# Patient Record
Sex: Female | Born: 1945 | Race: White | Hispanic: No | Smoking: Never smoker
Health system: Southern US, Community
[De-identification: ages and names within clinical notes are randomized; demographics above are authoritative.]

## PROBLEM LIST (undated history)

## (undated) DIAGNOSIS — I4891 Unspecified atrial fibrillation: Secondary | ICD-10-CM

## (undated) DIAGNOSIS — Z853 Personal history of malignant neoplasm of breast: Secondary | ICD-10-CM

## (undated) DIAGNOSIS — C801 Malignant (primary) neoplasm, unspecified: Secondary | ICD-10-CM

## (undated) DIAGNOSIS — R002 Palpitations: Secondary | ICD-10-CM

## (undated) DIAGNOSIS — I499 Cardiac arrhythmia, unspecified: Secondary | ICD-10-CM

## (undated) DIAGNOSIS — M1990 Unspecified osteoarthritis, unspecified site: Secondary | ICD-10-CM

## (undated) DIAGNOSIS — F419 Anxiety disorder, unspecified: Secondary | ICD-10-CM

## (undated) DIAGNOSIS — E785 Hyperlipidemia, unspecified: Secondary | ICD-10-CM

## (undated) DIAGNOSIS — E669 Obesity, unspecified: Secondary | ICD-10-CM

## (undated) DIAGNOSIS — K5790 Diverticulosis of intestine, part unspecified, without perforation or abscess without bleeding: Secondary | ICD-10-CM

## (undated) DIAGNOSIS — Z9889 Other specified postprocedural states: Secondary | ICD-10-CM

## (undated) DIAGNOSIS — R112 Nausea with vomiting, unspecified: Secondary | ICD-10-CM

## (undated) DIAGNOSIS — T8859XA Other complications of anesthesia, initial encounter: Secondary | ICD-10-CM

## (undated) DIAGNOSIS — K635 Polyp of colon: Secondary | ICD-10-CM

## (undated) HISTORY — DX: Polyp of colon: K63.5

## (undated) HISTORY — PX: TOTAL ABDOMINAL HYSTERECTOMY W/ BILATERAL SALPINGOOPHORECTOMY: SHX83

## (undated) HISTORY — DX: Personal history of malignant neoplasm of breast: Z85.3

## (undated) HISTORY — PX: OTHER SURGICAL HISTORY: SHX169

## (undated) HISTORY — DX: Unspecified osteoarthritis, unspecified site: M19.90

## (undated) HISTORY — PX: BREAST BIOPSY: SHX20

## (undated) HISTORY — DX: Malignant (primary) neoplasm, unspecified: C80.1

## (undated) HISTORY — PX: CT RADIATION THERAPY GUIDE: HXRAD513

## (undated) HISTORY — DX: Diverticulosis of intestine, part unspecified, without perforation or abscess without bleeding: K57.90

## (undated) HISTORY — DX: Anxiety disorder, unspecified: F41.9

## (undated) HISTORY — DX: Hyperlipidemia, unspecified: E78.5

## (undated) HISTORY — PX: CHOLECYSTECTOMY: SHX55

## (undated) HISTORY — DX: Obesity, unspecified: E66.9

## (undated) HISTORY — PX: ABDOMINAL HYSTERECTOMY: SHX81

## (undated) HISTORY — DX: Palpitations: R00.2

## (undated) HISTORY — PX: KNEE ARTHROSCOPY: SUR90

---

## 1999-08-20 ENCOUNTER — Other Ambulatory Visit: Admission: RE | Admit: 1999-08-20 | Discharge: 1999-08-20 | Payer: Self-pay | Admitting: Obstetrics and Gynecology

## 2000-04-29 ENCOUNTER — Encounter: Payer: Self-pay | Admitting: Internal Medicine

## 2000-10-27 ENCOUNTER — Other Ambulatory Visit: Admission: RE | Admit: 2000-10-27 | Discharge: 2000-10-27 | Payer: Self-pay | Admitting: Obstetrics and Gynecology

## 2002-01-25 ENCOUNTER — Other Ambulatory Visit: Admission: RE | Admit: 2002-01-25 | Discharge: 2002-01-25 | Payer: Self-pay | Admitting: Obstetrics and Gynecology

## 2002-08-04 ENCOUNTER — Ambulatory Visit (HOSPITAL_COMMUNITY): Admission: RE | Admit: 2002-08-04 | Discharge: 2002-08-04 | Payer: Self-pay | Admitting: Obstetrics and Gynecology

## 2002-08-04 ENCOUNTER — Encounter (INDEPENDENT_AMBULATORY_CARE_PROVIDER_SITE_OTHER): Payer: Self-pay | Admitting: *Deleted

## 2003-04-24 ENCOUNTER — Ambulatory Visit (HOSPITAL_BASED_OUTPATIENT_CLINIC_OR_DEPARTMENT_OTHER): Admission: RE | Admit: 2003-04-24 | Discharge: 2003-04-24 | Payer: Self-pay | Admitting: Orthopedic Surgery

## 2004-04-10 ENCOUNTER — Other Ambulatory Visit: Admission: RE | Admit: 2004-04-10 | Discharge: 2004-04-10 | Payer: Self-pay | Admitting: Obstetrics and Gynecology

## 2005-05-13 ENCOUNTER — Other Ambulatory Visit: Admission: RE | Admit: 2005-05-13 | Discharge: 2005-05-13 | Payer: Self-pay | Admitting: Obstetrics and Gynecology

## 2006-11-23 HISTORY — PX: OOPHORECTOMY: SHX86

## 2007-06-17 ENCOUNTER — Encounter: Admission: RE | Admit: 2007-06-17 | Discharge: 2007-06-17 | Payer: Self-pay | Admitting: General Surgery

## 2007-06-17 ENCOUNTER — Ambulatory Visit (HOSPITAL_COMMUNITY): Admission: RE | Admit: 2007-06-17 | Discharge: 2007-06-17 | Payer: Self-pay | Admitting: General Surgery

## 2007-06-23 ENCOUNTER — Encounter (INDEPENDENT_AMBULATORY_CARE_PROVIDER_SITE_OTHER): Payer: Self-pay | Admitting: Diagnostic Radiology

## 2007-06-23 ENCOUNTER — Encounter: Payer: Self-pay | Admitting: Internal Medicine

## 2007-06-23 ENCOUNTER — Encounter: Admission: RE | Admit: 2007-06-23 | Discharge: 2007-06-23 | Payer: Self-pay | Admitting: Obstetrics and Gynecology

## 2007-07-26 ENCOUNTER — Encounter: Admission: RE | Admit: 2007-07-26 | Discharge: 2007-07-26 | Payer: Self-pay | Admitting: General Surgery

## 2007-07-26 ENCOUNTER — Encounter (INDEPENDENT_AMBULATORY_CARE_PROVIDER_SITE_OTHER): Payer: Self-pay | Admitting: General Surgery

## 2007-07-26 ENCOUNTER — Ambulatory Visit (HOSPITAL_COMMUNITY): Admission: RE | Admit: 2007-07-26 | Discharge: 2007-07-27 | Payer: Self-pay | Admitting: General Surgery

## 2007-07-26 ENCOUNTER — Encounter: Payer: Self-pay | Admitting: Internal Medicine

## 2007-07-26 HISTORY — PX: BREAST LUMPECTOMY: SHX2

## 2007-08-01 ENCOUNTER — Encounter (INDEPENDENT_AMBULATORY_CARE_PROVIDER_SITE_OTHER): Payer: Self-pay | Admitting: General Surgery

## 2007-08-02 ENCOUNTER — Ambulatory Visit: Payer: Self-pay | Admitting: Oncology

## 2007-08-17 LAB — COMPREHENSIVE METABOLIC PANEL
AST: 20 U/L (ref 0–37)
Albumin: 4.4 g/dL (ref 3.5–5.2)
BUN: 16 mg/dL (ref 6–23)
Calcium: 9.7 mg/dL (ref 8.4–10.5)
Chloride: 104 mEq/L (ref 96–112)
Creatinine, Ser: 0.75 mg/dL (ref 0.40–1.20)
Glucose, Bld: 83 mg/dL (ref 70–99)
Potassium: 4.2 mEq/L (ref 3.5–5.3)

## 2007-08-17 LAB — CBC WITH DIFFERENTIAL/PLATELET
Basophils Absolute: 0.1 10*3/uL (ref 0.0–0.1)
EOS%: 7 % (ref 0.0–7.0)
Eosinophils Absolute: 0.4 10*3/uL (ref 0.0–0.5)
HCT: 40 % (ref 34.8–46.6)
HGB: 14.2 g/dL (ref 11.6–15.9)
MCH: 31.8 pg (ref 26.0–34.0)
MCV: 89.4 fL (ref 81.0–101.0)
MONO%: 6.4 % (ref 0.0–13.0)
NEUT#: 2.1 10*3/uL (ref 1.5–6.5)
NEUT%: 42.7 % (ref 39.6–76.8)
RDW: 13.6 % (ref 11.3–14.5)
lymph#: 2.1 10*3/uL (ref 0.9–3.3)

## 2007-08-17 LAB — CANCER ANTIGEN 27.29: CA 27.29: 19 U/mL (ref 0–39)

## 2007-08-23 ENCOUNTER — Ambulatory Visit: Admission: RE | Admit: 2007-08-23 | Discharge: 2007-11-01 | Payer: Self-pay | Admitting: Radiation Oncology

## 2007-12-01 ENCOUNTER — Ambulatory Visit: Payer: Self-pay | Admitting: Oncology

## 2007-12-05 LAB — CBC WITH DIFFERENTIAL/PLATELET
Basophils Absolute: 0 10*3/uL (ref 0.0–0.1)
EOS%: 3.6 % (ref 0.0–7.0)
Eosinophils Absolute: 0.1 10*3/uL (ref 0.0–0.5)
HGB: 14.5 g/dL (ref 11.6–15.9)
MONO#: 0.3 10*3/uL (ref 0.1–0.9)
NEUT#: 1.9 10*3/uL (ref 1.5–6.5)
RDW: 13.1 % (ref 11.3–14.5)
WBC: 3.4 10*3/uL — ABNORMAL LOW (ref 3.9–10.0)
lymph#: 1.1 10*3/uL (ref 0.9–3.3)

## 2007-12-05 LAB — COMPREHENSIVE METABOLIC PANEL
AST: 21 U/L (ref 0–37)
Albumin: 4.3 g/dL (ref 3.5–5.2)
BUN: 13 mg/dL (ref 6–23)
Calcium: 9.6 mg/dL (ref 8.4–10.5)
Chloride: 105 mEq/L (ref 96–112)
Potassium: 4.1 mEq/L (ref 3.5–5.3)

## 2007-12-11 LAB — VITAMIN D PNL(25-HYDRXY+1,25-DIHY)-BLD: Vit D, 25-Hydroxy: 20 ng/mL — ABNORMAL LOW (ref 30–89)

## 2008-01-30 ENCOUNTER — Encounter: Payer: Self-pay | Admitting: Internal Medicine

## 2008-01-31 ENCOUNTER — Ambulatory Visit: Payer: Self-pay | Admitting: Oncology

## 2008-02-02 LAB — CBC WITH DIFFERENTIAL/PLATELET
BASO%: 0.5 % (ref 0.0–2.0)
EOS%: 3.1 % (ref 0.0–7.0)
Eosinophils Absolute: 0.2 10*3/uL (ref 0.0–0.5)
HGB: 14.5 g/dL (ref 11.6–15.9)
LYMPH%: 27.8 % (ref 14.0–48.0)
MCV: 88.5 fL (ref 81.0–101.0)
NEUT#: 3 10*3/uL (ref 1.5–6.5)
NEUT%: 61.4 % (ref 39.6–76.8)
RDW: 13.5 % (ref 11.3–14.5)

## 2008-02-02 LAB — COMPREHENSIVE METABOLIC PANEL
ALT: 26 U/L (ref 0–35)
AST: 24 U/L (ref 0–37)
CO2: 29 mEq/L (ref 19–32)
Creatinine, Ser: 0.73 mg/dL (ref 0.40–1.20)
Sodium: 140 mEq/L (ref 135–145)
Total Bilirubin: 0.7 mg/dL (ref 0.3–1.2)
Total Protein: 7.1 g/dL (ref 6.0–8.3)

## 2008-02-03 LAB — VITAMIN D 25 HYDROXY (VIT D DEFICIENCY, FRACTURES): Vit D, 25-Hydroxy: 21 ng/mL — ABNORMAL LOW (ref 30–89)

## 2008-05-01 ENCOUNTER — Ambulatory Visit: Payer: Self-pay | Admitting: Oncology

## 2008-05-07 ENCOUNTER — Encounter: Admission: RE | Admit: 2008-05-07 | Discharge: 2008-05-07 | Payer: Self-pay | Admitting: General Surgery

## 2008-06-15 ENCOUNTER — Ambulatory Visit: Payer: Self-pay | Admitting: Oncology

## 2008-06-20 LAB — CBC WITH DIFFERENTIAL/PLATELET
Basophils Absolute: 0 10*3/uL (ref 0.0–0.1)
Eosinophils Absolute: 0 10*3/uL (ref 0.0–0.5)
HGB: 14.4 g/dL (ref 11.6–15.9)
LYMPH%: 28.2 % (ref 14.0–48.0)
MCV: 91.3 fL (ref 81.0–101.0)
MONO%: 6.7 % (ref 0.0–13.0)
NEUT#: 2.8 10*3/uL (ref 1.5–6.5)
Platelets: 238 10*3/uL (ref 145–400)
RBC: 4.6 10*6/uL (ref 3.70–5.32)

## 2008-06-20 LAB — LIPID PANEL
Cholesterol: 274 mg/dL — ABNORMAL HIGH (ref 0–200)
VLDL: 61 mg/dL — ABNORMAL HIGH (ref 0–40)

## 2008-06-21 LAB — COMPREHENSIVE METABOLIC PANEL
Alkaline Phosphatase: 50 U/L (ref 39–117)
BUN: 16 mg/dL (ref 6–23)
Creatinine, Ser: 0.79 mg/dL (ref 0.40–1.20)
Glucose, Bld: 90 mg/dL (ref 70–99)
Total Bilirubin: 0.4 mg/dL (ref 0.3–1.2)

## 2008-06-21 LAB — VITAMIN D 25 HYDROXY (VIT D DEFICIENCY, FRACTURES): Vit D, 25-Hydroxy: 23 ng/mL — ABNORMAL LOW (ref 30–89)

## 2008-06-21 LAB — CANCER ANTIGEN 27.29: CA 27.29: 21 U/mL (ref 0–39)

## 2008-12-18 ENCOUNTER — Ambulatory Visit: Payer: Self-pay | Admitting: Oncology

## 2008-12-20 LAB — CBC WITH DIFFERENTIAL/PLATELET
Basophils Absolute: 0 10*3/uL (ref 0.0–0.1)
Eosinophils Absolute: 0.1 10*3/uL (ref 0.0–0.5)
HCT: 41.6 % (ref 34.8–46.6)
LYMPH%: 32.4 % (ref 14.0–48.0)
MCV: 90.1 fL (ref 81.0–101.0)
MONO%: 7.8 % (ref 0.0–13.0)
NEUT#: 2.3 10*3/uL (ref 1.5–6.5)
NEUT%: 56.7 % (ref 39.6–76.8)
Platelets: 219 10*3/uL (ref 145–400)
RBC: 4.62 10*6/uL (ref 3.70–5.32)

## 2008-12-21 LAB — COMPREHENSIVE METABOLIC PANEL
ALT: 21 U/L (ref 0–35)
AST: 21 U/L (ref 0–37)
Calcium: 9.2 mg/dL (ref 8.4–10.5)
Chloride: 103 mEq/L (ref 96–112)
Creatinine, Ser: 0.81 mg/dL (ref 0.40–1.20)
Sodium: 139 mEq/L (ref 135–145)
Total Protein: 7 g/dL (ref 6.0–8.3)

## 2009-05-08 ENCOUNTER — Encounter: Admission: RE | Admit: 2009-05-08 | Discharge: 2009-05-08 | Payer: Self-pay | Admitting: General Surgery

## 2009-11-23 HISTORY — PX: BREAST BIOPSY: SHX20

## 2010-01-17 ENCOUNTER — Ambulatory Visit: Payer: Self-pay | Admitting: Oncology

## 2010-01-21 ENCOUNTER — Encounter: Payer: Self-pay | Admitting: Internal Medicine

## 2010-01-21 LAB — CBC WITH DIFFERENTIAL/PLATELET
Basophils Absolute: 0 10*3/uL (ref 0.0–0.1)
Eosinophils Absolute: 0.1 10*3/uL (ref 0.0–0.5)
HGB: 14.5 g/dL (ref 11.6–15.9)
MCHC: 34 g/dL (ref 31.5–36.0)
MONO#: 0.5 10*3/uL (ref 0.1–0.9)
MONO%: 7 % (ref 0.0–14.0)
RDW: 14.4 % (ref 11.2–14.5)
WBC: 6.7 10*3/uL (ref 3.9–10.3)
lymph#: 1.9 10*3/uL (ref 0.9–3.3)

## 2010-01-21 LAB — COMPREHENSIVE METABOLIC PANEL
Alkaline Phosphatase: 71 U/L (ref 39–117)
BUN: 17 mg/dL (ref 6–23)
Glucose, Bld: 86 mg/dL (ref 70–99)
Total Bilirubin: 0.4 mg/dL (ref 0.3–1.2)

## 2010-01-24 ENCOUNTER — Encounter: Payer: Self-pay | Admitting: Internal Medicine

## 2010-01-25 ENCOUNTER — Encounter: Payer: Self-pay | Admitting: Internal Medicine

## 2010-01-27 ENCOUNTER — Encounter: Payer: Self-pay | Admitting: Internal Medicine

## 2010-03-27 DIAGNOSIS — R002 Palpitations: Secondary | ICD-10-CM | POA: Insufficient documentation

## 2010-03-28 ENCOUNTER — Ambulatory Visit: Payer: Self-pay | Admitting: Internal Medicine

## 2010-03-28 DIAGNOSIS — E785 Hyperlipidemia, unspecified: Secondary | ICD-10-CM | POA: Insufficient documentation

## 2010-04-01 ENCOUNTER — Encounter: Payer: Self-pay | Admitting: Internal Medicine

## 2010-04-09 ENCOUNTER — Telehealth: Payer: Self-pay | Admitting: Internal Medicine

## 2010-04-14 ENCOUNTER — Encounter: Payer: Self-pay | Admitting: Internal Medicine

## 2010-04-16 ENCOUNTER — Encounter: Payer: Self-pay | Admitting: Internal Medicine

## 2010-04-16 ENCOUNTER — Ambulatory Visit: Payer: Self-pay

## 2010-05-12 ENCOUNTER — Encounter: Admission: RE | Admit: 2010-05-12 | Discharge: 2010-05-12 | Payer: Self-pay | Admitting: General Surgery

## 2010-05-16 ENCOUNTER — Encounter: Admission: RE | Admit: 2010-05-16 | Discharge: 2010-05-16 | Payer: Self-pay | Admitting: General Surgery

## 2010-05-29 ENCOUNTER — Encounter: Admission: RE | Admit: 2010-05-29 | Discharge: 2010-05-29 | Payer: Self-pay | Admitting: General Surgery

## 2010-05-30 ENCOUNTER — Telehealth: Payer: Self-pay | Admitting: Internal Medicine

## 2010-06-20 ENCOUNTER — Encounter (INDEPENDENT_AMBULATORY_CARE_PROVIDER_SITE_OTHER): Payer: Self-pay | Admitting: *Deleted

## 2010-08-19 ENCOUNTER — Encounter: Payer: Self-pay | Admitting: Internal Medicine

## 2010-09-18 ENCOUNTER — Ambulatory Visit: Payer: Self-pay

## 2010-09-18 ENCOUNTER — Ambulatory Visit: Payer: Self-pay | Admitting: Internal Medicine

## 2010-11-23 DIAGNOSIS — C801 Malignant (primary) neoplasm, unspecified: Secondary | ICD-10-CM

## 2010-11-23 HISTORY — PX: OTHER SURGICAL HISTORY: SHX169

## 2010-11-23 HISTORY — DX: Malignant (primary) neoplasm, unspecified: C80.1

## 2010-11-28 ENCOUNTER — Encounter: Payer: Self-pay | Admitting: Internal Medicine

## 2010-11-28 ENCOUNTER — Ambulatory Visit
Admission: RE | Admit: 2010-11-28 | Discharge: 2010-11-28 | Payer: Self-pay | Source: Home / Self Care | Attending: Internal Medicine | Admitting: Internal Medicine

## 2010-12-01 ENCOUNTER — Ambulatory Visit: Admission: RE | Admit: 2010-12-01 | Discharge: 2010-12-01 | Payer: Self-pay | Source: Home / Self Care

## 2010-12-01 ENCOUNTER — Ambulatory Visit (HOSPITAL_COMMUNITY)
Admission: RE | Admit: 2010-12-01 | Discharge: 2010-12-01 | Payer: Self-pay | Source: Home / Self Care | Attending: Internal Medicine | Admitting: Internal Medicine

## 2010-12-01 ENCOUNTER — Encounter: Payer: Self-pay | Admitting: Internal Medicine

## 2010-12-01 ENCOUNTER — Encounter (INDEPENDENT_AMBULATORY_CARE_PROVIDER_SITE_OTHER): Payer: Self-pay | Admitting: *Deleted

## 2010-12-14 ENCOUNTER — Encounter: Payer: Self-pay | Admitting: General Surgery

## 2010-12-21 LAB — CONVERTED CEMR LAB: T3, Total: 74.6 ng/dL — ABNORMAL LOW (ref 80.0–204.0)

## 2010-12-23 NOTE — Letter (Signed)
Summary: [facility]  [facility]   Imported By: Roderic Ovens 04/29/2010 12:44:19  _____________________________________________________________________  External Attachment:    Type:   Image     Comment:   External Document

## 2010-12-23 NOTE — Assessment & Plan Note (Signed)
Summary: np6/ pt has medicare, bcbs / dx: palps couple of months/gd   Referring Provider:  Dr. Darnelle Catalan Primary Provider:  Dr. 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 Referred by Dr. Darnelle Catalan for further evaluation of palpitations  Denies any h/o known heart disease. Never had or stress test or cath.   Two months ago before going to see Dr. Darnelle Catalan for f/u was having palpitations. Was nervous about seeing him. Had very brief palpitations. Worse with stress. Gets sweaty with them and can feel it in her throat. Takes pulse and HR typically in 70s. Never goes fast or gets irregular. nNo prolonged episodses. no syncope or presyncope.  No episodes for for 2 weeks now.   Previously was drinking lots of caffeine. Cut back about 2 weeks ago on Dr. Darrall Dears advice. Now drinks half and half. Palpitations much better.  Husband says she doesn't snore. No ETOH.  Walks with husband 30 minutes a day with husband without problem. Occasionally strength training at [employer].   Recent lipids TC 271 TG 141 LDL 160 HDL 82. Unable to tolerate any cholesterol meds.  Had echo in 2001 (10 years ago)  EF 65-70% mild TR, trivial MR  Current Medications (verified): 1)  Meclizine Hcl 25 Mg Chew (Meclizine Hcl) .... As Needed 2)  Calcium 600 Mg Tabs (Calcium) .... Two Times A Day 3)  Vitamin D 1000 Unit Tabs (Cholecalciferol) .... 2 Tabs Two Times A Day 4)  Ester-C 1000-50 Mg Tabs (Bioflavonoid Products) .... 1/2 Tab Two Times A Day  Allergies (verified): 1)  ! Tetracycline 2)  ! Neosporin  Past History:  Past Medical History: 1. Palpatations 2. Hx of breast cancer s/p lumpectomy and HRT 3. Hyperlipidemia, intolerant of all cholesterol meds 4. Obesity 5. Anxiery  Past Surgical History: L breast lumpectomy L knee score Cholecystectomy TAH and BSO  Family History: Reviewed history and no changes  required. Mother died 27 cancer Father died 37 cancer No family h/o premature CAD  Social History: Reviewed history and no changes required. Retired Engineer, civil (consulting). No tobacco ETOH. Married 3 kids  Review of Systems       As per HPI and past medical history; otherwise all systems negative.   Vital Signs:  Patient profile:   65 year old female Height:      63 inches Weight:      199 pounds BMI:     35.38 Pulse rate:   80 / minute BP sitting:   118 / 78  (right arm)  Vitals Entered By: Laurance Flatten CMA (Mar 28, 2010 3:41 PM)  Physical Exam  General:  Gen: well appearing. no resp difficulty HEENT: normal Neck: supple. no JVD. Carotids 2+ bilat; no bruits. No lymphadenopathy or thryomegaly appreciated. Cor: PMI nondisplaced. Regular rate & rhythm. No rubs, murmur. +s4 Lungs: clear Abdomen: soft, nontender, nondistended.  Extremities: no cyanosis, clubbing, rash, edema Neuro: alert & orientedx3, cranial nerves grossly intact. moves all 4 extremities w/o difficulty. affect pleasant    Impression & Recommendations:  Problem # 1:  PALPITATIONS (ICD-785.1) Likely PACs and PVCs. Much improved with decreasing caffeine. Reassured her that these were likley benign. If recure we will place a 2 week event monitor to further evaluate. Check TSH. Needs routine stress test for screening purposes.   Problem # 2:  HYPERLIPIDEMIA-MIXED (ICD-272.4) LDL elevated. Given intolerance to previous meds will try red yeast Mihalik 1 tablet  once daily. Can titrate to 1 two times a day.   Other Orders: T-TSH [phone]) T-T4, Free [phone]) T- * Misc. Laboratory test [phone]) Treadmill (Treadmill)  Patient Instructions: 1)  Labs today 2)  Your physician has requested that you have an exercise tolerance test.  For further information please visit [email or web address]  Please also follow instruction sheet, as given. 3)  Call if you want to get Event Monitor 4)  Start Red Yeast Cardiff 1 tab daily, can  increase to 1 tab two times a day as needed  Appended Document: np6/ pt has medicare, bcbs / dx: palps couple of months/gd NSR 80. normal axis and intervals. No ST-T wave abnormalities.

## 2010-12-23 NOTE — Progress Notes (Signed)
Summary: pt has palpitations and questions  Phone Note Call from Patient Call back at Home Phone [phone]   Caller: Patient Reason for Call: Talk to Nurse, Talk to Doctor Summary of Call: pt would like to find out about labwork,moniter and she continues to have heart palpitations. When pt woke up she was hurting in he shoulder blade and across her back she is going to see her pcp at 2pm today Initial call taken by: Omer Jack,  Apr 09, 2010 12:33 PM  Follow-up for Phone Call        spoke w/pt she saw Rhodia Albright today ? touch of pleurasy, she was started on antibiotic, continues to have palps will check on getting pt monitor asap Meredith Staggers, RN  Apr 09, 2010 5:52 PM

## 2010-12-23 NOTE — Letter (Signed)
Summary: [facility] Progress [facility]  [facility]   Imported By: Roderic Ovens 04/29/2010 12:43:06  _____________________________________________________________________  External Attachment:    Type:   Image     Comment:   External Document

## 2010-12-23 NOTE — Letter (Signed)
Summary: Appointment - Missed  [facility], Main Office  [address]   [place], [place] [address]   Phone: [phone]  Fax: [phone]     June 20, 2010 MRN: [id number]   [facility] [address] [place], [place]  [address]   Dear Ms. HENION,  Our records indicate you missed your appointment on 06/19/2010  with  Dr. Gala Romney   It is very important that we reach you to reschedule this appointment. We look forward to participating in your health care needs. Please contact us at the number listed above at your earliest convenience to reschedule this appointment.     Sincerely,   Lorne Skeens  [facility] Scheduling Team

## 2010-12-23 NOTE — Letter (Signed)
Summary: [facility]   [facility] Cardiology & Internal Medicine   Imported By: Roderic Ovens 04/23/2010 11:26:45  _____________________________________________________________________  External Attachment:    Type:   Image     Comment:   External Document

## 2010-12-23 NOTE — Progress Notes (Signed)
Summary: monitor results  Phone Note Outgoing Call   Call placed by: Meredith Staggers, RN,  May 30, 2010 3:09 PM Call placed to: Patient Summary of Call: called pt w/monitor results, SR w/occ PACs, pt aware

## 2010-12-23 NOTE — Procedures (Signed)
Summary: Summary Report  Summary Report   Imported By: Erle Crocker 06/05/2010 11:01:52  _____________________________________________________________________  External Attachment:    Type:   Image     Comment:   External Document

## 2010-12-25 NOTE — Assessment & Plan Note (Signed)
Summary: per check ou/sf   Visit Type:  Follow-up Referring Provider:  Dr. Darnelle Catalan Primary Provider:  Dr. Collier Flowers  CC:  no complaints.  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 Referred by Dr. Darnelle Catalan last year for further evaluation of palpitations and CP.   Holter in May 2011. Sinus with PACs. Thyroid panel normal.   Had ETT in 10/11. Walked 6:30 on Bruce protocol. Normal ECG. Palpitations improved with decreasing caffeine but still gets at times - much worse with stress. Palpitations resolved when husband out of town for 2 weeks.  No prolonged palpitations.  No syncope.  Not walking as much recently due to throwing out her back.  Had several episdoes of GRED-like symptoms last month and changed way she eats and now better.      Current Medications (verified): 1)  Meclizine Hcl 25 Mg Chew (Meclizine Hcl) .... As Needed 2)  Cholesterol Reduction Complex .Marland Kitchen.. 2 Once Daily  (Shaklee Brand)  Allergies (verified): 1)  ! Tetracycline 2)  ! Neosporin  Past History:  Past Medical History: 1. Palpatations 2. Hx of breast cancer s/p lumpectomy and HRT 3. Hyperlipidemia, intolerant of all cholesterol meds 4. Obesity 5. Anxiery 6. CP    --ETT 10/11: walked 630 on Bruce protocl. normal ECG  Review of Systems       As per HPI and past medical history; otherwise all systems negative.   Vital Signs:  Patient profile:   65 year old female Height:      63 inches Weight:      198.75 pounds BMI:     35.33 Pulse rate:   58 / minute BP sitting:   124 / 86  (left arm) Cuff size:   regular  Vitals Entered By: Hardin Negus, RMA (November 28, 2010 11:03 AM)  Physical Exam  General:  Well appearing. no resp difficulty HEENT: normal Neck: supple. no JVD. Carotids 2+ bilat; no bruits. No lymphadenopathy or thryomegaly appreciated. Cor: PMI nondisplaced. Regular rate & rhythm. No rubs, murmur.  +s4 Lungs: clear Abdomen: soft, nontender, nondistended.  Extremities: no cyanosis, clubbing, rash, edema Neuro: alert & orientedx3, cranial nerves grossly intact. moves all 4 extremities w/o difficulty. affect pleasant    Impression & Recommendations:  Problem # 1:  PALPITATIONS (ICD-785.1) Monitor shows PACs. Likely caffeine and stress related. Continue with behavioral modification and stress reduction. Will check echo given h/o XRT. ETT reassuring but did encourage to continue walking program to improve her exercise capacity.   Other Orders: EKG w/ Interpretation (93000) Echocardiogram (Echo) Holter (Holter)  Patient Instructions: 1)  Your physician has requested that you have an echocardiogram.  Echocardiography is a painless test that uses sound waves to create images of your heart. It provides your doctor with information about the size and shape of your heart and how well your heart's chambers and valves are working.  This procedure takes approximately one hour. There are no restrictions for this procedure. 2)  Your physician has recommended that you wear a holter monitor.  Holter monitors are medical devices that record the heart's electrical activity. Doctors most often use these monitors to diagnose arrhythmias. Arrhythmias are problems with the speed or rhythm of the heartbeat. The monitor is a small, portable device. You can wear one while you do your normal daily activities. This is usually used to diagnose what is causing palpitations/syncope (passing out).

## 2010-12-25 NOTE — Letter (Signed)
Summary: Outpatient Coinsurance Notice  Outpatient Coinsurance Notice   Imported By: Marylou Mccoy 12/04/2010 08:25:10  _____________________________________________________________________  External Attachment:    Type:   Image     Comment:   External Document

## 2011-01-23 ENCOUNTER — Encounter (HOSPITAL_BASED_OUTPATIENT_CLINIC_OR_DEPARTMENT_OTHER): Payer: Medicare Other | Admitting: Oncology

## 2011-01-23 ENCOUNTER — Other Ambulatory Visit: Payer: Self-pay | Admitting: Oncology

## 2011-01-23 DIAGNOSIS — Z170 Estrogen receptor positive status [ER+]: Secondary | ICD-10-CM

## 2011-01-23 DIAGNOSIS — C50419 Malignant neoplasm of upper-outer quadrant of unspecified female breast: Secondary | ICD-10-CM

## 2011-01-23 LAB — CBC WITH DIFFERENTIAL/PLATELET
Eosinophils Absolute: 0.1 10*3/uL (ref 0.0–0.5)
HCT: 41.9 % (ref 34.8–46.6)
HGB: 14.1 g/dL (ref 11.6–15.9)
LYMPH%: 34.9 % (ref 14.0–49.7)
MONO#: 0.3 10*3/uL (ref 0.1–0.9)
NEUT#: 2.7 10*3/uL (ref 1.5–6.5)
NEUT%: 56.6 % (ref 38.4–76.8)
Platelets: 234 10*3/uL (ref 145–400)
RBC: 4.65 10*6/uL (ref 3.70–5.45)
WBC: 4.7 10*3/uL (ref 3.9–10.3)

## 2011-01-24 LAB — COMPREHENSIVE METABOLIC PANEL
CO2: 28 mEq/L (ref 19–32)
Glucose, Bld: 76 mg/dL (ref 70–99)
Sodium: 143 mEq/L (ref 135–145)
Total Bilirubin: 0.3 mg/dL (ref 0.3–1.2)
Total Protein: 7.3 g/dL (ref 6.0–8.3)

## 2011-01-24 LAB — VITAMIN D 25 HYDROXY (VIT D DEFICIENCY, FRACTURES): Vit D, 25-Hydroxy: 24 ng/mL — ABNORMAL LOW (ref 30–89)

## 2011-01-29 ENCOUNTER — Encounter (HOSPITAL_BASED_OUTPATIENT_CLINIC_OR_DEPARTMENT_OTHER): Payer: Medicare Other | Admitting: Oncology

## 2011-01-29 DIAGNOSIS — C50419 Malignant neoplasm of upper-outer quadrant of unspecified female breast: Secondary | ICD-10-CM

## 2011-01-29 DIAGNOSIS — Z170 Estrogen receptor positive status [ER+]: Secondary | ICD-10-CM

## 2011-04-07 NOTE — Op Note (Signed)
NAME:  [name], [name]                  ACCOUNT NO.:  [id number]   MEDICAL RECORD NO.:  [id number]          PATIENT TYPE:  AMB   LOCATION:  SDS                          FACILITY:  [facility]   PHYSICIAN:  Ollen Gross. Vernell Morgans, M.D. 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   PROCEDURES PERFORMED:  Left breast needle-localized lumpectomy and  sentinel node biopsy with injection of blue dye.   SURGEON:  Dr. Chevis Pretty III.   ANESTHESIA:  General endotracheal.   PROCEDURE:  After informed consent was obtained, the patient was brought  to the operating room, placed in supine position on the operating room  table.  After adequate induction of general anesthesia, the patient's  left breast and axilla were prepped with Betadine and draped in the  usual sterile manner.  2 mL of methylene blue and 3 mL of injectable  saline were then injected in the subareolar position.  Earlier in the  day the patient had undergone subareolar injection of 1 mCi of  technetium sulfur colloid.  The breast was massaged for several minutes.  Neoprobe unit was then used to identify a hot spot of radioactivity in  the left axilla.  A small transverse incision was made overlying this  hot spot.  Dissection was carried down through this incision through the  skin and subcutaneous tissue sharply with electrocautery until the  axilla was entered using the Neoprobe to direct the dissection.  Blunt  hemostat dissection was carried out in the axilla until two hot blue  lymph nodes were identified.  The lymphatics of each of these lymph  nodes were clamped with hemostats, divided and ligated with 3-0 Vicryl  ties.  The ex vivo counts on sentinel node #1 were approximately 1000  and on  sentinel node #2 were approximately 150.  Both of them were hot  and blue.  No other hot spots or palpable adenopathy was identified.  The deep layer of the wound was then closed with interrupted 3-0 Vicryl  stitches and the skin was closed a running for Monocryl subcuticular  stitch.  Attention was then turned to the left breast.  A curvilinear  incision was made on the upper portion of the left breast around the  path of the wires.  This incision was carried down through the skin and  subcutaneous tissue sharply with electrocautery until the breast tissue  was entered.  A circular portion of breast tissue was then taken out  around the path of the two wires.  Once this was accomplished, the  specimen was then oriented with a short stitch being superior and long  stitch being lateral and sent for radiology and for pathology.  The  specimen radiograph showed both areas to be in the specimen.  Touch  preps on the margins were negative.  Touch preps on the sentinel nodes  were negative.  Hemostasis was achieved using Bovie electrocautery.  The  wound was irrigated with copious amounts of saline and then infiltrated  with 0.25% Marcaine.  The deep layer of the wound was closed with  interrupted 3-0 Vicryl stitches in the skin was closed with a running 4-  0 Monocryl subcuticular stitch.  A Dermabond dressing was then applied.  The patient tolerated the procedure well.  At the end of the case all  needle, sponge counts correct.  The patient was then awakened, taken  recovery in stable condition.      Ollen Gross. Vernell Morgans, M.D.  Electronically Signed     PST/MEDQ  D:  07/26/2007  T:  07/26/2007  Job:  [id number]

## 2011-04-07 NOTE — Op Note (Signed)
NAME:  [name] [name], [name] [name]                  ACCOUNT NO.:  [id number]   MEDICAL RECORD NO.:  [id number]          PATIENT TYPE:  OIB   LOCATION:  2550                         FACILITY:  [facility]   PHYSICIAN:  Miguel Aschoff, M.D.       DATE OF BIRTH:  [birth year]   DATE OF PROCEDURE:  07/26/2007  DATE OF DISCHARGE:                               OPERATIVE REPORT   PREOPERATIVE DIAGNOSES:  Pelvic mass, carcinoma in situ of breast.   POSTOPERATIVE DIAGNOSIS:  Large uterine pedunculated myoma.   PROCEDURE:  Laparoscopy with laparoscopic myomectomy, bilateral salpingo-  oophorectomy.   SURGEON:  Miguel Aschoff, M.D.   ASSISTANT:  Ollen Gross. Vernell Morgans, M.D.   ANESTHESIA:  General.   COMPLICATIONS:  None.   JUSTIFICATION:  The patient is a 65 year old white female recently  diagnosed as having carcinoma in situ of the breast, who is to undergo  lumpectomy and sentinel node biopsy by Dr. Carolynne Edouard.  The patient had a  prior cholecystectomy, and at the time of this surgery, the surgeon  noted that there was a mass in the pelvis, the exact nature of which was  not determined.  Because of this mass, she presents now to undergo  laparoscopy at the time of her lumpectomy in a effort to identify the  etiology of this mass, and, in addition, to proceed with bilateral  salpingo-oophorectomy.  The risks and benefits of these procedures were  discussed with the patient.   DESCRIPTION OF PROCEDURE:  The patient was taken to the operating room,  placed in the supine position and general anesthesia was administered  without difficulty.  She was then placed in dorsal lithotomy position  and prepped and draped in the usual sterile fashion.  A Foley catheter  was inserted.  At this point, a Hulka tenaculum was placed through the  cervix and held.  Attention was then directed to the umbilicus, where a  small infraumbilical incision was made.  A Veress needle was inserted  and then the abdomen was insufflated with 3 L of CO2.   Following the  insufflation, the trocar for the laparoscope was placed, followed by the  laparoscope itself.  Immediately on placing the laparoscope, it was  obvious that there was this large mass arising from what appeared to be  the right round ligament, and it appeared to be consistent with a large  myoma.  The uterus was inspected and was noted to have several smaller  myomas, and then, to allow better visualization, a 5-mm port was  established in the right lower quadrant under direct visualization and  an 11-mm port was established under direct visualization in the left  lower quadrant.  At this point, the mass was elevated and the base of  this pedunculated fibroid was then treated with the Gyrus unit by  cauterizing and cutting the pedicle and thus freeing this mass.  It was  approximately 8 cm in size.  Once the mass was freed, inspection was  made for hemostasis and hemostasis appeared to be excellent at the site  where this mass was  excised.  Attention was then directed to the  ovaries.  The right infundibulopelvic ligament was identified.  The  ureter was identified and the infundibulopelvic ligament was grasped  with the Gyrus unit, cauterized and then cut, and then dissection  continued along the mesovarian ligament and the mesentery of the  fallopian tube, with dissection carried immediately until the cornual  portion of the uterus was reached.  At this point, the Gyrus unit was  placed across the residual portion of the fallopian tube and  uteroovarian ligament.  This freed the right tube and ovary.  This  specimen was then placed in the cul-de-sac.  The ovary did appear to be  grossly normal and senescent, consistent with the patient's history of  menopause.  Attention was then directed to the left ovary.  Again, the  infundibulopelvic ligament was identified, grasped, cauterized, and  then, again, the dissection was carried medially until it was possible  to again excise  in toto the left tube and ovary.  The site of the  incision was then inspected.  Hemostasis appeared to be excellent.  At  this point, an EndoCatch unit was placed into the abdomen and specimens  of the fibroid, right tube and ovary and left tube and ovary were placed  in the EndoCatch bag, and this was brought out through the left lower  quadrant incision.  Because of the large size of the myoma, again,  approximately 8 cm in size, it was necessary to extend the left lower  quadrant incision and to identify the fascia and open it for  approximately 4 cm.  Once this was done, the EndoCatch bag was brought  up through this incision, opened, and then the large myoma was  morcellated and removed without difficulty.  In addition, the specimens  of the ovaries were removed without difficulty.  At this point, the  fascial incision was then closed using running, interlocking 0 Vicryl  suture.  After closing the fascia, the subcutaneous tissue was closed  using interrupted 0 Vicryl suture, and then the skin incision was closed  using subcuticular 3-0 Vicryl suture.  At this point, the abdomen was  reinflated to inspect for hemostasis.  Hemostasis appeared to be  excellent, and at this point, the laparoscopic instruments were removed  and the small incisions were closed using subcuticular 3-0 Vicryl.  It  should be noted that prior to beginning the dissection, peritoneal  washings were taken for histologic study.  The specimens consisting of  the myoma, the left tube and ovary and right tube and ovary and  peritoneal washings were sent to Pathology.  At this point, the  procedure was turned over to Dr. Carolynne Edouard, who proceeded to perform the  lumpectomy and sentinel node biopsy.  This is dictated in a separate  note.  The patient is to spend 23 hours for observation and will be  reassessed on 07/27/2007.      Miguel Aschoff, M.D.  Electronically Signed     AR/MEDQ  D:  07/26/2007  T:  07/26/2007   Job:  [id number]   cc:   Ollen Gross. Vernell Morgans, M.D.

## 2011-04-10 NOTE — Op Note (Signed)
NAME:  [name] [name], BURNSIDE                            ACCOUNT NO.:  [id number]   MEDICAL RECORD NO.:  [id number]                   PATIENT TYPE:  AMB   LOCATION:  SDC                                  FACILITY:  [facility]   PHYSICIAN:  Miguel Aschoff, M.D.                    DATE OF BIRTH:  [birth year]   DATE OF PROCEDURE:  08/04/2002  DATE OF DISCHARGE:                                 OPERATIVE REPORT   PREOPERATIVE DIAGNOSES:  Postmenopausal bleeding.   POSTOPERATIVE DIAGNOSES:  Postmenopausal bleeding.   PROCEDURE:  Cervical dilatation, hysteroscopy, uterine curettage.   SURGEON:  Miguel Aschoff, M.D.   ANESTHESIA:  IV sedation with paracervical block.   COMPLICATIONS:  None.   JUSTIFICATION:  The patient is a 65 year old white female several years into  menopause who developed the onset of vaginal bleeding.  She presents now to  undergo hysteroscopy and D&C to see if an etiology of the bleeding could be  established and to rule out any neoplastic process.  The risks and benefits  of the procedure have been discussed with the patient and informed consent  has been obtained.   PROCEDURE:  The patient was taken to the operating room, placed in a supine  position, and  IV sedation was administered without difficulty.  She was  then placed in the dorsal lithotomy position, prepped and draped in the  usual sterile fashion.  Examination was carried out which revealed normal  external female genitalia, normal Bartholin's and Skene's glands, normal  urethra.  Vaginal vault was without gross lesions.  Cervix was without gross  lesion.  Uterus was noted to be anteflexed with a right anterior fibroid  present approximately 4-5 cm in size.  The adnexa revealed no masses.  At  this point speculum was placed in the vaginal vault.  Anterior cervical lip  was grasped with a tenaculum and a paracervical block was placed using 20 cc  of 1% Xylocaine.  The endocervical canal was then dilated using serial Pratt  dilators until a number 23 Pratt dilator could be passed.  At this point the  diagnostic hysteroscope was advanced through the cervix.  The endocervical  canal was unremarkable.  Exploration of the uterine cavity revealed what  appeared to be a polyp present arising in the lower fundus from the right  posterior fundal wall.  The remainder of the uterine cavity was  unremarkable.  No other polyps were noted as the endometrium appeared to be  unremarkable.  At this point the hysteroscope was removed and vigorous  curettage was carried out using a medium sized serrated curette.  The tissue  removed was collected and sent for histologic study.  The hysteroscope was  then replaced into the uterus.  The polypoid lesion was found to have been  removed and no other abnormalities noted.  At this point the procedure was  completed.  All instruments were removed.  There was good hemostasis.  The  patient was reversed from the anesthetic and taken to the recovery room in  satisfactory condition.   Plan is for the patient to be discharged home.   DISCHARGE MEDICATIONS:  1. Cipro 250 mg b.i.d. x2 days.  2. Darvocet-N 100 one q.4-6h. as needed for pain.   DISCHARGE INSTRUCTIONS:  She is instructed to place nothing in the vagina,  to call if there are any problems such as fever, pain, or heavy bleeding,  and to call for pathology report on August 09, 2002.  She was sent home  in satisfactory condition.                                               Miguel Aschoff, M.D.    AR/MEDQ  D:  08/04/2002  T:  08/04/2002  Job:  [id number]

## 2011-04-10 NOTE — Op Note (Signed)
NAME:  [name] [name], [name] [name]                            ACCOUNT NO.:  [id number]   MEDICAL RECORD NO.:  [id number]                   PATIENT TYPE:  AMB   LOCATION:  DSC                                  FACILITY:  [facility]   PHYSICIAN:  Robert A. Thurston Hole, M.D.              DATE OF BIRTH:  1946/04/22   DATE OF PROCEDURE:  04/24/2003  DATE OF DISCHARGE:                                 OPERATIVE REPORT   PREOPERATIVE DIAGNOSES:  1. 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  2. Left knee patella lateral tracking.   POSTOPERATIVE DIAGNOSES:  1. 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  2. Left knee patella lateral tracking.   PROCEDURE:  1. Left knee examination under anesthesia followed by arthroscopic partial     medial meniscectomy.  2. Left knee tricompartmental chondroplasty.  3. Left knee lateral retinacular release.   SURGEON:  Elana Alm. Thurston Hole, M.D.   ASSISTANT:  Julien Girt, P.A.   ANESTHESIA:  Local with MAC.   OPERATIVE TIME:  30 minutes.   COMPLICATIONS:  None.   INDICATIONS FOR PROCEDURE:  [name] [name] is a 65 year old woman who has had  significant left knee pain for the past six months increasing in nature with  signs and symptoms consistent with a medial meniscal tear and  chondromalacia.  She has failed conservative care and is now to undergo  arthroscopy.   DESCRIPTION OF PROCEDURE:  [name] [name] is brought to the operating room on Apr 24, 2003 after a knee block had been placed in the holding area.  She was  placed on the operating table in supine position.  Her left knee was  examination under anesthesia.  Range of motion was 0 to 125 degrees.  1 to  2+ crepitation, knee stable to ligamentous exam with mild lateral patellar  tracking noted.  After this was done, the knee was prepped using sterile  DuraPrep and draped using sterile technique.  Originally through an  inferolateral portal, the arthroscope with a pump attached was placed and  through an inferomedial portal, an arthroscopic probe was placed.  On  initial inspection of the medial compartment, she had 30 to 40% grade 3  chondromalacia which was debrided.  Medial meniscal tear of the posterior  medial horn of 25% was resected back to a stable rim.  The intercondylar  notch was inspected.  The anterior and posterior cruciate ligaments were  normal.  The lateral compartment was inspected.  Articular cartilage was  intact.  The lateral meniscus was intact. The patellofemoral joint showed  50% grade 3 chondromalacia on the patella and femoral groove and this was  debrided.  Moderate lateral patellar tracking was noted which was amenable  to lateral retinacular release and then ArthroCare wand was used to perform  this lateral retinacular release.  No excessive bleeding was encountered.  This release was done which significantly improved patellar  tracking to  normal and decompressed the patellofemoral joint.  Medial and lateral  gutters were inspected.  They were found to have significant synovitis which  was debrided.  Otherwise they were free of pathology.  After this was done,  it was felt that all pathology had been satisfactorily addressed.  The  instruments were removed.  The portals were closed with 3-0 nylon suture and  injected with 0.25% Marcaine with epinephrine and 4 mg of morphine.  Sterile  dressings applied and the patient awakened and taken to recovery room in  stable condition.   FOLLOW UP:  [name] [name] will be followed as an outpatient on Vicodin and  Naprosyn.  See her back in the office in a week for suture removal and  follow-up.                                               Robert A. Thurston Hole, M.D.    RAW/MEDQ  D:  04/24/2003  T:  04/24/2003  Job:  [id number]

## 2011-04-21 ENCOUNTER — Telehealth: Payer: Self-pay | Admitting: Internal Medicine

## 2011-04-21 NOTE — Telephone Encounter (Signed)
Suspect these are PACs from stress. Can you pull her Holter from earlier this year for me to review? Recommend watching caffeine intake, reducing stress as possible, and getting enough rest. If palpitations continue to e severe can consider longer monitor but I dont think this will add much at this time.

## 2011-04-21 NOTE — Telephone Encounter (Signed)
Left message to call back on home #, [phone] cell # Spoke w/pt she states Sun AM she got up to go to the bathroom and had and episode of rapid and irreg heart beat it lasted for about an hour and then resolved on its own, since then she has felt ok but has noticed that her palps have returned she has not had any extra caffeine but has recently had surgery and has been under a lot of stress, explained this could be a trigger for her PAC's she would me to run this by Dr Gala Romney to make sure she doesn't need an appt or any test, will discuss w/him

## 2011-04-21 NOTE — Telephone Encounter (Signed)
Pt is having some SOB and sweats and wanted to talk about this/lg

## 2011-04-21 NOTE — Telephone Encounter (Signed)
Monitor from earlier only showed PACs, pt is aware of Dr Gala Romney recommendations she will let me know if not getting better

## 2011-05-04 ENCOUNTER — Other Ambulatory Visit (INDEPENDENT_AMBULATORY_CARE_PROVIDER_SITE_OTHER): Payer: Self-pay | Admitting: General Surgery

## 2011-05-04 DIAGNOSIS — Z853 Personal history of malignant neoplasm of breast: Secondary | ICD-10-CM

## 2011-05-04 DIAGNOSIS — Z9889 Other specified postprocedural states: Secondary | ICD-10-CM

## 2011-06-12 ENCOUNTER — Ambulatory Visit
Admission: RE | Admit: 2011-06-12 | Discharge: 2011-06-12 | Disposition: A | Discharge status: Home / Self Care | Payer: BLUE CROSS/BLUE SHIELD | Source: Ambulatory Visit | Attending: General Surgery | Admitting: General Surgery

## 2011-06-12 DIAGNOSIS — Z853 Personal history of malignant neoplasm of breast: Secondary | ICD-10-CM

## 2011-06-18 ENCOUNTER — Encounter: Payer: Self-pay | Admitting: Internal Medicine

## 2011-06-22 ENCOUNTER — Ambulatory Visit (INDEPENDENT_AMBULATORY_CARE_PROVIDER_SITE_OTHER): Payer: Medicare Other | Admitting: Internal Medicine

## 2011-06-22 ENCOUNTER — Encounter: Payer: Self-pay | Admitting: Internal Medicine

## 2011-06-22 VITALS — BP 138/88 | HR 84 | Resp 16 | Ht 63.0 in | Wt 189.0 lb

## 2011-06-22 DIAGNOSIS — R002 Palpitations: Secondary | ICD-10-CM

## 2011-06-22 DIAGNOSIS — R0602 Shortness of breath: Secondary | ICD-10-CM

## 2011-06-22 NOTE — Progress Notes (Signed)
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 Referred by Dr. Darnelle Catalan last year for further evaluation of palpitations and CP.   Holter in May 2011. Sinus with PACs. Thyroid panel normal  Had ETT in 10/11. Walked 6:30 on Bruce protocol. Normal ECG. Palpitations improved with decreasing caffeine but still gets at times - much worse with stress. Echo 55-60% with mild LVH. Moderately LAE. (no comment on diastolic function). Just got back from [facility]: [name] [name] (8,000 feet) seeing her two grandsons. No CP. Occasional dyspnea. Checks BP occasionally and systolics 120-130.  ROS: All systems negative except as listed in HPI, PMH and Problem List.  Past Medical History  Diagnosis Date  . Palpitations   . History of breast cancer     s/p lumpectomy and HRT  . Hyperlipidemia     intolerant of all cholesterol meds  . Obesity   . Anxiety     No current outpatient prescriptions on file.     PHYSICAL EXAM: Filed Vitals:   06/22/11 1628  BP: 138/88  Pulse: 84  Resp: 16   General:  Well appearing. No resp difficulty HEENT: normal Neck: supple. JVP flat. Carotids 2+ bilaterally; no bruits. No lymphadenopathy or thryomegaly appreciated. Cor: PMI normal. Regular rate & rhythm. No rubs, gallops or murmurs. Lungs: clear Abdomen: soft, nontender, nondistended. No hepatosplenomegaly. No bruits or masses. Good bowel sounds. Extremities: no cyanosis, clubbing, rash, edema Neuro: alert & orientedx3, cranial nerves grossly intact. Moves all 4 extremities [name]/o difficulty. Affect pleasant.    ECG: SR 84 with PACs   ASSESSMENT & PLAN:

## 2011-06-22 NOTE — Patient Instructions (Signed)
Your physician has requested that you regularly monitor and record your blood pressure readings at home. Please use the same machine at the same time of day to check your readings and record them, fax [place] the log in about 3 weeks to [phone].  Your physician recommends that you schedule a follow-up appointment in: 4 months.

## 2011-06-25 DIAGNOSIS — R0602 Shortness of breath: Secondary | ICD-10-CM | POA: Insufficient documentation

## 2011-06-25 NOTE — Assessment & Plan Note (Signed)
She has evidence of diastolic dysfunction on her echo with LVH and a moderately enlarged LA. We reviewed the pathogenesis of diastolic heart failure and the need for her to have tight BP control and exercise regularly. She will keep a BP log for several weeks and fax to [place]. I would have very low threshold to start anti-HTN therapy.

## 2011-06-25 NOTE — Assessment & Plan Note (Signed)
Continues with PACs. As above we discussed diastolic dysfunction and with increasing LAE she is at risk for progression to atrial fib. Will follow.

## 2011-07-28 ENCOUNTER — Ambulatory Visit (INDEPENDENT_AMBULATORY_CARE_PROVIDER_SITE_OTHER): Payer: Medicare Other | Admitting: General Surgery

## 2011-07-28 ENCOUNTER — Encounter (INDEPENDENT_AMBULATORY_CARE_PROVIDER_SITE_OTHER): Payer: Self-pay | Admitting: General Surgery

## 2011-07-28 VITALS — BP 122/72 | HR 64

## 2011-07-28 DIAGNOSIS — C50919 Malignant neoplasm of unspecified site of unspecified female breast: Secondary | ICD-10-CM

## 2011-07-28 DIAGNOSIS — D0511 Intraductal carcinoma in situ of right breast: Secondary | ICD-10-CM | POA: Insufficient documentation

## 2011-07-28 DIAGNOSIS — Z853 Personal history of malignant neoplasm of breast: Secondary | ICD-10-CM | POA: Insufficient documentation

## 2011-07-28 NOTE — Progress Notes (Signed)
Subjective:     Patient ID: [name] [name], female   DOB: 08-31-1946, 65 y.o.   MRN: [id number]  HPI The patient is a 65 year old white female who is now 4 years out from a left breast lumpectomy and negative sentinel node biopsy for a T1 B. N0 left breast cancer. She was ER positive PR negative and HER-2 negative. she has been off tamoxifen for over a year now. Since her last visit she had pretty extensive head and neck surgery by Dr. Dawna Part at [facility] to remove a lot of cylindromas. Shortly after her surgery she did notice an enlarged lymph node in her right neck. It is not tender. It actually seems to be getting smaller. She did have a CT scan of her neck that showed the lymph node but it was thought to not be pathologically enlarged.  Review of Systems     Objective:   Physical Exam On exam Lungs: Clear bilaterally with no use of accessory respiratory muscles Heart: Regular rate and rhythm with an impulse in the left chest Abdomen: Soft and nontender with no palpable mass or hepatosplenomegaly Breasts: No palpable mass in either breast. Her left breast incision has healed nicely. No axillary or supraclavicular lymphadenopathy. She has  one palpable mobile lymph node in the right neck that feels about a centimeter in size.    Assessment:     4 years out from a left breast lumpectomy and negative sentinel node biopsy    Plan:     I suspect the enlarged lymph node in the right neck may be a reactive lymph node secondary to her extensive head and neck surgery. We will plan to see her back in about 3 months to reexamine her and she will continue to keep a close eye on this lymph node. If it enlarges then it probably ought to be removed given her history of breast cancer.

## 2011-07-28 NOTE — Patient Instructions (Signed)
Continue regular self exams  

## 2011-08-19 ENCOUNTER — Telehealth (HOSPITAL_COMMUNITY): Payer: Self-pay | Admitting: *Deleted

## 2011-08-19 ENCOUNTER — Encounter: Payer: Self-pay | Admitting: Internal Medicine

## 2011-08-19 NOTE — Telephone Encounter (Signed)
Pt had sent in BP readings for Dr Gala Romney to review, running 103-132/68-82 per Dr Gala Romney looks great continue current regimen, pt is aware and agreeable

## 2011-08-27 ENCOUNTER — Other Ambulatory Visit: Payer: Self-pay | Admitting: Obstetrics and Gynecology

## 2011-09-04 LAB — DIFFERENTIAL
Basophils Absolute: 0
Basophils Relative: 1
Eosinophils Absolute: 0.1
Monocytes Absolute: 0.5
Monocytes Relative: 8
Neutro Abs: 4
Neutrophils Relative %: 59

## 2011-09-04 LAB — COMPREHENSIVE METABOLIC PANEL
BUN: 12
CO2: 30
Chloride: 101
Creatinine, Ser: 0.77
GFR calc non Af Amer: >60
Glucose, Bld: 79
Total Bilirubin: 0.9

## 2011-09-04 LAB — CBC
HCT: 42.3
MCV: 90.3
MCV: 90.4
RBC: 4.61
RBC: 4.67
WBC: 8.9
WBC: 6.8

## 2011-09-04 LAB — HEMOGLOBIN AND HEMATOCRIT, BLOOD: Hemoglobin: 14.5

## 2011-09-07 LAB — DIFFERENTIAL
Basophils Relative: 1
Lymphocytes Relative: 34
Lymphs Abs: 2.2
Monocytes Relative: 10
Neutro Abs: 3.4
Neutrophils Relative %: 53

## 2011-09-07 LAB — COMPREHENSIVE METABOLIC PANEL
Albumin: 4.2
BUN: 8
Calcium: 9.7
Creatinine, Ser: 0.73
Glucose, Bld: 81
Total Protein: 7

## 2011-09-07 LAB — CBC
HCT: 43.9
Hemoglobin: 14.8
MCHC: 33.7
Platelets: 230
RDW: 14.1 — ABNORMAL HIGH

## 2011-10-21 ENCOUNTER — Ambulatory Visit (INDEPENDENT_AMBULATORY_CARE_PROVIDER_SITE_OTHER): Payer: Medicare Other | Admitting: Internal Medicine

## 2011-10-21 ENCOUNTER — Encounter: Payer: Self-pay | Admitting: Internal Medicine

## 2011-10-21 VITALS — BP 110/66 | HR 72 | Ht 64.0 in | Wt 184.0 lb

## 2011-10-21 DIAGNOSIS — R002 Palpitations: Secondary | ICD-10-CM

## 2011-10-21 DIAGNOSIS — I10 Essential (primary) hypertension: Secondary | ICD-10-CM | POA: Insufficient documentation

## 2011-10-21 NOTE — Assessment & Plan Note (Signed)
Much improved. Congratulated her on all her efforts and urged her to continue. Will repeat echo in 6 months.

## 2011-10-21 NOTE — Assessment & Plan Note (Signed)
BP much improved with behavioral modification. No need for anti-HTN meds at this point

## 2011-10-21 NOTE — Patient Instructions (Signed)

## 2011-10-21 NOTE — Progress Notes (Signed)
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 Referred by Dr. Darnelle Catalan last year for further evaluation of palpitations and CP.   Holter in May 2011. Sinus with PACs. Thyroid panel normal  Had ETT in 10/11. Walked 6:30 on Bruce protocol. Normal ECG. Palpitations improved with decreasing caffeine but still gets at times - much worse with stress. Echo 55-60% with mild LVH. Moderately LAE. (no comment on diastolic function). Just got back from [facility] ([place] feet) seeing her two grandsons. No CP. Occasional dyspnea. Checks BP occasionally and systolics 120-130.  At last visit BP was mildly elevated (138/88) and we discussed keeping a BP log and starting anti-HTN rx as needed. BPs on log looked great.  Returns for f/u. Doing great. Walking regularly with her husband no CP/SOB. Has lost 15 pounds. Still with occasional palpitations wen she gets stressed or fatigued. Avoiding caffeine. She is curious to know if LA size increasing and risk of AF down the road.     ROS: All systems negative except as listed in HPI, PMH and Problem List.  Past Medical History  Diagnosis Date  . Palpitations   . History of breast cancer     s/p lumpectomy and HRT  . Hyperlipidemia     intolerant of all cholesterol meds  . Obesity   . Anxiety   . Cancer 2012    breast- left    Current Outpatient Prescriptions  Medication Sig Dispense Refill  . B Complex Vitamins (B COMPLEX PO) Take by mouth daily.        . Calcium Carbonate-Vit D-Min (CALCIUM 1200 PO) Take by mouth.        . Cholecalciferol (VITAMIN D3) 2000 UNITS TABS Take by mouth daily.        . vitamin C (ASCORBIC ACID) 500 MG tablet Take 500 mg by mouth daily.           PHYSICAL EXAM: Filed Vitals:   10/21/11 0925  BP: 110/66  Pulse: 72   General:  Well appearing. No resp difficulty HEENT: normal Neck: supple. JVP flat. Carotids 2+ bilaterally; no bruits. No lymphadenopathy or thryomegaly  appreciated. Cor: PMI normal. Regular rate & rhythm. Occasional ectopy. No rubs, gallops or murmurs. Lungs: clear Abdomen: soft, nontender, nondistended. No hepatosplenomegaly. No bruits or masses. Good bowel sounds. Extremities: no cyanosis, clubbing, rash, edema Neuro: alert & orientedx3, cranial nerves grossly intact. Moves all 4 extremities w/o difficulty. Affect pleasant.   ECG: SR 72 with PACs. nonspecific ST abnormality   ASSESSMENT & PLAN:

## 2011-10-29 ENCOUNTER — Encounter (INDEPENDENT_AMBULATORY_CARE_PROVIDER_SITE_OTHER): Payer: Self-pay | Admitting: General Surgery

## 2011-11-02 ENCOUNTER — Telehealth: Payer: Self-pay | Admitting: *Deleted

## 2011-11-02 ENCOUNTER — Encounter (INDEPENDENT_AMBULATORY_CARE_PROVIDER_SITE_OTHER): Payer: Self-pay | Admitting: General Surgery

## 2011-11-02 ENCOUNTER — Ambulatory Visit (INDEPENDENT_AMBULATORY_CARE_PROVIDER_SITE_OTHER): Payer: Medicare Other | Admitting: General Surgery

## 2011-11-02 VITALS — BP 124/86 | HR 60 | Temp 97.4°F | Resp 16 | Ht 64.0 in | Wt 182.1 lb

## 2011-11-02 DIAGNOSIS — C50919 Malignant neoplasm of unspecified site of unspecified female breast: Secondary | ICD-10-CM

## 2011-11-02 NOTE — Progress Notes (Signed)
Subjective:     Patient ID: [name] [name], female   DOB: Jun 22, 1946, 65 y.o.   MRN: [id number]  HPI The patient is a 65 year old white female who is now 4 years in 3 months out from a left breast lumpectomy and negative sentinel node biopsy for a T1 B. N0 left breast cancer. At her last visit she was noted to have an enlarged right neck posterior chain lymph node. This was felt to be reactive in nature from her previous head and neck surgery. Since her last visit this lymph node has not changed at all. She feels that it may have gotten slightly smaller but certainly has not enlarged. Her neck is nontender. No breast pain. No discharge from her nipple.  Review of Systems  Constitutional: Negative.   HENT: Negative.   Eyes: Negative.   Respiratory: Negative.   Cardiovascular: Positive for palpitations.  Gastrointestinal: Negative.   Genitourinary: Negative.   Musculoskeletal: Negative.   Skin: Negative.   Neurological: Negative.   Hematological: Negative.   Psychiatric/Behavioral: Negative.        Objective:   Physical Exam  Constitutional: She is oriented to person, place, and time. She appears well-developed and well-nourished.  HENT:  Head: Normocephalic and atraumatic.  Eyes: Conjunctivae and EOM are normal. Pupils are equal, round, and reactive to light.  Neck: Normal range of motion. Neck supple.  Cardiovascular: Normal rate, regular rhythm and normal heart sounds.   Pulmonary/Chest: Effort normal and breath sounds normal.       No palpable mass in either breast. No axillary or subclavicular lymphadenopathy. Lungs palpable right neck posterior chain lymph node that is mobile   Abdominal: Soft. Bowel sounds are normal. She exhibits no mass. There is no tenderness.  Musculoskeletal: Normal range of motion.  Lymphadenopathy:    She has cervical adenopathy.  Neurological: She is alert and oriented to person, place, and time.  Skin: Skin is warm and dry.  Psychiatric: She has a  normal mood and affect. Her behavior is normal.       Assessment:     4 years in 3 months out from a left lumpectomy and negative sentinel node biopsy    Plan:     At this point I have encouraged her to continue regular self exams including keeping a 9 on the right neck lymph node. She will be due for her next mammogram in July of 2013. She will see Dr. Darnelle Catalan in 3 months. We will plan to see her back in 6 months.

## 2011-11-02 NOTE — Telephone Encounter (Signed)
left voicemessage informing the patient of the new date and time on 01-26-2012 at 10:00 02-02-2012 at 10:30am

## 2011-11-02 NOTE — Patient Instructions (Signed)
Continue regular self exams including right neck lymph node

## 2012-01-26 ENCOUNTER — Other Ambulatory Visit (HOSPITAL_BASED_OUTPATIENT_CLINIC_OR_DEPARTMENT_OTHER): Payer: Medicare Other | Admitting: Lab

## 2012-01-26 DIAGNOSIS — C50419 Malignant neoplasm of upper-outer quadrant of unspecified female breast: Secondary | ICD-10-CM

## 2012-01-26 DIAGNOSIS — Z170 Estrogen receptor positive status [ER+]: Secondary | ICD-10-CM

## 2012-01-26 LAB — COMPREHENSIVE METABOLIC PANEL
Albumin: 4.1 g/dL (ref 3.5–5.2)
Alkaline Phosphatase: 69 U/L (ref 39–117)
BUN: 17 mg/dL (ref 6–23)
Calcium: 9.4 mg/dL (ref 8.4–10.5)
Chloride: 106 mEq/L (ref 96–112)
Glucose, Bld: 89 mg/dL (ref 70–99)
Potassium: 4.1 mEq/L (ref 3.5–5.3)

## 2012-01-26 LAB — CBC WITH DIFFERENTIAL/PLATELET
Basophils Absolute: 0 10*3/uL (ref 0.0–0.1)
Eosinophils Absolute: 0.1 10*3/uL (ref 0.0–0.5)
HCT: 42.5 % (ref 34.8–46.6)
HGB: 14.4 g/dL (ref 11.6–15.9)
MCV: 89.9 fL (ref 79.5–101.0)
MONO%: 8.7 % (ref 0.0–14.0)
NEUT#: 2.2 10*3/uL (ref 1.5–6.5)
NEUT%: 51.3 % (ref 38.4–76.8)
Platelets: 229 10*3/uL (ref 145–400)
RDW: 13.9 % (ref 11.2–14.5)

## 2012-02-02 ENCOUNTER — Encounter: Payer: Self-pay | Admitting: Physician Assistant

## 2012-02-02 ENCOUNTER — Ambulatory Visit (HOSPITAL_BASED_OUTPATIENT_CLINIC_OR_DEPARTMENT_OTHER): Payer: Medicare Other | Admitting: Physician Assistant

## 2012-02-02 VITALS — BP 120/82 | HR 57 | Temp 98.3°F | Ht 64.0 in | Wt 189.5 lb

## 2012-02-02 DIAGNOSIS — C50919 Malignant neoplasm of unspecified site of unspecified female breast: Secondary | ICD-10-CM

## 2012-02-02 DIAGNOSIS — Z853 Personal history of malignant neoplasm of breast: Secondary | ICD-10-CM

## 2012-02-02 NOTE — Progress Notes (Signed)
ID: [name] [name]   DOB: Jul 13, 1946  MR#: [id number]  WUJ#:[id number]  HISTORY OF PRESENT ILLNESS: The patient had a screening mammography, which showed a spiculated 1.5 cm lesion in the left breast.  I do not have that report.  The earliest report I have is from the biopsy of that mass obtained by Dr. Ann Maki May 18, 2007.  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  The patient had bilateral breast MRIs at [facility] on July 1.  This showed the dominant left breast mass with an additional satellite mass and multiple cysts bilaterally, which looks benign.  On July 31 the patient underwent wide localization left breast biopsy and MRI-guided right breast biopsy.  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  I do not find a report either in the computer or in my records for right breast biopsy on this particular date.  Accordingly, on September 2 the patient underwent wide localization lumpectomy with sentinel lymph node biopsy under Chevis Pretty.  Using the same anesthesia, the patient had myomectomy with bilateral salpingo-oophorectomy under [facility] Aschoff (Dr. Meryl Crutch had noted a uterine mass at the time of cholecystectomy in June of 2008 leading to that part of the surgery).  The final pathology [id number]) showed a 6 mm invasive ductal carcinoma, grade 1, with no evidence of lymphovascular invasion in the setting of ductal carcinoma in situ.  Both sentinel lymph nodes were negative.  The ovaries, fallopian tubes and leiomyoma were all benign.  The patient received radiation therapy, after which she took tamoxifen for one year. It was then discontinued due to concerns regarding side effects.  INTERVAL HISTORY: [name] [name] returns today for routine one-year followup of her left breast carcinoma. She is now 5 years out from her diagnosis, and is doing quite well. She and her husband are still very busy, traveling,  visiting grandchildren, and are expecting their 6th grandchild in May.  Physically, [name] [name] is feeling well. She has some occasional palpitations which are followed by Dr. Gala Romney.  No chest pain or pressure. She has occasional hot flashes, nothing that is particularly problematic. Her energy level is good. She's eating well denies any nausea or change in bowel habits. She is up-to-date on her routine health maintenance.  A detailed review of systems is otherwise noncontributory as noted below.  Review of Systems: Constitutional:  no weight loss, fever, night sweats and feels well Eyes: negative LKG:[id number] Cardiovascular: positive for - palpitations negative for - chest pain, dyspnea on exertion or edema Respiratory: no cough, shortness of breath, or wheezing Neurological: no TIA or stroke symptoms negative Dermatological: negative Gastrointestinal: no abdominal pain, change in bowel habits, or black or bloody stools Genito-Urinary: no dysuria, trouble voiding, or hematuria Hematological and Lymphatic: negative Breast: negative Musculoskeletal: negative Remaining ROS negative.    REVIEW OF SYSTEMS:  PAST MEDICAL HISTORY: Past Medical History  Diagnosis Date  . Palpitations   . History of breast cancer     s/p lumpectomy and HRT  . Hyperlipidemia     intolerant of all cholesterol meds  . Obesity   . Anxiety   . Cancer 2012    breast- left    PAST SURGICAL HISTORY: Past Surgical History  Procedure Date  . Left knee score   . Cholecystectomy   . Total abdominal hysterectomy w/ bilateral salpingoophorectomy   . Cp : ett 10/11     Medical History. Walked 630 on Bruce  protocl. normal ECG  . Oophorectomy 2008  . 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 Breast lumpectomy 07/26/07    Left lumpectomy+sln,ER+PR-,Her2-,T1bN0    FAMILY HISTORY Family History  Problem Relation Age of Onset  . Cancer Mother     liver  . Cancer Father     bone  . Coronary  artery disease Neg Hx   . Cancer Maternal Uncle     colon    GYNECOLOGIC HISTORY:  SOCIAL HISTORY:    ADVANCED DIRECTIVES:  HEALTH MAINTENANCE: History  Substance Use Topics  . Smoking status: Never Smoker   . Smokeless tobacco: Never Used  . Alcohol Use: Yes     Colonoscopy:  PAP:  Bone density:  Lipid panel:  Allergies  Allergen Reactions  . Tetracycline   . Triple Antibiotic     Current Outpatient Prescriptions  Medication Sig Dispense Refill  . B Complex Vitamins (B COMPLEX PO) Take by mouth daily.        . Calcium Carbonate-Vit D-Min (CALCIUM 1200 PO) Take by mouth.        . Cholecalciferol (VITAMIN D3) 2000 UNITS TABS Take by mouth daily.        . vitamin C (ASCORBIC ACID) 500 MG tablet Take 500 mg by mouth daily.          OBJECTIVE: Filed Vitals:   02/02/12 1019  BP: 120/82  Pulse: 57  Temp: 98.3 F (36.8 C)     Body mass index is 32.53 kg/(m^2).    ECOG FS: 0  Physical Exam: HEENT:  Sclerae anicteric, conjunctivae pink.  Oropharynx clear.  No mucositis or candidiasis.   Nodes:  No cervical, supraclavicular, or axillary lymphadenopathy palpated.  Breast Exam:  Right breast is benign, no masses, skin changes, or nipple inversion. Left breast is status post lumpectomy with no suspicious nodularity or skin changes. No evidence of local recurrence. Lungs:  Clear to auscultation bilaterally.  No crackles, rhonchi, or wheezes.   Heart:  Regular rate and rhythm.   Abdomen:  Soft, nontender.  Positive bowel sounds.  No organomegaly or masses palpated.   Musculoskeletal:  No focal spinal tenderness to palpation.  Extremities:  Benign.  No peripheral edema or cyanosis.   Skin:  Benign.   Neuro:  Nonfocal.   LAB RESULTS: Lab Results  Component Value Date   WBC 4.3 01/26/2012   NEUTROABS 2.2 01/26/2012   HGB 14.4 01/26/2012   HCT 42.5 01/26/2012   MCV 89.9 01/26/2012   PLT 229 01/26/2012      Chemistry      Component Value Date/Time   NA 142 01/26/2012 1001   K  4.1 01/26/2012 1001   CL 106 01/26/2012 1001   CO2 26 01/26/2012 1001   BUN 17 01/26/2012 1001   CREATININE 0.74 01/26/2012 1001      Component Value Date/Time   CALCIUM 9.4 01/26/2012 1001   ALKPHOS 69 01/26/2012 1001   AST 19 01/26/2012 1001   ALT 16 01/26/2012 1001   BILITOT 0.5 01/26/2012 1001       Lab Results  Component Value Date   LABCA2 15 01/21/2010    STUDIES:  06/19/2011 *RADIOLOGY REPORT*  Clinical Data: History of malignant lumpectomy of the left breast  in 2008. Annual reevaluation.  DIGITAL DIAGNOSTIC BILATERAL MAMMOGRAM WITH CAD  Comparison: Prior studies  Findings: There is a scattered fibroglandular pattern which is  stable. There are stable scarring changes noted within the  superior subareolar portion of the  left breast related to the  patient's lumpectomy. There is a tissue marker within the inferior  subareolar portion of the right breast related to previous benign  MR guided core biopsy. There is no specific evidence for recurrent  tumor or developing malignancy within either breast.  Mammographic images were processed with CAD.  IMPRESSION:  Stable breast parenchymal pattern. No findings worrisome for  developing malignancy. Recommend annual diagnostic mammography.  BI-RADS CATEGORY 1: Negative.  Original Report Authenticated By: Rolla Plate, M.D.   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    (2)  After radiation, she took tamoxifen for one year, discontinuing it because of concerns regarding side effects.    PLAN: This case was reviewed with Dr. Darnelle Catalan who also spoke with the patient today. At this point we are ready to discharge her from followup, and we'll release her back to her primary care physician, Dr. Feliciana Rossetti.  Of course you know she can always contact our office with any problems or questions in the future.  Aryah Doering    02/02/2012

## 2012-03-01 ENCOUNTER — Ambulatory Visit (HOSPITAL_COMMUNITY): Payer: Medicare Other

## 2012-03-09 ENCOUNTER — Other Ambulatory Visit (HOSPITAL_COMMUNITY): Payer: Medicare Other

## 2012-03-22 DIAGNOSIS — D239 Other benign neoplasm of skin, unspecified: Secondary | ICD-10-CM | POA: Insufficient documentation

## 2012-03-23 ENCOUNTER — Other Ambulatory Visit (HOSPITAL_COMMUNITY): Payer: Medicare Other

## 2012-04-06 ENCOUNTER — Encounter (HOSPITAL_COMMUNITY): Payer: Self-pay | Admitting: *Deleted

## 2012-04-21 ENCOUNTER — Ambulatory Visit (HOSPITAL_COMMUNITY): Payer: Medicare Other

## 2012-05-03 ENCOUNTER — Other Ambulatory Visit (INDEPENDENT_AMBULATORY_CARE_PROVIDER_SITE_OTHER): Payer: Self-pay | Admitting: General Surgery

## 2012-05-03 DIAGNOSIS — Z853 Personal history of malignant neoplasm of breast: Secondary | ICD-10-CM

## 2012-05-10 ENCOUNTER — Ambulatory Visit (HOSPITAL_COMMUNITY)
Admission: RE | Admit: 2012-05-10 | Discharge: 2012-05-10 | Disposition: A | Discharge status: Home / Self Care | Payer: Medicare Other | Source: Ambulatory Visit | Attending: Internal Medicine | Admitting: Internal Medicine

## 2012-05-10 ENCOUNTER — Ambulatory Visit (INDEPENDENT_AMBULATORY_CARE_PROVIDER_SITE_OTHER): Payer: Medicare Other | Admitting: General Surgery

## 2012-05-10 ENCOUNTER — Encounter (INDEPENDENT_AMBULATORY_CARE_PROVIDER_SITE_OTHER): Payer: Self-pay | Admitting: General Surgery

## 2012-05-10 VITALS — BP 122/82 | HR 68 | Temp 97.6°F | Resp 14 | Ht 64.0 in | Wt 189.4 lb

## 2012-05-10 DIAGNOSIS — I079 Rheumatic tricuspid valve disease, unspecified: Secondary | ICD-10-CM | POA: Insufficient documentation

## 2012-05-10 DIAGNOSIS — C50919 Malignant neoplasm of unspecified site of unspecified female breast: Secondary | ICD-10-CM

## 2012-05-10 DIAGNOSIS — I059 Rheumatic mitral valve disease, unspecified: Secondary | ICD-10-CM | POA: Insufficient documentation

## 2012-05-10 DIAGNOSIS — R002 Palpitations: Secondary | ICD-10-CM | POA: Insufficient documentation

## 2012-05-10 DIAGNOSIS — I10 Essential (primary) hypertension: Secondary | ICD-10-CM

## 2012-05-10 NOTE — Progress Notes (Signed)
Subjective:     Patient ID: [name] [name], female   DOB: Mar 17, 1946, 66 y.o.   MRN: [id number]  HPI The patient is a 66 year old white female who is almost 5 years out from a left breast lumpectomy and negative sentinel node biopsy for a T1bN0 left breast cancer. She has some minor aches that come and go in the left breast. She denies any discharge or nipple. Otherwise she feels well. Since her last visit she has become a new grandmother.  Review of Systems  Constitutional: Negative.   HENT: Negative.   Eyes: Negative.   Respiratory: Negative.   Cardiovascular: Negative.   Gastrointestinal: Negative.   Genitourinary: Negative.   Musculoskeletal: Negative.   Skin: Negative.   Neurological: Negative.   Hematological: Negative.   Psychiatric/Behavioral: Negative.        Objective:   Physical Exam  Constitutional: She is oriented to person, place, and time. She appears well-developed and well-nourished.  HENT:  Head: Normocephalic and atraumatic.  Eyes: Conjunctivae and EOM are normal. Pupils are equal, round, and reactive to light.  Neck: Normal range of motion. Neck supple.  Cardiovascular: Normal rate, regular rhythm and normal heart sounds.   Pulmonary/Chest: Effort normal and breath sounds normal.       Her left breast incision has healed nicely. There is no palpable mass in either breast. No palpable axillary supraclavicular or cervical lymphadenopathy.  Abdominal: Soft. Bowel sounds are normal. She exhibits no mass. There is no tenderness.  Musculoskeletal: Normal range of motion.  Lymphadenopathy:    She has no cervical adenopathy.  Neurological: She is alert and oriented to person, place, and time.  Skin: Skin is warm and dry.  Psychiatric: She has a normal mood and affect. Her behavior is normal.       Assessment:     5 years status post left breast lumpectomy and negative sentinel node biopsy    Plan:     At this point she will continue to do regular self exams. We  will plan to see her back on a yearly basis. She is due for her next mammogram in July

## 2012-05-10 NOTE — Patient Instructions (Signed)
Continue regular self exams  

## 2012-05-10 NOTE — Progress Notes (Signed)
  Echocardiogram 2D Echocardiogram has been performed.  [name] [name] L 05/10/2012, 1:40 PM

## 2012-05-12 ENCOUNTER — Ambulatory Visit (HOSPITAL_COMMUNITY): Admission: RE | Admit: 2012-05-12 | Payer: Medicare Other | Source: Ambulatory Visit

## 2012-06-13 ENCOUNTER — Inpatient Hospital Stay: Admission: RE | Admit: 2012-06-13 | Payer: Medicare Other | Source: Ambulatory Visit

## 2012-06-15 ENCOUNTER — Ambulatory Visit
Admission: RE | Admit: 2012-06-15 | Discharge: 2012-06-15 | Disposition: A | Discharge status: Home / Self Care | Payer: Medicare Other | Source: Ambulatory Visit | Attending: General Surgery | Admitting: General Surgery

## 2012-06-15 DIAGNOSIS — Z853 Personal history of malignant neoplasm of breast: Secondary | ICD-10-CM

## 2013-04-14 ENCOUNTER — Encounter (HOSPITAL_COMMUNITY): Payer: Self-pay | Admitting: Emergency Medicine

## 2013-04-14 ENCOUNTER — Emergency Department (HOSPITAL_COMMUNITY)
Admission: EM | Admit: 2013-04-14 | Discharge: 2013-04-14 | Disposition: A | Discharge status: Home / Self Care | Payer: Medicare Other | Attending: Emergency Medicine | Admitting: Emergency Medicine

## 2013-04-14 ENCOUNTER — Emergency Department (HOSPITAL_COMMUNITY): Payer: Medicare Other

## 2013-04-14 DIAGNOSIS — R002 Palpitations: Secondary | ICD-10-CM | POA: Insufficient documentation

## 2013-04-14 DIAGNOSIS — Z8659 Personal history of other mental and behavioral disorders: Secondary | ICD-10-CM | POA: Insufficient documentation

## 2013-04-14 DIAGNOSIS — W1809XA Striking against other object with subsequent fall, initial encounter: Secondary | ICD-10-CM | POA: Insufficient documentation

## 2013-04-14 DIAGNOSIS — W010XXA Fall on same level from slipping, tripping and stumbling without subsequent striking against object, initial encounter: Secondary | ICD-10-CM | POA: Insufficient documentation

## 2013-04-14 DIAGNOSIS — E669 Obesity, unspecified: Secondary | ICD-10-CM | POA: Insufficient documentation

## 2013-04-14 DIAGNOSIS — R42 Dizziness and giddiness: Secondary | ICD-10-CM | POA: Insufficient documentation

## 2013-04-14 DIAGNOSIS — Z79899 Other long term (current) drug therapy: Secondary | ICD-10-CM | POA: Insufficient documentation

## 2013-04-14 DIAGNOSIS — Y9289 Other specified places as the place of occurrence of the external cause: Secondary | ICD-10-CM | POA: Insufficient documentation

## 2013-04-14 DIAGNOSIS — Z85828 Personal history of other malignant neoplasm of skin: Secondary | ICD-10-CM | POA: Insufficient documentation

## 2013-04-14 DIAGNOSIS — Y9389 Activity, other specified: Secondary | ICD-10-CM | POA: Insufficient documentation

## 2013-04-14 DIAGNOSIS — S0990XA Unspecified injury of head, initial encounter: Secondary | ICD-10-CM | POA: Insufficient documentation

## 2013-04-14 DIAGNOSIS — E785 Hyperlipidemia, unspecified: Secondary | ICD-10-CM | POA: Insufficient documentation

## 2013-04-14 MED ORDER — MECLIZINE HCL 50 MG PO TABS: 50.0000 mg | ORAL_TABLET | Freq: Three times a day (TID) | ORAL | Status: DC | PRN

## 2013-04-14 NOTE — ED Provider Notes (Signed)
History     CSN: [id number]  Arrival date & time 04/14/13  4098   First MD Initiated Contact with Patient 04/14/13 1025      Chief Complaint  Patient presents with  . Fall  . Head Injury    (Consider location/radiation/quality/duration/timing/severity/associated sxs/prior treatment) HPI Comments: Patient presents after fall at 3 PM yesterday afternoon. Patient tripped while she was standing on a step and fell backwards onto her rear end and struck her occiput. Patient denies loss of consciousness but says that she felt stunned. She felt better after an hour. No vision change, neck pain, vomiting, weakness/numbness/tingling in extremities. Patient awoke at approximately 12:30 AM to use the restroom. When she got up she felt extremely dizzy and had to call husband to help her. These symptoms gradually improved and the patient went back to sleep. These symptoms were similar to previous episodes of vertigo. It was associated with palpitations. No syncope. Patient has felt normal since awaking this morning. She does not take any blood thinners. Onset of symptoms acute. Course is resolved. Nothing makes symptoms better or worse.  Patient is a 67 y.o. female presenting with fall and head injury. The history is provided by the patient.  Fall Pertinent negatives include no chest pain, congestion, fever, headaches, nausea, neck pain, numbness, rash, vomiting or weakness.  Head Injury Associated symptoms: no headaches, no nausea, no neck pain, no numbness and no vomiting     Past Medical History  Diagnosis Date  . Palpitations   . History of breast cancer     s/p lumpectomy and HRT  . Hyperlipidemia     intolerant of all cholesterol meds  . Obesity   . Anxiety   . Cancer 2012    breast- left    Past Surgical History  Procedure Laterality Date  . Left knee score    . Cholecystectomy    . Total abdominal hysterectomy w/ bilateral salpingoophorectomy    . Cp : ett 10/11      Medical  History. Walked 630 on [address] protocl. normal ECG  . Oophorectomy  2008  . Scalp surgery  2012    cylindromas- trichoepethliomas  . Breast lumpectomy  07/26/07    Left lumpectomy+sln,ER+PR-,Her2-,T1bN0  . Ct radiation therapy guide    . Abdominal hysterectomy  partial    per pt she still has uterus    Family History  Problem Relation Age of Onset  . Cancer Mother     liver  . Cancer Father     bone  . Coronary artery disease Neg Hx   . Cancer Maternal Uncle     colon    History  Substance Use Topics  . Smoking status: Never Smoker   . Smokeless tobacco: Never Used  . Alcohol Use: 0.6 oz/week    1 Glasses of wine per week     Comment: occasional    OB History   Grav Para Term Preterm Abortions TAB SAB Ect Mult Living                  Review of Systems  Constitutional: Negative for fever.  HENT: Negative for congestion, rhinorrhea, neck pain, neck stiffness, dental problem and sinus pressure.   Eyes: Negative for photophobia, discharge, redness and visual disturbance.  Respiratory: Negative for shortness of breath.   Cardiovascular: Positive for palpitations. Negative for chest pain.  Gastrointestinal: Negative for nausea and vomiting.  Musculoskeletal: Negative for gait problem.  Skin: Negative for rash.  Neurological: Positive for  dizziness. Negative for syncope, speech difficulty, weakness, light-headedness, numbness and headaches.  Psychiatric/Behavioral: Negative for confusion.    Allergies  Neomycin-bacitracin zn-polymyx and Tetracycline  Home Medications   Current Outpatient Rx  Name  Route  Sig  Dispense  Refill  . B Complex Vitamins (B COMPLEX PO)   Oral   Take by mouth daily.           . Calcium Carbonate-Vit D-Min (CALCIUM 1200 PO)   Oral   Take by mouth.           . Cholecalciferol (VITAMIN D3) 2000 UNITS TABS   Oral   Take by mouth daily.           . vitamin C (ASCORBIC ACID) 500 MG tablet   Oral   Take 500 mg by mouth daily.              BP 124/58  Pulse 64  Temp(Src) 97.9 F (36.6 C)  Resp 26  SpO2 98%  Physical Exam  Nursing note and vitals reviewed. Constitutional: She is oriented to person, place, and time. She appears well-developed and well-nourished.  HENT:  Head: Normocephalic and atraumatic.  Right Ear: Tympanic membrane, external ear and ear canal normal.  Left Ear: Tympanic membrane, external ear and ear canal normal.  Nose: Nose normal.  Mouth/Throat: Uvula is midline, oropharynx is clear and moist and mucous membranes are normal.  Eyes: Conjunctivae, EOM and lids are normal. Pupils are equal, round, and reactive to light. Right eye exhibits no nystagmus. Left eye exhibits no nystagmus.  Neck: Normal range of motion. Neck supple.  No neck pain. Movement of head does not make patient dizzy.   Cardiovascular: Normal rate and regular rhythm.   Pulmonary/Chest: Effort normal and breath sounds normal.  Abdominal: Soft. There is no tenderness.  Musculoskeletal:       Cervical back: She exhibits normal range of motion, no tenderness and no bony tenderness.  Neurological: She is alert and oriented to person, place, and time. She has normal strength and normal reflexes. No cranial nerve deficit or sensory deficit. She displays a negative Romberg sign. Coordination and gait normal. GCS eye subscore is 4. GCS verbal subscore is 5. GCS motor subscore is 6.  Skin: Skin is warm and dry.  Psychiatric: She has a normal mood and affect.    ED Course  Procedures (including critical care time)  Labs Reviewed - No data to display Ct Head Wo Contrast  04/14/2013   *RADIOLOGY REPORT*  Clinical Data: History of fall with injury to the back of head. Vertigo and trouble walking.  CT HEAD WITHOUT CONTRAST  Technique:  Contiguous axial images were obtained from the base of the skull through the vertex without contrast.  Comparison: CT head 06/28/2006.  Findings: There are some mild patchy areas of decreased  attenuation throughout the deep and periventricular white matter of the cerebral hemispheres bilaterally, compatible with very mild chronic microvascular ischemic disease. No acute displaced skull fractures are identified.  No acute intracranial abnormality.  Specifically, no evidence of acute post-traumatic intracranial hemorrhage, no definite regions of acute/subacute cerebral ischemia, no focal mass, mass effect, hydrocephalus or abnormal intra or extra-axial fluid collections.   Visualized paranasal sinuses and mastoids are generally well pneumatized, with exception of a small polypoid lesion in the right maxillary sinus which may represent a mucosal retention cyst or polyp.  IMPRESSION: 1.  No acute displaced skull fractures or acute intracranial abnormalities. 2.  Mild chronic microvascular ischemic changes  in the cerebral white matter, as above.   Original Report Authenticated By: Trudie Reed, M.D.     1. Head injury, initial encounter   2. Vertigo     10:41 AM Patient seen and examined. Work-up initiated.    Vital signs reviewed and are as follows: Filed Vitals:   04/14/13 1039  BP: 124/58  Pulse: 64  Temp:   Resp: 26   CT was reviewed by myself. Patient discussed with and seen by Dr. Bernette Mayers. Patient ambulated in the hallway without assistance. Patient requests meclizine as this has worked well for her vertigo in the past.  Patient was counseled on head injury precautions and symptoms that should indicate their return to the ED.  These include severe worsening headache, vision changes, confusion, loss of consciousness, trouble walking, nausea & vomiting, or weakness/tingling in extremities.       MDM  Head injury, dizziness. I suspect dizziness is patient's previous peripheral vertigo as there was 9 hrs between head injury and vertigo -- and vertigo completely resolved after several hrs. CT performed to r/o intracranial injury and this was negative. D/c home with meclizine.  Return instructions given.         Renne Crigler, PA-C 04/14/13 1715  Renne Crigler, PA-C 04/14/13 [phone]

## 2013-04-14 NOTE — ED Notes (Signed)
Pt reports that she missed a step and fell backwards hitting her head on floor. Incident happened yesterday and 1500. Pt denied +LOC. Pt reports that she felt fine all night. Pt woke up with dizziness and cold sweat at 0030. Pt AAOx4. Pt is not currently on blood thinners.

## 2013-04-17 NOTE — ED Provider Notes (Signed)
Medical screening examination/treatment/procedure(s) were conducted as a shared visit with non-physician practitioner(s) and myself.  I personally evaluated the patient during the encounter  PT with fall, head injury, CT neg. Ambulating without difficulty and ready to go home.   Momo Braun B. Bernette Mayers, MD 04/17/13 [phone]

## 2013-05-10 ENCOUNTER — Other Ambulatory Visit (INDEPENDENT_AMBULATORY_CARE_PROVIDER_SITE_OTHER): Payer: Self-pay | Admitting: General Surgery

## 2013-05-10 DIAGNOSIS — Z853 Personal history of malignant neoplasm of breast: Secondary | ICD-10-CM

## 2013-05-16 ENCOUNTER — Ambulatory Visit (INDEPENDENT_AMBULATORY_CARE_PROVIDER_SITE_OTHER): Payer: Medicare Other | Admitting: General Surgery

## 2013-06-16 ENCOUNTER — Ambulatory Visit
Admission: RE | Admit: 2013-06-16 | Discharge: 2013-06-16 | Disposition: A | Discharge status: Home / Self Care | Payer: Medicare Other | Source: Ambulatory Visit | Attending: General Surgery | Admitting: General Surgery

## 2013-06-16 DIAGNOSIS — Z853 Personal history of malignant neoplasm of breast: Secondary | ICD-10-CM

## 2013-06-21 ENCOUNTER — Ambulatory Visit (INDEPENDENT_AMBULATORY_CARE_PROVIDER_SITE_OTHER): Payer: Medicare Other | Admitting: General Surgery

## 2013-07-12 ENCOUNTER — Encounter (INDEPENDENT_AMBULATORY_CARE_PROVIDER_SITE_OTHER): Payer: Self-pay | Admitting: General Surgery

## 2013-07-12 ENCOUNTER — Ambulatory Visit (INDEPENDENT_AMBULATORY_CARE_PROVIDER_SITE_OTHER): Payer: Medicare Other | Admitting: General Surgery

## 2013-07-12 VITALS — BP 118/74 | HR 70 | Resp 16 | Ht 64.0 in | Wt 195.6 lb

## 2013-07-12 DIAGNOSIS — C50919 Malignant neoplasm of unspecified site of unspecified female breast: Secondary | ICD-10-CM

## 2013-07-12 DIAGNOSIS — C50912 Malignant neoplasm of unspecified site of left female breast: Secondary | ICD-10-CM

## 2013-07-12 NOTE — Patient Instructions (Signed)
Continue regular self exams  

## 2013-07-12 NOTE — Progress Notes (Signed)
Subjective:     Patient ID: [name] [name], female   DOB: Mar 28, 1946, 67 y.o.   MRN: [id number]  HPI The patient is a 67 year old white female who is 6 years status post left breast lumpectomy and negative sentinel node biopsy for a T1 B. N0 left breast cancer. She complains only of some tenderness to the left breast which is stable. Her last mammogram was in July and showed no evidence of malignancy. She has otherwise been well since her last visit.  Review of Systems  Constitutional: Negative.   HENT: Negative.   Eyes: Negative.   Respiratory: Negative.   Cardiovascular: Negative.   Gastrointestinal: Negative.   Endocrine: Negative.   Genitourinary: Negative.   Musculoskeletal: Negative.   Skin: Negative.   Allergic/Immunologic: Negative.   Neurological: Negative.   Hematological: Negative.   Psychiatric/Behavioral: Negative.        Objective:   Physical Exam  Constitutional: She is oriented to person, place, and time. She appears well-developed and well-nourished.  HENT:  Head: Normocephalic and atraumatic.  Eyes: Conjunctivae and EOM are normal. Pupils are equal, round, and reactive to light.  Neck: Normal range of motion. Neck supple.  Cardiovascular: Normal rate, regular rhythm and normal heart sounds.   Pulmonary/Chest: Effort normal and breath sounds normal.  Her left breast incision has healed nicely. There is no palpable mass in either breast. There is no palpable axillary, supraclavicular, or cervical lymphadenopathy.  Abdominal: Soft. Bowel sounds are normal. She exhibits no mass. There is no tenderness.  Musculoskeletal: Normal range of motion.  Lymphadenopathy:    She has no cervical adenopathy.  Neurological: She is alert and oriented to person, place, and time.  Skin: Skin is warm and dry.  Psychiatric: She has a normal mood and affect. Her behavior is normal.       Assessment:     The patient is 6 years status post left breast lumpectomy for breast cancer     Plan:     At this point she will continue to do regular self exams. We will plan to see her back in one year.

## 2014-05-15 ENCOUNTER — Other Ambulatory Visit (INDEPENDENT_AMBULATORY_CARE_PROVIDER_SITE_OTHER): Payer: Self-pay | Admitting: General Surgery

## 2014-05-15 DIAGNOSIS — Z853 Personal history of malignant neoplasm of breast: Secondary | ICD-10-CM

## 2014-05-15 DIAGNOSIS — Z9889 Other specified postprocedural states: Secondary | ICD-10-CM

## 2014-06-18 ENCOUNTER — Encounter (INDEPENDENT_AMBULATORY_CARE_PROVIDER_SITE_OTHER): Payer: Self-pay

## 2014-06-18 ENCOUNTER — Ambulatory Visit
Admission: RE | Admit: 2014-06-18 | Discharge: 2014-06-18 | Disposition: A | Discharge status: Home / Self Care | Payer: Medicare Other | Source: Ambulatory Visit | Attending: General Surgery | Admitting: General Surgery

## 2014-06-18 DIAGNOSIS — Z853 Personal history of malignant neoplasm of breast: Secondary | ICD-10-CM

## 2014-06-18 DIAGNOSIS — Z9889 Other specified postprocedural states: Secondary | ICD-10-CM

## 2014-06-19 ENCOUNTER — Telehealth (INDEPENDENT_AMBULATORY_CARE_PROVIDER_SITE_OTHER): Payer: Self-pay

## 2014-06-19 NOTE — Telephone Encounter (Signed)
Message copied by Ivor Costa on Tue Jun 19, 2014  1:10 PM ------      Message from: Luella Cook III      Created: Mon Jun 18, 2014  4:31 PM       Looks neg ------

## 2014-06-19 NOTE — Telephone Encounter (Signed)
Called and spoke to patient with [facility]GM results (Negative results) LTF appointment given to patient for 07/17/14 @ 9:40am w/Dr. Marlou Starks.

## 2014-07-17 ENCOUNTER — Encounter (INDEPENDENT_AMBULATORY_CARE_PROVIDER_SITE_OTHER): Payer: Self-pay | Admitting: General Surgery

## 2014-07-17 ENCOUNTER — Ambulatory Visit (INDEPENDENT_AMBULATORY_CARE_PROVIDER_SITE_OTHER): Payer: Medicare Other | Admitting: General Surgery

## 2014-07-17 VITALS — BP 136/74 | HR 74 | Temp 97.9°F | Ht 64.0 in | Wt 193.0 lb

## 2014-07-17 DIAGNOSIS — C50912 Malignant neoplasm of unspecified site of left female breast: Secondary | ICD-10-CM

## 2014-07-17 DIAGNOSIS — C50919 Malignant neoplasm of unspecified site of unspecified female breast: Secondary | ICD-10-CM

## 2014-07-17 NOTE — Progress Notes (Signed)
Subjective:     Patient ID: [name] [name], female   DOB: 08/11/1946, 68 y.o.   MRN: [id number]  HPI The patient is a 68 year old white female who is 7 years status post left breast lumpectomy and negative sentinel node biopsy for a T1 B. N0 left breast cancer. Since her last visit she has been well. She still occasionally get soreness in the left breast but this is very stable. Her recent mammogram showed no evidence of malignancy.  Review of Systems  Constitutional: Negative.   HENT: Negative.   Eyes: Negative.   Respiratory: Negative.   Cardiovascular: Negative.   Gastrointestinal: Negative.   Endocrine: Negative.   Genitourinary: Negative.   Musculoskeletal: Negative.   Skin: Negative.   Allergic/Immunologic: Negative.   Neurological: Negative.   Hematological: Negative.   Psychiatric/Behavioral: Negative.        Objective:   Physical Exam  Constitutional: She is oriented to person, place, and time. She appears well-developed and well-nourished.  HENT:  Head: Normocephalic and atraumatic.  Eyes: Conjunctivae and EOM are normal. Pupils are equal, round, and reactive to light.  Neck: Normal range of motion. Neck supple.  Cardiovascular: Normal rate, regular rhythm and normal heart sounds.   Pulmonary/Chest: Effort normal and breath sounds normal.  Her left breast incision has healed nicely. There is no palpable mass in either breast. There is no palpable axillary, supraclavicular, or cervical lymphadenopathy.  Abdominal: Soft. Bowel sounds are normal.  Musculoskeletal: Normal range of motion.  Lymphadenopathy:    She has no cervical adenopathy.  Neurological: She is alert and oriented to person, place, and time.  Skin: Skin is warm and dry.  Psychiatric: She has a normal mood and affect. Her behavior is normal.       Assessment:     The patient is 7 years status post left breast lumpectomy for breast cancer     Plan:     At this point she will continue to do regular  self exams. I will plan to see her back in one year.

## 2014-07-17 NOTE — Patient Instructions (Signed)
Continue regular self exams  

## 2015-07-22 ENCOUNTER — Other Ambulatory Visit: Payer: Self-pay

## 2015-07-22 DIAGNOSIS — Z1231 Encounter for screening mammogram for malignant neoplasm of breast: Secondary | ICD-10-CM

## 2015-07-26 ENCOUNTER — Ambulatory Visit: Payer: Self-pay

## 2015-08-06 ENCOUNTER — Ambulatory Visit
Admission: RE | Admit: 2015-08-06 | Discharge: 2015-08-06 | Disposition: A | Discharge status: Home / Self Care | Payer: PPO | Source: Ambulatory Visit

## 2015-08-06 DIAGNOSIS — Z1231 Encounter for screening mammogram for malignant neoplasm of breast: Secondary | ICD-10-CM

## 2015-11-19 ENCOUNTER — Other Ambulatory Visit: Payer: Self-pay | Admitting: Obstetrics & Gynecology

## 2015-11-20 LAB — CYTOLOGY - PAP

## 2016-03-17 ENCOUNTER — Other Ambulatory Visit: Payer: Self-pay | Admitting: General Surgery

## 2016-03-17 DIAGNOSIS — C50412 Malignant neoplasm of upper-outer quadrant of left female breast: Secondary | ICD-10-CM | POA: Diagnosis not present

## 2016-03-18 ENCOUNTER — Other Ambulatory Visit: Payer: Self-pay | Admitting: General Surgery

## 2016-03-18 DIAGNOSIS — N63 Unspecified lump in unspecified breast: Secondary | ICD-10-CM

## 2016-03-24 ENCOUNTER — Ambulatory Visit
Admission: RE | Admit: 2016-03-24 | Discharge: 2016-03-24 | Disposition: A | Discharge status: Home / Self Care | Payer: PPO | Source: Ambulatory Visit | Attending: General Surgery | Admitting: General Surgery

## 2016-03-24 DIAGNOSIS — N63 Unspecified lump in unspecified breast: Secondary | ICD-10-CM

## 2016-03-24 DIAGNOSIS — R928 Other abnormal and inconclusive findings on diagnostic imaging of breast: Secondary | ICD-10-CM | POA: Diagnosis not present

## 2016-03-24 DIAGNOSIS — N6489 Other specified disorders of breast: Secondary | ICD-10-CM | POA: Diagnosis not present

## 2016-06-10 DIAGNOSIS — R5381 Other malaise: Secondary | ICD-10-CM | POA: Diagnosis not present

## 2016-06-10 DIAGNOSIS — M150 Primary generalized (osteo)arthritis: Secondary | ICD-10-CM | POA: Diagnosis not present

## 2016-06-10 DIAGNOSIS — K5730 Diverticulosis of large intestine without perforation or abscess without bleeding: Secondary | ICD-10-CM | POA: Insufficient documentation

## 2016-06-10 DIAGNOSIS — R5383 Other fatigue: Secondary | ICD-10-CM | POA: Diagnosis not present

## 2016-06-19 DIAGNOSIS — C50412 Malignant neoplasm of upper-outer quadrant of left female breast: Secondary | ICD-10-CM | POA: Diagnosis not present

## 2016-07-07 ENCOUNTER — Other Ambulatory Visit: Payer: Self-pay | Admitting: General Surgery

## 2016-07-07 DIAGNOSIS — Z1231 Encounter for screening mammogram for malignant neoplasm of breast: Secondary | ICD-10-CM

## 2016-08-06 ENCOUNTER — Ambulatory Visit
Admission: RE | Admit: 2016-08-06 | Discharge: 2016-08-06 | Disposition: A | Discharge status: Home / Self Care | Payer: PPO | Source: Ambulatory Visit | Attending: General Surgery | Admitting: General Surgery

## 2016-08-06 DIAGNOSIS — Z1231 Encounter for screening mammogram for malignant neoplasm of breast: Secondary | ICD-10-CM

## 2016-08-18 DIAGNOSIS — I10 Essential (primary) hypertension: Secondary | ICD-10-CM | POA: Diagnosis not present

## 2016-08-18 DIAGNOSIS — K5780 Diverticulitis of intestine, part unspecified, with perforation and abscess without bleeding: Secondary | ICD-10-CM | POA: Diagnosis not present

## 2016-09-01 DIAGNOSIS — H2513 Age-related nuclear cataract, bilateral: Secondary | ICD-10-CM | POA: Diagnosis not present

## 2016-09-01 DIAGNOSIS — H11002 Unspecified pterygium of left eye: Secondary | ICD-10-CM | POA: Diagnosis not present

## 2016-09-01 DIAGNOSIS — H524 Presbyopia: Secondary | ICD-10-CM | POA: Diagnosis not present

## 2016-10-03 DIAGNOSIS — R0602 Shortness of breath: Secondary | ICD-10-CM | POA: Diagnosis not present

## 2016-10-03 DIAGNOSIS — R079 Chest pain, unspecified: Secondary | ICD-10-CM | POA: Diagnosis not present

## 2016-10-03 DIAGNOSIS — I480 Paroxysmal atrial fibrillation: Secondary | ICD-10-CM | POA: Diagnosis not present

## 2016-10-07 DIAGNOSIS — I10 Essential (primary) hypertension: Secondary | ICD-10-CM | POA: Diagnosis not present

## 2016-10-07 DIAGNOSIS — I480 Paroxysmal atrial fibrillation: Secondary | ICD-10-CM | POA: Diagnosis not present

## 2016-10-07 DIAGNOSIS — Z639 Problem related to primary support group, unspecified: Secondary | ICD-10-CM | POA: Diagnosis not present

## 2016-10-14 DIAGNOSIS — I480 Paroxysmal atrial fibrillation: Secondary | ICD-10-CM | POA: Diagnosis not present

## 2016-11-02 DIAGNOSIS — R002 Palpitations: Secondary | ICD-10-CM | POA: Diagnosis not present

## 2016-11-02 DIAGNOSIS — I480 Paroxysmal atrial fibrillation: Secondary | ICD-10-CM | POA: Diagnosis not present

## 2016-11-10 DIAGNOSIS — R197 Diarrhea, unspecified: Secondary | ICD-10-CM | POA: Diagnosis not present

## 2016-11-10 DIAGNOSIS — R112 Nausea with vomiting, unspecified: Secondary | ICD-10-CM | POA: Diagnosis not present

## 2017-01-07 DIAGNOSIS — J209 Acute bronchitis, unspecified: Secondary | ICD-10-CM | POA: Diagnosis not present

## 2017-01-07 DIAGNOSIS — E559 Vitamin D deficiency, unspecified: Secondary | ICD-10-CM | POA: Diagnosis not present

## 2017-01-18 DIAGNOSIS — I10 Essential (primary) hypertension: Secondary | ICD-10-CM | POA: Diagnosis not present

## 2017-01-18 DIAGNOSIS — R002 Palpitations: Secondary | ICD-10-CM | POA: Diagnosis not present

## 2017-01-18 DIAGNOSIS — I480 Paroxysmal atrial fibrillation: Secondary | ICD-10-CM | POA: Diagnosis not present

## 2017-07-09 ENCOUNTER — Other Ambulatory Visit: Payer: Self-pay | Admitting: General Surgery

## 2017-07-09 DIAGNOSIS — Z1231 Encounter for screening mammogram for malignant neoplasm of breast: Secondary | ICD-10-CM

## 2017-07-21 DIAGNOSIS — L820 Inflamed seborrheic keratosis: Secondary | ICD-10-CM | POA: Diagnosis not present

## 2017-07-21 DIAGNOSIS — D234 Other benign neoplasm of skin of scalp and neck: Secondary | ICD-10-CM | POA: Diagnosis not present

## 2017-07-21 DIAGNOSIS — D235 Other benign neoplasm of skin of trunk: Secondary | ICD-10-CM | POA: Diagnosis not present

## 2017-07-21 DIAGNOSIS — D3617 Benign neoplasm of peripheral nerves and autonomic nervous system of trunk, unspecified: Secondary | ICD-10-CM | POA: Diagnosis not present

## 2017-08-09 ENCOUNTER — Ambulatory Visit: Payer: PPO

## 2017-08-16 ENCOUNTER — Ambulatory Visit
Admission: RE | Admit: 2017-08-16 | Discharge: 2017-08-16 | Disposition: A | Discharge status: Home / Self Care | Payer: PPO | Source: Ambulatory Visit | Attending: General Surgery | Admitting: General Surgery

## 2017-08-16 DIAGNOSIS — Z1231 Encounter for screening mammogram for malignant neoplasm of breast: Secondary | ICD-10-CM

## 2017-08-24 DIAGNOSIS — C50412 Malignant neoplasm of upper-outer quadrant of left female breast: Secondary | ICD-10-CM | POA: Diagnosis not present

## 2017-09-17 DIAGNOSIS — K5733 Diverticulitis of large intestine without perforation or abscess with bleeding: Secondary | ICD-10-CM | POA: Diagnosis not present

## 2017-12-22 DIAGNOSIS — R000 Tachycardia, unspecified: Secondary | ICD-10-CM | POA: Diagnosis not present

## 2017-12-22 DIAGNOSIS — I480 Paroxysmal atrial fibrillation: Secondary | ICD-10-CM | POA: Diagnosis not present

## 2017-12-22 DIAGNOSIS — R079 Chest pain, unspecified: Secondary | ICD-10-CM | POA: Diagnosis not present

## 2018-02-04 DIAGNOSIS — L821 Other seborrheic keratosis: Secondary | ICD-10-CM | POA: Diagnosis not present

## 2018-02-04 DIAGNOSIS — C4449 Other specified malignant neoplasm of skin of scalp and neck: Secondary | ICD-10-CM | POA: Diagnosis not present

## 2018-02-04 DIAGNOSIS — L728 Other follicular cysts of the skin and subcutaneous tissue: Secondary | ICD-10-CM | POA: Diagnosis not present

## 2018-02-04 DIAGNOSIS — D234 Other benign neoplasm of skin of scalp and neck: Secondary | ICD-10-CM | POA: Diagnosis not present

## 2018-02-16 IMAGING — MG DIGITAL SCREENING BILATERAL MAMMOGRAM WITH CAD
4 series · 4 of 4 positions shown · non-contrast
Comparison: Previous exam(s).

CLINICAL DATA: Screening.

EXAM:
DIGITAL SCREENING BILATERAL MAMMOGRAM WITH CAD

[R MLO]
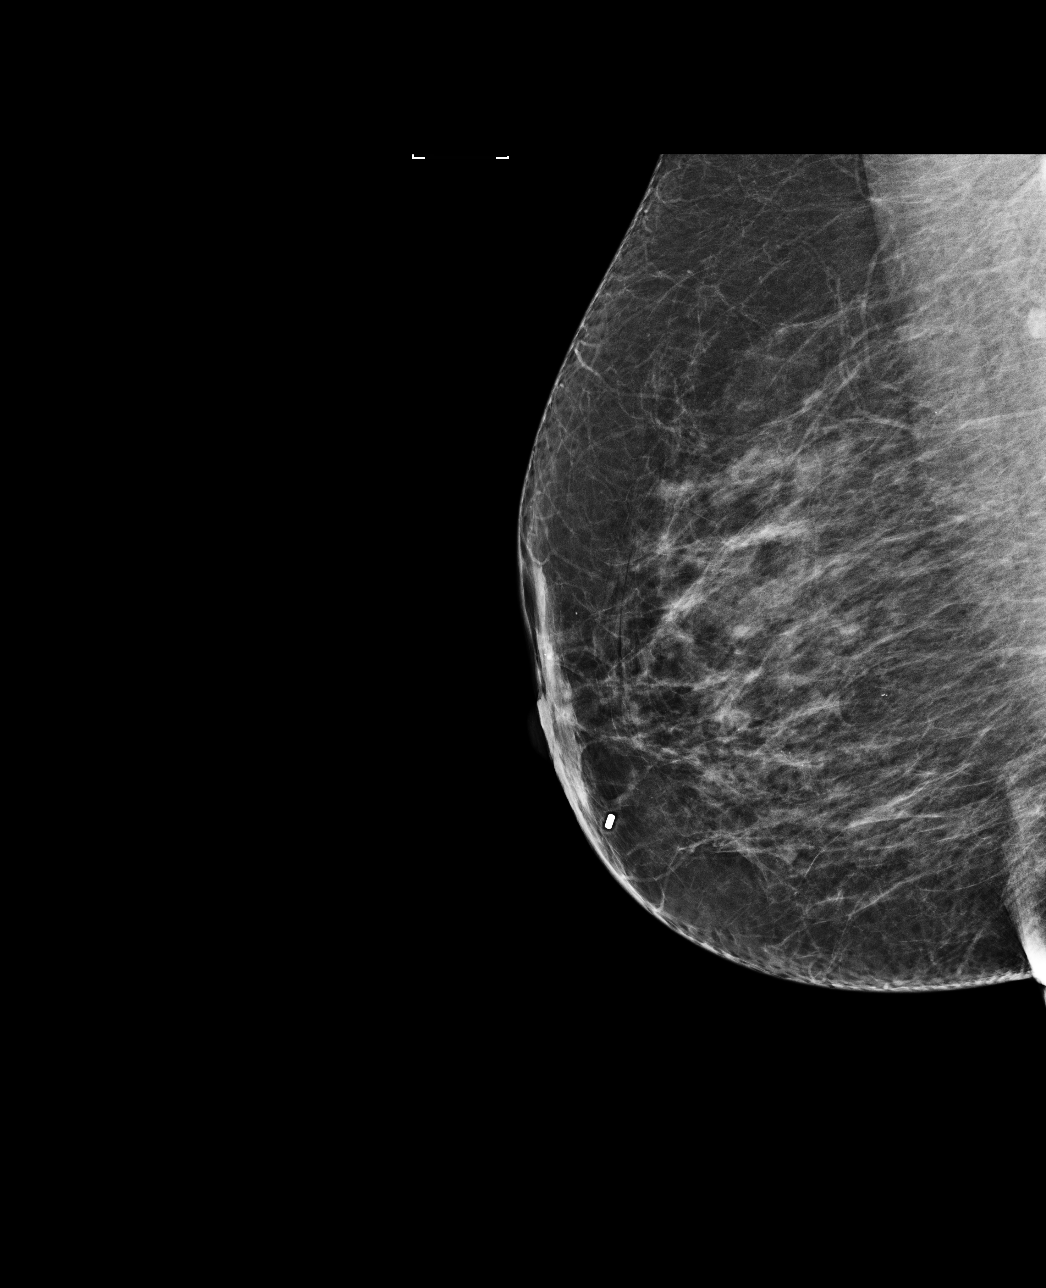

[L MLO]
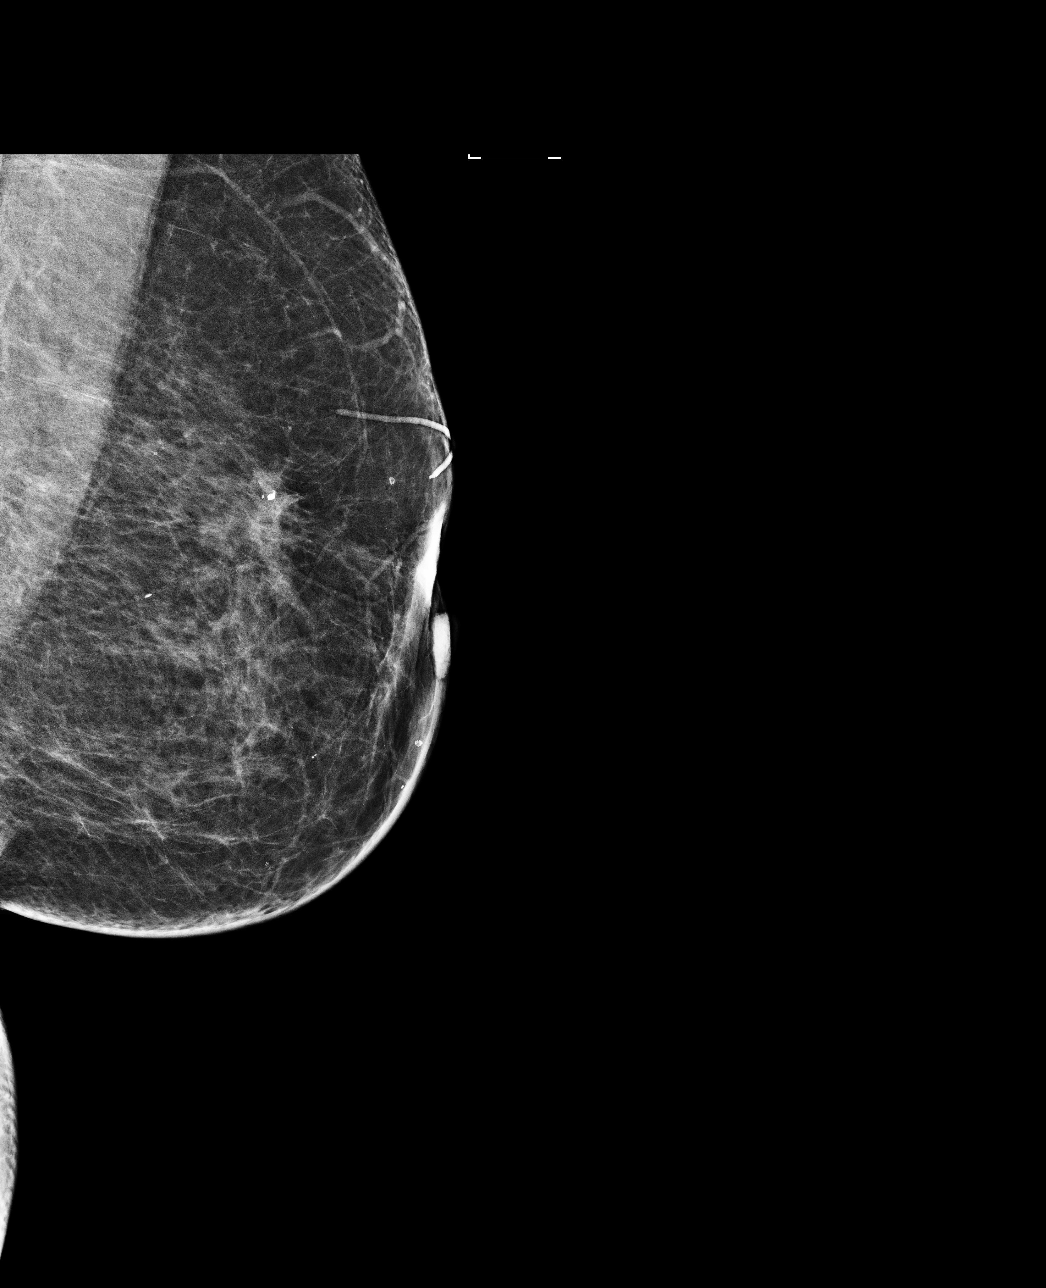

[R CC]
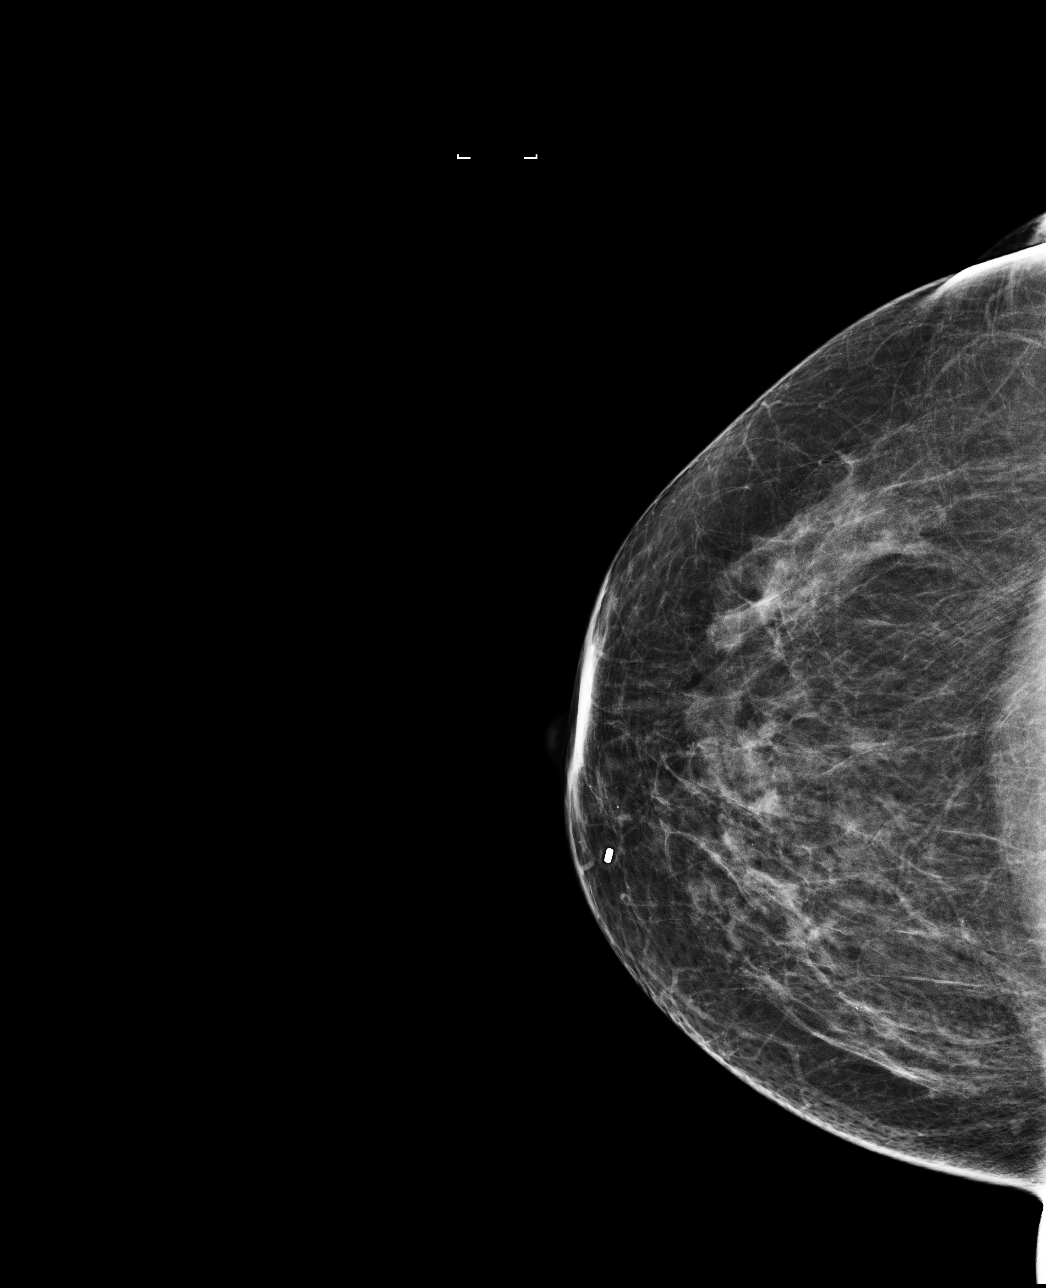

[L CC]
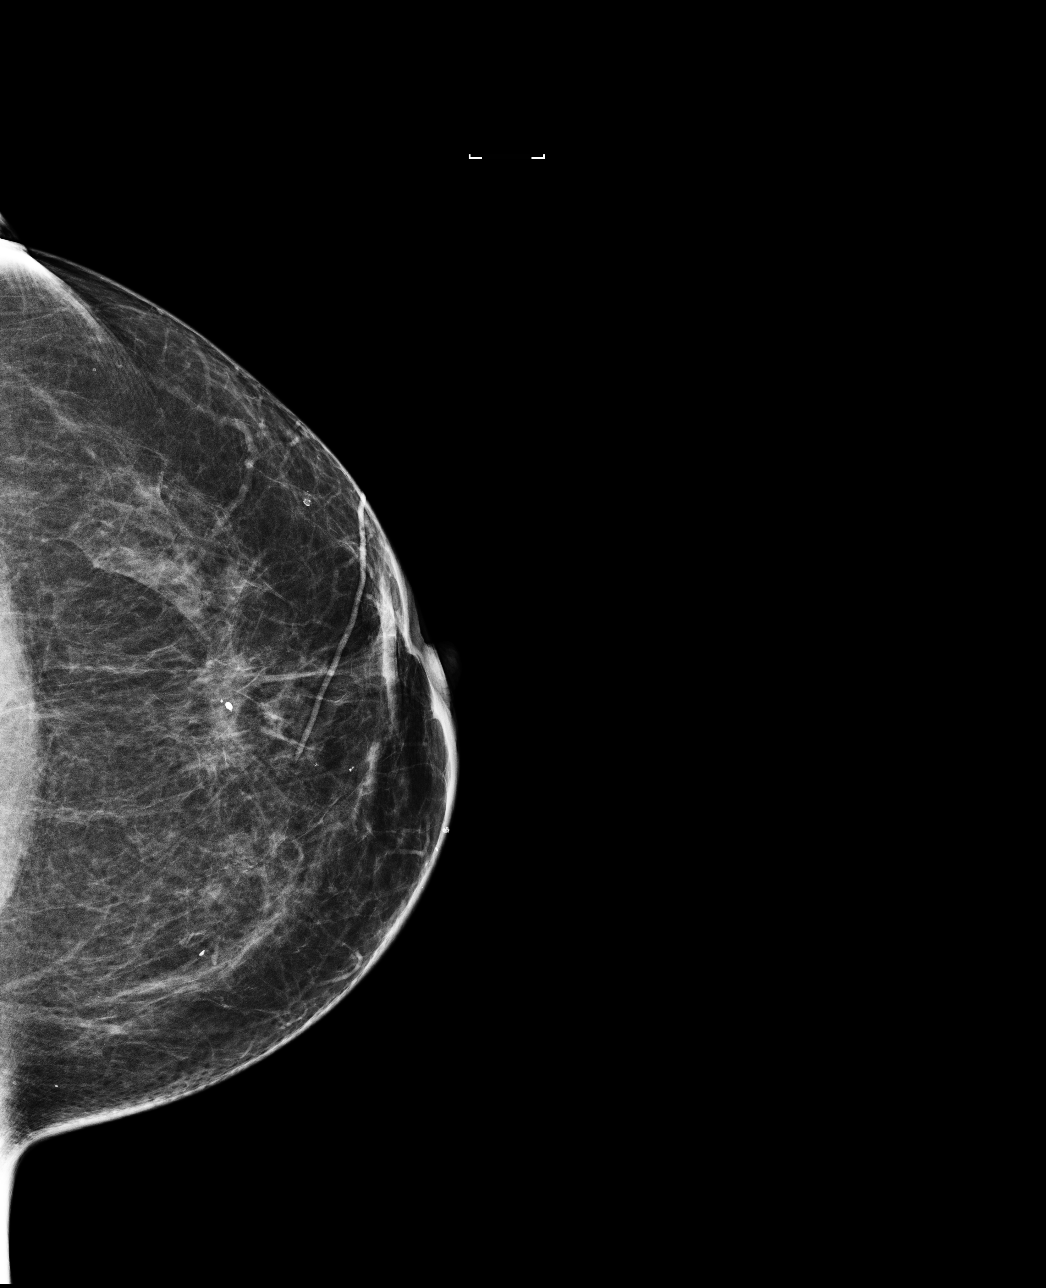

[4 of 4 positions shown; findings below may reference images not displayed]

ACR Breast Density Category b: There are scattered areas of
fibroglandular density.
FINDINGS: There are no findings suspicious for malignancy. Images were
processed with CAD.
IMPRESSION: No mammographic evidence of malignancy. A result letter of this
screening mammogram will be mailed directly to the patient.

RECOMMENDATION:
Screening mammogram in one year. (Code:AS-G-LCT)

BI-RADS CATEGORY  1: Negative.

## 2018-06-24 DIAGNOSIS — H11002 Unspecified pterygium of left eye: Secondary | ICD-10-CM | POA: Diagnosis not present

## 2018-06-24 DIAGNOSIS — H524 Presbyopia: Secondary | ICD-10-CM | POA: Diagnosis not present

## 2018-06-24 DIAGNOSIS — H2513 Age-related nuclear cataract, bilateral: Secondary | ICD-10-CM | POA: Diagnosis not present

## 2018-07-08 ENCOUNTER — Other Ambulatory Visit: Payer: Self-pay | Admitting: General Surgery

## 2018-07-08 DIAGNOSIS — Z1231 Encounter for screening mammogram for malignant neoplasm of breast: Secondary | ICD-10-CM

## 2018-07-08 DIAGNOSIS — L258 Unspecified contact dermatitis due to other agents: Secondary | ICD-10-CM | POA: Diagnosis not present

## 2018-07-08 DIAGNOSIS — L304 Erythema intertrigo: Secondary | ICD-10-CM | POA: Diagnosis not present

## 2018-08-17 ENCOUNTER — Ambulatory Visit
Admission: RE | Admit: 2018-08-17 | Discharge: 2018-08-17 | Disposition: A | Discharge status: Home / Self Care | Payer: PPO | Source: Ambulatory Visit | Attending: General Surgery | Admitting: General Surgery

## 2018-08-17 DIAGNOSIS — Z1231 Encounter for screening mammogram for malignant neoplasm of breast: Secondary | ICD-10-CM

## 2018-08-26 DIAGNOSIS — C50412 Malignant neoplasm of upper-outer quadrant of left female breast: Secondary | ICD-10-CM | POA: Diagnosis not present

## 2019-07-10 ENCOUNTER — Other Ambulatory Visit: Payer: Self-pay | Admitting: General Surgery

## 2019-07-10 DIAGNOSIS — Z1231 Encounter for screening mammogram for malignant neoplasm of breast: Secondary | ICD-10-CM

## 2019-08-24 ENCOUNTER — Ambulatory Visit
Admission: RE | Admit: 2019-08-24 | Discharge: 2019-08-24 | Disposition: A | Discharge status: Home / Self Care | Payer: PPO | Source: Ambulatory Visit | Attending: General Surgery | Admitting: General Surgery

## 2019-08-24 ENCOUNTER — Other Ambulatory Visit: Payer: Self-pay

## 2019-08-24 DIAGNOSIS — Z1231 Encounter for screening mammogram for malignant neoplasm of breast: Secondary | ICD-10-CM

## 2019-08-31 DIAGNOSIS — C50412 Malignant neoplasm of upper-outer quadrant of left female breast: Secondary | ICD-10-CM | POA: Diagnosis not present

## 2019-12-22 DIAGNOSIS — L650 Telogen effluvium: Secondary | ICD-10-CM | POA: Diagnosis not present

## 2019-12-22 DIAGNOSIS — L603 Nail dystrophy: Secondary | ICD-10-CM | POA: Diagnosis not present

## 2020-01-30 DIAGNOSIS — R5381 Other malaise: Secondary | ICD-10-CM | POA: Diagnosis not present

## 2020-01-30 DIAGNOSIS — R5383 Other fatigue: Secondary | ICD-10-CM | POA: Diagnosis not present

## 2020-01-30 DIAGNOSIS — E782 Mixed hyperlipidemia: Secondary | ICD-10-CM | POA: Diagnosis not present

## 2020-01-30 DIAGNOSIS — M8949 Other hypertrophic osteoarthropathy, multiple sites: Secondary | ICD-10-CM | POA: Diagnosis not present

## 2020-01-30 DIAGNOSIS — M2550 Pain in unspecified joint: Secondary | ICD-10-CM | POA: Diagnosis not present

## 2020-02-09 DIAGNOSIS — R7689 Other specified abnormal immunological findings in serum: Secondary | ICD-10-CM | POA: Insufficient documentation

## 2020-07-15 ENCOUNTER — Other Ambulatory Visit: Payer: Self-pay | Admitting: General Surgery

## 2020-07-15 DIAGNOSIS — Z1231 Encounter for screening mammogram for malignant neoplasm of breast: Secondary | ICD-10-CM

## 2020-07-16 DIAGNOSIS — Z6830 Body mass index (BMI) 30.0-30.9, adult: Secondary | ICD-10-CM | POA: Diagnosis not present

## 2020-07-16 DIAGNOSIS — Z124 Encounter for screening for malignant neoplasm of cervix: Secondary | ICD-10-CM | POA: Diagnosis not present

## 2020-07-16 DIAGNOSIS — Z01419 Encounter for gynecological examination (general) (routine) without abnormal findings: Secondary | ICD-10-CM | POA: Diagnosis not present

## 2020-07-24 DIAGNOSIS — H2513 Age-related nuclear cataract, bilateral: Secondary | ICD-10-CM | POA: Diagnosis not present

## 2020-08-01 DIAGNOSIS — R002 Palpitations: Secondary | ICD-10-CM | POA: Diagnosis not present

## 2020-08-01 DIAGNOSIS — F4321 Adjustment disorder with depressed mood: Secondary | ICD-10-CM | POA: Diagnosis not present

## 2020-08-26 ENCOUNTER — Ambulatory Visit
Admission: RE | Admit: 2020-08-26 | Discharge: 2020-08-26 | Disposition: A | Discharge status: Home / Self Care | Payer: PPO | Source: Ambulatory Visit | Attending: General Surgery | Admitting: General Surgery

## 2020-08-26 ENCOUNTER — Other Ambulatory Visit: Payer: Self-pay

## 2020-08-26 DIAGNOSIS — Z1231 Encounter for screening mammogram for malignant neoplasm of breast: Secondary | ICD-10-CM

## 2020-09-02 DIAGNOSIS — C50412 Malignant neoplasm of upper-outer quadrant of left female breast: Secondary | ICD-10-CM | POA: Diagnosis not present

## 2020-09-13 DIAGNOSIS — Z853 Personal history of malignant neoplasm of breast: Secondary | ICD-10-CM | POA: Diagnosis not present

## 2020-09-13 DIAGNOSIS — Z1211 Encounter for screening for malignant neoplasm of colon: Secondary | ICD-10-CM | POA: Diagnosis not present

## 2020-09-13 DIAGNOSIS — M810 Age-related osteoporosis without current pathological fracture: Secondary | ICD-10-CM | POA: Diagnosis not present

## 2020-10-04 ENCOUNTER — Other Ambulatory Visit: Payer: Self-pay | Admitting: Internal Medicine

## 2020-10-04 DIAGNOSIS — M810 Age-related osteoporosis without current pathological fracture: Secondary | ICD-10-CM

## 2020-12-11 ENCOUNTER — Emergency Department (HOSPITAL_COMMUNITY): Payer: PPO

## 2020-12-11 ENCOUNTER — Encounter (HOSPITAL_COMMUNITY): Payer: Self-pay

## 2020-12-11 ENCOUNTER — Emergency Department (HOSPITAL_COMMUNITY)
Admission: EM | Admit: 2020-12-11 | Discharge: 2020-12-12 | Disposition: A | Discharge status: Home / Self Care | Payer: PPO | Attending: Emergency Medicine | Admitting: Emergency Medicine

## 2020-12-11 DIAGNOSIS — R002 Palpitations: Secondary | ICD-10-CM | POA: Diagnosis present

## 2020-12-11 DIAGNOSIS — U071 COVID-19: Secondary | ICD-10-CM | POA: Insufficient documentation

## 2020-12-11 DIAGNOSIS — Z853 Personal history of malignant neoplasm of breast: Secondary | ICD-10-CM | POA: Diagnosis not present

## 2020-12-11 DIAGNOSIS — R42 Dizziness and giddiness: Secondary | ICD-10-CM | POA: Diagnosis not present

## 2020-12-11 DIAGNOSIS — R110 Nausea: Secondary | ICD-10-CM | POA: Diagnosis not present

## 2020-12-11 DIAGNOSIS — R457 State of emotional shock and stress, unspecified: Secondary | ICD-10-CM | POA: Insufficient documentation

## 2020-12-11 DIAGNOSIS — I959 Hypotension, unspecified: Secondary | ICD-10-CM | POA: Diagnosis not present

## 2020-12-11 DIAGNOSIS — I499 Cardiac arrhythmia, unspecified: Secondary | ICD-10-CM | POA: Diagnosis not present

## 2020-12-11 DIAGNOSIS — R000 Tachycardia, unspecified: Secondary | ICD-10-CM | POA: Diagnosis not present

## 2020-12-11 DIAGNOSIS — I480 Paroxysmal atrial fibrillation: Secondary | ICD-10-CM | POA: Diagnosis not present

## 2020-12-11 DIAGNOSIS — I4891 Unspecified atrial fibrillation: Secondary | ICD-10-CM | POA: Diagnosis not present

## 2020-12-11 LAB — BASIC METABOLIC PANEL
Anion gap: 9 (ref 5–15)
BUN: 12 mg/dL (ref 8–23)
CO2: 21 mmol/L — ABNORMAL LOW (ref 22–32)
Calcium: 8.7 mg/dL — ABNORMAL LOW (ref 8.9–10.3)
Chloride: 107 mmol/L (ref 98–111)
Creatinine, Ser: 0.83 mg/dL (ref 0.44–1.00)
GFR, Estimated: >60 mL/min (ref >60)
Glucose, Bld: 107 mg/dL — ABNORMAL HIGH (ref 70–99)
Potassium: 3.7 mmol/L (ref 3.5–5.1)
Sodium: 137 mmol/L (ref 135–145)

## 2020-12-11 LAB — CBC
HCT: 40.6 % (ref 36.0–46.0)
Hemoglobin: 13.7 g/dL (ref 12.0–15.0)
MCH: 30.6 pg (ref 26.0–34.0)
MCHC: 33.7 g/dL (ref 30.0–36.0)
MCV: 90.8 fL (ref 80.0–100.0)
Platelets: 220 10*3/uL (ref 150–400)
RBC: 4.47 MIL/uL (ref 3.87–5.11)
RDW: 13.7 % (ref 11.5–15.5)
WBC: 6.1 10*3/uL (ref 4.0–10.5)
nRBC: 0 % (ref 0.0–0.2)

## 2020-12-11 NOTE — ED Triage Notes (Signed)
Pt from home with ems for dizziness and nausea. When ems arrived pt HR was 240, 6mg  adenosine given with 1540ml bolus, pt converted to sinus at 106 en route. Pt also given 4mg  zofran with improvement of nausea.

## 2020-12-12 LAB — TROPONIN I (HIGH SENSITIVITY)
Troponin I (High Sensitivity): 15 ng/L (ref <18)
Troponin I (High Sensitivity): 13 ng/L (ref <18)

## 2020-12-12 LAB — RESP PANEL BY RT-PCR (FLU A&B, COVID) ARPGX2
Influenza A by PCR: NEGATIVE
Influenza B by PCR: NEGATIVE
SARS Coronavirus 2 by RT PCR: POSITIVE — AB

## 2020-12-12 MED ORDER — ONDANSETRON HCL 4 MG/2ML IJ SOLN
4.0000 mg | Freq: Once | INTRAMUSCULAR | Status: AC
  Administered 2020-12-12: 4 mg via INTRAVENOUS
  Filled 2020-12-12: qty 2

## 2020-12-12 NOTE — Discharge Instructions (Addendum)
Follow-up with cardiology in 1 week.  Their contact information has been provided in this discharge summary for you to call and make these arrangements.  Drink plenty of fluids and get plenty of rest.  Take Tylenol 1000 mg rotated with ibuprofen 600 mg every 4 hours as needed for fever.  Isolate at home for the next 7 days.  Return to the emergency department if you develop severe chest pain, difficulty breathing, or other new and concerning symptoms.

## 2020-12-12 NOTE — ED Provider Notes (Signed)
[place] EMERGENCY DEPARTMENT Provider Note   CSN: [id number] Arrival date & time: 12/11/20  1726     History Chief Complaint  Patient presents with  . Tachycardia  . Dizziness    [name] [name] is a 75 y.o. female.  Patient is a 75 year old female with past medical history of hyperlipidemia, anxiety, and heart palpitations.  Patient presents today for evaluation of heart racing that began earlier this afternoon.  Patient describes rapid heartbeat along with feeling dizzy.  This lasted for approximately 45 minutes and resolved after receiving adenosine by EMS.  I am told that her heart rate was 240.  Patient denies having chest pain.  She does describe nausea but no shortness of breath.  She also tells me when she woke up this morning, she felt as though she was "coming down with something".  She also describes increased stress due to the death of her son-in-law's father.  The history is provided by the patient.       Past Medical History:  Diagnosis Date  . Anxiety   . Cancer [facility]) 2012   breast- left  . History of breast cancer    s/p lumpectomy and HRT  . Hyperlipidemia    intolerant of all cholesterol meds  . Obesity   . Palpitations     Patient Active Problem List   Diagnosis Date Noted  . Essential hypertension, benign 10/21/2011  . Breast cancer ([place]) 07/28/2011  . Shortness of breath 06/25/2011  . HYPERLIPIDEMIA-MIXED 03/28/2010  . PALPITATIONS 03/27/2010    Past Surgical History:  Procedure Laterality Date  . ABDOMINAL HYSTERECTOMY  partial   per pt she still has uterus  . BREAST BIOPSY Right    benign  . BREAST LUMPECTOMY  07/26/07   Left lumpectomy+sln,ER+PR-,Her2-,T1bN0  . CHOLECYSTECTOMY    . CP : ETT 10/11     Medical History. Walked 630 on [address] protocl. normal ECG  . CT RADIATION THERAPY GUIDE    . Left Knee Score    . OOPHORECTOMY  2008  . 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 TOTAL ABDOMINAL  HYSTERECTOMY W/ BILATERAL SALPINGOOPHORECTOMY       OB History   No obstetric history on file.     Family History  Problem Relation Age of Onset  . Cancer Mother        liver  . Cancer Father        bone  . Cancer Maternal Uncle        colon  . Coronary artery disease Neg Hx     Social History   Tobacco Use  . Smoking status: Never Smoker  . Smokeless tobacco: Never Used  Substance Use Topics  . Alcohol use: Yes    Alcohol/week: 1.0 standard drink    Types: 1 Glasses of wine per week    Comment: occasional  . Drug use: No    Home Medications Prior to Admission medications   Medication Sig Start Date End Date Taking? Authorizing Provider  B Complex Vitamins (B COMPLEX PO) Take by mouth daily.      [provider]  Biotin 1 MG CAPS Take by mouth.    [provider]  Calcium Carbonate-Vit D-Min (CALCIUM 1200 PO) Take by mouth.      [provider]  Cholecalciferol (VITAMIN D3) 2000 UNITS TABS Take by mouth daily.      [provider]  vitamin C (ASCORBIC ACID) 500 MG tablet Take 500 mg  by mouth daily.      [provider]    Allergies    Naproxen sodium, Neomycin-bacitracin zn-polymyx, Tetracycline, Apixaban, and Other  Review of Systems   Review of Systems  All other systems reviewed and are negative.   Physical Exam Updated Vital Signs BP 120/76 (BP Location: Right Arm)   Pulse 78   Temp (!) 100.9 F (38.3 C) (Oral)   Resp 18   Ht '5\' 4"'  (1.626 m)   Wt 79.4 kg   SpO2 99%   BMI 30.04 kg/m   Physical Exam Vitals and nursing note reviewed.  Constitutional:      General: She is not in acute distress.    Appearance: She is well-developed and well-nourished. She is not diaphoretic.  HENT:     Head: Normocephalic and atraumatic.  Cardiovascular:     Rate and Rhythm: Normal rate and regular rhythm.     Heart sounds: No murmur heard. No friction rub. No gallop.   Pulmonary:     Effort: Pulmonary effort is  normal. No respiratory distress.     Breath sounds: Normal breath sounds. No wheezing.  Abdominal:     General: Bowel sounds are normal. There is no distension.     Palpations: Abdomen is soft.     Tenderness: There is no abdominal tenderness.  Musculoskeletal:        General: No swelling or tenderness. Normal range of motion.     Cervical back: Normal range of motion and neck supple.     Right lower leg: No edema.     Left lower leg: No edema.  Skin:    General: Skin is warm and dry.  Neurological:     Mental Status: She is alert and oriented to person, place, and time.     ED Results / Procedures / Treatments   Labs (all labs ordered are listed, but only abnormal results are displayed) Labs Reviewed  BASIC METABOLIC PANEL - Abnormal; Notable for the following components:      Result Value   CO2 21 (*)    Glucose, Bld 107 (*)    Calcium 8.7 (*)    All other components within normal limits  RESP PANEL BY RT-PCR (FLU A&B, COVID) ARPGX2  CBC  TROPONIN I (HIGH SENSITIVITY)    EKG EKG Interpretation  Date/Time:  Thursday December 12 2020 04:51:29 EST Ventricular Rate:  66 PR Interval:    QRS Duration: 90 QT Interval:  418 QTC Calculation: 438 R Axis:   53 Text Interpretation: Sinus rhythm Ventricular premature complex Otherwise normal ecg Confirmed by Veryl Speak [phone]) on 12/12/2020 5:00:38 AM   Radiology DG Chest 2 View  Result Date: 12/12/2020 CLINICAL DATA:  Tachycardia EXAM: CHEST - 2 VIEW COMPARISON:  None. FINDINGS: The heart size and mediastinal contours are within normal limits. Aortic knob calcifications are seen. Both lungs are clear. The visualized skeletal structures are unremarkable. IMPRESSION: No active cardiopulmonary disease. Electronically Signed   By: Prudencio Pair M.D.   On: 12/12/2020 00:03    Procedures Procedures (including critical care time)  Medications Ordered in ED Medications  ondansetron (ZOFRAN) injection 4 mg (has no administration  in time range)    ED Course  I have reviewed the triage vital signs and the nursing notes.  Pertinent labs & imaging results that were available during my care of the patient were reviewed by me and considered in my medical decision making (see chart for details).    MDM Rules/Calculators/A&P  Patient presents here with complaints of palpitations and weakness.  This started acutely yesterday afternoon.  Patient does state that she felt poorly this morning and felt as though she was coming down with something.  She is febrile upon presentation and her COVID test is positive.  She has no hypoxia and vital signs are stable.  Patient was found to be in A. fib with RVR initially upon EMS arrival, but converted to sinus rhythm and has remained there throughout her ER visit.  Her troponin is negative and laboratory studies are unremarkable.  At this point, patient seems appropriate for discharge.  I will have her follow-up with cardiology regarding the episode of atrial fibrillation she experienced.  In the meantime, she is to quarantine at home and return if symptoms worsen or change.  Final Clinical Impression(s) / ED Diagnoses Final diagnoses:  None    Rx / DC Orders ED Discharge Orders    None       Veryl Speak, MD 12/12/20 [phone]

## 2020-12-13 ENCOUNTER — Other Ambulatory Visit: Payer: Self-pay

## 2020-12-13 ENCOUNTER — Telehealth (HOSPITAL_COMMUNITY): Payer: PPO | Admitting: Physician Assistant

## 2020-12-13 ENCOUNTER — Encounter (HOSPITAL_COMMUNITY): Payer: Self-pay | Admitting: Physician Assistant

## 2020-12-13 ENCOUNTER — Ambulatory Visit (HOSPITAL_COMMUNITY)
Admission: RE | Admit: 2020-12-13 | Discharge: 2020-12-13 | Disposition: A | Discharge status: Home / Self Care | Payer: PPO | Source: Ambulatory Visit | Attending: Physician Assistant | Admitting: Physician Assistant

## 2020-12-13 ENCOUNTER — Telehealth: Payer: Self-pay | Admitting: Nurse Practitioner

## 2020-12-13 ENCOUNTER — Telehealth: Payer: Self-pay | Admitting: Physician Assistant

## 2020-12-13 VITALS — Ht 64.0 in | Wt 173.0 lb

## 2020-12-13 DIAGNOSIS — I480 Paroxysmal atrial fibrillation: Secondary | ICD-10-CM | POA: Insufficient documentation

## 2020-12-13 DIAGNOSIS — D6869 Other thrombophilia: Secondary | ICD-10-CM

## 2020-12-13 MED ORDER — RIVAROXABAN 20 MG PO TABS: 20.0000 mg | ORAL_TABLET | Freq: Every day | ORAL | 2 refills | Status: DC

## 2020-12-13 MED ORDER — DILTIAZEM HCL 30 MG PO TABS: ORAL_TABLET | ORAL | 3 refills | Status: DC

## 2020-12-13 NOTE — Telephone Encounter (Signed)
Called to discuss with patient about COVID-19 symptoms and the use of one of the available treatments for those with mild to moderate Covid symptoms and at a high risk of hospitalization.  Pt appears to qualify for outpatient treatment due to co-morbid conditions and/or a member of an at-risk group in accordance with the FDA Emergency Use Authorization.    Symptom onset: 1/19 Vaccinated: No Booster? No Immunocompromised? yes Qualifiers: HTN, afib, hx of breast cancer (in remission) and BMI > 25    Pt is qualified for the monoclonal antibody infusion but declined. She will think out that and give [place] a call back if interested.  Preventative practices reviewed. Patient verbalized understanding.  [place], [place]  12/13/2020 12:40 PM

## 2020-12-13 NOTE — Telephone Encounter (Signed)
Called to Discuss with patient about Covid symptoms and the use of the monoclonal antibody infusion for those with mild to moderate Covid symptoms and at a high risk of hospitalization.     Pt appears to qualify for this infusion due to co-morbid conditions and/or a member of an at-risk group in accordance with the FDA Emergency Use Authorization.    Unable to reach pt. Voicemail left.   Symptom onset: 12/11/20 Vaccinated: No Qualified for Infusion: a-fib, htn, breast ca hx, bmi >25  Alda Lea, NP [facility]  [phone]

## 2020-12-13 NOTE — Progress Notes (Signed)
Electrophysiology TeleHealth Note   Audio telehealth visit is felt to be most appropriate for this patient at this time.  See consent below from today for patient consent regarding telehealth for the Atrial Fibrillation Clinic.    Date:  12/13/2020   ID:  [name] [name], DOB November 20, 1946, MRN [id number]  Location: home Provider location: [address] [place], [place] [address] Evaluation Performed: New patient consult  PCP:  Raina Mina., MD  Primary Cardiologist: Dr Haroldine Laws (remotely)  Primary Electrophysiologist: none     History of Present Illness: [name] [name] is a 75 y.o. female who presents via audio conferencing for a telehealth visit today. The patient is referred for new consultation regarding atrial fibrillation by Dr Stark Jock. She has a PMH of HLD, breat cancer, and anxiety. Patient presented to the ED 12/12/20 with heart racing and dizziness. She also reports she felt like she was "coming down with something." EMS was called and patient was in afib with RVR, given adenosine. She converted to SR before arrival at the ED. She did test positive for COVID-19 at the ED. She has not had further heart racing. She still has a cough and body aches but otherwise feels OK with COVID. She denies significant snoring or alcohol use.   Today, she denies symptoms of palpitations, chest pain, shortness of breath, orthopnea, PND, lower extremity edema, claudication, dizziness, presyncope, syncope, bleeding, or neurologic sequela. The patient is tolerating medications without difficulties and is otherwise without complaint today.    Atrial Fibrillation Risk Factors:  she does not have symptoms or diagnosis of sleep apnea. she does not have a history of rheumatic fever. she does not have a history of alcohol use. The patient does not have a history of early familial atrial fibrillation or other arrhythmias.  she has a BMI of Body mass index is 29.7 kg/m.Marland Kitchen Filed Weights   12/13/20 1007   Weight: 78.5 kg    Past Medical History:  Diagnosis Date  . Anxiety   . Cancer [facility]) 2012   breast- left  . History of breast cancer    s/p lumpectomy and HRT  . Hyperlipidemia    intolerant of all cholesterol meds  . Obesity   . Palpitations    Past Surgical History:  Procedure Laterality Date  . ABDOMINAL HYSTERECTOMY  partial   per pt she still has uterus  . BREAST BIOPSY Right    benign  . BREAST LUMPECTOMY  07/26/07   Left lumpectomy+sln,ER+PR-,Her2-,T1bN0  . CHOLECYSTECTOMY    . CP : ETT 10/11     Medical History. Walked 630 on [address] protocl. normal ECG  . CT RADIATION THERAPY GUIDE    . Left Knee Score    . OOPHORECTOMY  2008  . 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 TOTAL ABDOMINAL HYSTERECTOMY W/ BILATERAL SALPINGOOPHORECTOMY       Current Outpatient Medications  Medication Sig Dispense Refill  . acetaminophen (TYLENOL) 500 MG tablet Take 500 mg by mouth every 6 (six) hours as needed for moderate pain.    Marland Kitchen aspirin 81 MG EC tablet Take 81 mg by mouth every Monday, Wednesday, and Friday. Swallow whole.    . Biotin 10000 MCG TABS Take 10 mcg by mouth daily.    . Cholecalciferol (VITAMIN D3) 125 MCG (5000 UT) TABS Take 5,000 Units by mouth daily.    . vitamin C (ASCORBIC ACID) 500 MG tablet Take 500 mg by mouth daily.    Marland Kitchen  Zinc 30 MG CAPS Take 30 mg by mouth daily.     No current facility-administered medications for this encounter.    Allergies:   Naproxen sodium, Neomycin-bacitracin zn-polymyx, Tetracycline, Apixaban, and Other   Social History:  The patient  reports that she has never smoked. She has never used smokeless tobacco. She reports current alcohol use of about 1.0 standard drink of alcohol per week. She reports that she does not use drugs.   Family History:  The patient's  family history includes Cancer in her father, maternal uncle, and mother.    ROS:  Please see the history of present illness.   All other systems are  personally reviewed and negative.    Recent Labs: 12/11/2020: BUN 12; Creatinine, Ser 0.83; Hemoglobin 13.7; Platelets 220; Potassium 3.7; Sodium 137  personally reviewed    Epic records reviewed at length today.   CHA2DS2-VASc Score = 3  The patient's score is based upon: CHF History: No HTN History: No Diabetes History: No Stroke History: No Vascular Disease History: No Age Score: 2 Gender Score: 1      ASSESSMENT AND PLAN: 1. Paroxysmal Atrial Fibrillation (ICD10:  I48.0) The patient's CHA2DS2-VASc score is 3, indicating a 3.2% annual risk of stroke.   General education about afib provided and questions answered. We also discussed her stroke risk and the risks and benefits of anticoagulation. Will start Xarelto 20 mg daily. Eliquis allergy noted. Will also start diltiazem 30 mg PRN q 4 hours for heart racing.  Check echocardiogram.   2. Secondary Hypercoagulable State (ICD10:  D68.69) The patient is at significant risk for stroke/thromboembolism based upon her CHA2DS2-VASc Score of 3.  Start Rivaroxaban (Xarelto).    Follow-up in the AF clinic in one month.   Current medicines are reviewed at length with the patient today.   The patient does not have concerns regarding her medicines.  The following changes were made today:  Start Xarelto, start diltiazem  Labs/ tests ordered today include: none No orders of the defined types were placed in this encounter.   Patient Risk:  after full review of this patients clinical status, I feel that they are at moderate risk at this time.   Today, I have spent 26 minutes with the patient with telehealth technology discussing the above.    Gwenlyn Perking PA-C 12/13/2020 10:59 AM  Afib [facility] [address] [place], [place] [address] [phone]   I hereby voluntarily request, consent and authorize the [facility] and its employed or contracted physicians, physician assistants,  nurse practitioners or other licensed health care professionals (the Practitioner), to provide me with telemedicine health care services (the "Services") as deemed necessary by the treating Practitioner. I acknowledge and consent to receive the Services by the Practitioner via telemedicine. I understand that the telemedicine visit will involve communicating with the Practitioner through live audiovisual communication technology and the disclosure of certain medical information by electronic transmission. I acknowledge that I have been given the opportunity to request an in-person assessment or other available alternative prior to the telemedicine visit and am voluntarily participating in the telemedicine visit.   I understand that I have the right to withhold or withdraw my consent to the use of telemedicine in the course of my care at any time, without affecting my right to future care or treatment, and that the Practitioner or I may terminate the telemedicine visit at any time. I understand that I have the right to inspect all  information obtained and/or recorded in the course of the telemedicine visit and may receive copies of available information for a reasonable fee.  I understand that some of the potential risks of receiving the Services via telemedicine include:   Delay or interruption in medical evaluation due to technological equipment failure or disruption;  Information transmitted may not be sufficient (e.g. poor resolution of images) to allow for appropriate medical decision making by the Practitioner; and/or  In rare instances, security protocols could fail, causing a breach of personal health information.   Furthermore, I acknowledge that it is my responsibility to provide information about my medical history, conditions and care that is complete and accurate to the best of my ability. I acknowledge that Practitioner's advice, recommendations, and/or decision may be based on factors not within  their control, such as incomplete or inaccurate data provided by me or distortions of diagnostic images or specimens that may result from electronic transmissions. I understand that the practice of medicine is not an exact science and that Practitioner makes no warranties or guarantees regarding treatment outcomes. I acknowledge that I will receive a copy of this consent concurrently upon execution via email to the email address I last provided but may also request a printed copy by calling the office of the [facility].  I understand that my insurance will be billed for this visit.   I have read or had this consent read to me.  I understand the contents of this consent, which adequately explains the benefits and risks of the Services being provided via telemedicine.  I have been provided ample opportunity to ask questions regarding this consent and the Services and have had my questions answered to my satisfaction.  I give my informed consent for the services to be provided through the use of telemedicine in my medical care  By participating in this telemedicine visit I agree to the above.

## 2021-01-02 ENCOUNTER — Other Ambulatory Visit: Payer: Self-pay

## 2021-01-02 ENCOUNTER — Ambulatory Visit (HOSPITAL_COMMUNITY)
Admission: RE | Admit: 2021-01-02 | Discharge: 2021-01-02 | Disposition: A | Discharge status: Home / Self Care | Payer: PPO | Source: Ambulatory Visit | Attending: Physician Assistant | Admitting: Physician Assistant

## 2021-01-02 DIAGNOSIS — I4891 Unspecified atrial fibrillation: Secondary | ICD-10-CM | POA: Diagnosis not present

## 2021-01-02 DIAGNOSIS — E785 Hyperlipidemia, unspecified: Secondary | ICD-10-CM | POA: Insufficient documentation

## 2021-01-02 DIAGNOSIS — I480 Paroxysmal atrial fibrillation: Secondary | ICD-10-CM | POA: Diagnosis not present

## 2021-01-02 DIAGNOSIS — I10 Essential (primary) hypertension: Secondary | ICD-10-CM | POA: Insufficient documentation

## 2021-01-02 LAB — ECHOCARDIOGRAM COMPLETE
Area-P 1/2: 3.27 cm2
S' Lateral: 2.9 cm

## 2021-01-02 NOTE — Progress Notes (Signed)
Echocardiogram 2D Echocardiogram has been performed.  [name] [name] 01/02/2021, 9:26 AM

## 2021-01-13 ENCOUNTER — Encounter (HOSPITAL_COMMUNITY): Payer: Self-pay | Admitting: Physician Assistant

## 2021-01-13 ENCOUNTER — Ambulatory Visit (HOSPITAL_COMMUNITY)
Admission: RE | Admit: 2021-01-13 | Discharge: 2021-01-13 | Disposition: A | Discharge status: Home / Self Care | Payer: PPO | Source: Ambulatory Visit | Attending: Physician Assistant | Admitting: Physician Assistant

## 2021-01-13 ENCOUNTER — Other Ambulatory Visit: Payer: Self-pay

## 2021-01-13 VITALS — BP 128/78 | HR 68 | Ht 64.0 in | Wt 173.4 lb

## 2021-01-13 DIAGNOSIS — Z7901 Long term (current) use of anticoagulants: Secondary | ICD-10-CM | POA: Insufficient documentation

## 2021-01-13 DIAGNOSIS — D6869 Other thrombophilia: Secondary | ICD-10-CM | POA: Diagnosis not present

## 2021-01-13 DIAGNOSIS — Z888 Allergy status to other drugs, medicaments and biological substances status: Secondary | ICD-10-CM | POA: Diagnosis not present

## 2021-01-13 DIAGNOSIS — Z881 Allergy status to other antibiotic agents status: Secondary | ICD-10-CM | POA: Diagnosis not present

## 2021-01-13 DIAGNOSIS — I480 Paroxysmal atrial fibrillation: Secondary | ICD-10-CM | POA: Diagnosis not present

## 2021-01-13 DIAGNOSIS — Z886 Allergy status to analgesic agent status: Secondary | ICD-10-CM | POA: Diagnosis not present

## 2021-01-13 DIAGNOSIS — Z79899 Other long term (current) drug therapy: Secondary | ICD-10-CM | POA: Insufficient documentation

## 2021-01-13 MED ORDER — RIVAROXABAN 20 MG PO TABS: 20.0000 mg | ORAL_TABLET | Freq: Every day | ORAL | 2 refills | Status: DC

## 2021-01-13 NOTE — Patient Instructions (Signed)
Stop aspirin  Start Xarelto 20mg  once a day

## 2021-01-13 NOTE — Progress Notes (Signed)
Primary Care Physician: Raina Mina., MD Primary Cardiologist: Dr Haroldine Laws (remotely) Primary Electrophysiologist: none Referring Physician: Dr Cordie Grice [name] [name] is a 75 y.o. female with a history of HLD, breat cancer, and anxiety who presents for consultation in the [facility]. Patient presented to the ED 12/12/20 with heart racing and dizziness. She also reports she felt like she was "coming down with something." EMS was called and patient was in afib with RVR, given adenosine. She converted to SR before arrival at the ED. She did test positive for COVID-19 at the ED. She denies significant snoring or alcohol use. Patient was prescribed Xarelto for a CHADS2VASC score of 3. She reports that she never started Xarelto and has just been on ASA. She has not had any heart racing or palpitations.   Today, she denies symptoms of palpitations, chest pain, shortness of breath, orthopnea, PND, lower extremity edema, dizziness, presyncope, syncope, snoring, daytime somnolence, bleeding, or neurologic sequela. The patient is tolerating medications without difficulties and is otherwise without complaint today.    Atrial Fibrillation Risk Factors:  she does not have symptoms or diagnosis of sleep apnea. she does not have a history of rheumatic fever. she does not have a history of alcohol use. The patient does not have a history of early familial atrial fibrillation or other arrhythmias.  she has a BMI of Body mass index is 29.76 kg/m.Marland Kitchen Filed Weights   01/13/21 0912  Weight: 78.7 kg    Family History  Problem Relation Age of Onset  . Cancer Mother        liver  . Cancer Father        bone  . Cancer Maternal Uncle        colon  . Coronary artery disease Neg Hx      Atrial Fibrillation Management history:  Previous antiarrhythmic drugs: none Previous cardioversions: none Previous ablations: none CHADS2VASC score: 3 Anticoagulation history:  Xarelto   Past Medical History:  Diagnosis Date  . Anxiety   . Cancer [facility]) 2012   breast- left  . History of breast cancer    s/p lumpectomy and HRT  . Hyperlipidemia    intolerant of all cholesterol meds  . Obesity   . Palpitations    Past Surgical History:  Procedure Laterality Date  . ABDOMINAL HYSTERECTOMY  partial   per pt she still has uterus  . BREAST BIOPSY Right    benign  . BREAST LUMPECTOMY  07/26/07   Left lumpectomy+sln,ER+PR-,Her2-,T1bN0  . CHOLECYSTECTOMY    . CP : ETT 10/11     Medical History. Walked 630 on [address] protocl. normal ECG  . CT RADIATION THERAPY GUIDE    . Left Knee Score    . OOPHORECTOMY  2008  . 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 TOTAL ABDOMINAL HYSTERECTOMY W/ BILATERAL SALPINGOOPHORECTOMY      Current Outpatient Medications  Medication Sig Dispense Refill  . acetaminophen (TYLENOL) 500 MG tablet Take 500 mg by mouth every 6 (six) hours as needed for moderate pain.    Marland Kitchen aspirin EC 81 MG tablet Take 81 mg by mouth daily. Swallow whole.    . Biotin 10000 MCG TABS Take 10 mcg by mouth daily.    . Cholecalciferol (VITAMIN D3) 125 MCG (5000 UT) TABS Take 5,000 Units by mouth daily.    . COLLAGEN-VITAMIN C-BIOTIN PO Take by mouth.    . diltiazem (CARDIZEM) 30 MG tablet Take 1  tablet every 4 hours AS NEEDED for heart rate >100 as long as top blood pressure >100. 30 tablet 3  . vitamin C (ASCORBIC ACID) 500 MG tablet Take 500 mg by mouth daily.    . Zinc 30 MG CAPS Take 30 mg by mouth daily.     No current facility-administered medications for this encounter.    Allergies  Allergen Reactions  . Naproxen Sodium Hives  . Neomycin-Bacitracin Zn-Polymyx Swelling    In ear, where it was applied.   . Tetracycline Rash    All over the body  . Apixaban Hives  . Other Rash    ALL TAPES-RASH (PT HAS LEAST RXN TO PAPER TAPE)    Social History   Socioeconomic History  . Marital status: Widowed    Spouse name: Not on file   . Number of children: 3  . Years of education: Not on file  . Highest education level: Not on file  Occupational History  . Occupation: Retired Marine scientist  Tobacco Use  . Smoking status: Never Smoker  . Smokeless tobacco: Never Used  Substance and Sexual Activity  . Alcohol use: Yes    Alcohol/week: 1.0 standard drink    Types: 1 Glasses of wine per week    Comment: occasional  . Drug use: No  . Sexual activity: Not on file  Other Topics Concern  . Not on file  Social History Narrative  . Not on file   Social Determinants of Health   Financial Resource Strain: Not on file  Food Insecurity: Not on file  Transportation Needs: Not on file  Physical Activity: Not on file  Stress: Not on file  Social Connections: Not on file  Intimate Partner Violence: Not on file     ROS- All systems are reviewed and negative except as per the HPI above.  Physical Exam: Vitals:   01/13/21 0912  BP: 128/78  Pulse: 68  Weight: 78.7 kg  Height: _0  (1.626 m)    GEN- The patient is well appearing elderly female, alert and oriented x 3 today.   HEENT-head normocephalic, atraumatic, sclera clear, conjunctiva pink, hearing intact, trachea midline. Lungs- Clear to ausculation bilaterally, normal work of breathing Heart- Regular rate and rhythm, no murmurs, rubs or gallops  GI- soft, NT, ND, + BS Extremities- no clubbing, cyanosis, or edema MS- no significant deformity or atrophy Skin- no rash or lesion Psych- euthymic mood, full affect Neuro- strength and sensation are intact   Wt Readings from Last 3 Encounters:  01/13/21 78.7 kg  12/13/20 78.5 kg  12/11/20 79.4 kg    EKG today demonstrates  SR Vent. rate 68 BPM PR interval 148 ms QRS duration 84 ms QT/QTc 410/435 ms  Echo 01/02/21 demonstrated  1. Left ventricular ejection fraction, by estimation, is 55 to 60%. The  left ventricle has normal function. The left ventricle has no regional  wall motion abnormalities. Left  ventricular diastolic parameters are  consistent with Grade I diastolic  dysfunction (impaired relaxation).  2. Right ventricular systolic function is normal. The right ventricular  size is normal. There is normal pulmonary artery systolic pressure.  3. The mitral valve is normal in structure. Mild mitral valve  regurgitation.  4. The aortic valve is tricuspid. Aortic valve regurgitation is not  visualized. Mild aortic valve sclerosis is present, with no evidence of  aortic valve stenosis.  5. The inferior vena cava is normal in size with greater than 50%  respiratory variability, suggesting right atrial pressure  of 3 mmHg.   Epic records are reviewed at length today  CHA2DS2-VASc Score = 3  The patient's score is based upon: CHF History: No HTN History: No Diabetes History: No Stroke History: No Vascular Disease History: No Age Score: 2 Gender Score: 1      ASSESSMENT AND PLAN: 1. Paroxysmal Atrial Fibrillation (ICD10:  I48.0) The patient's CHA2DS2-VASc score is 3, indicating a 3.2% annual risk of stroke.   Patient appears to be maintaining SR. He had a long discussion about the risks and benefits of anticoagulation.  She will start Xarelto 20 mg daily and stop ASA. Continue diltiazem 30 mg PRN q 4 hours for heart racing.   2. Secondary Hypercoagulable State (ICD10:  D68.69) The patient is at significant risk for stroke/thromboembolism based upon her CHA2DS2-VASc Score of 3.  Continue Rivaroxaban (Xarelto).    Follow up in the AF clinic in 3 months.    [facility] [address] [place], [place] [address] [phone] 01/13/2021 9:21 AM

## 2021-02-05 DIAGNOSIS — I499 Cardiac arrhythmia, unspecified: Secondary | ICD-10-CM | POA: Diagnosis not present

## 2021-02-05 DIAGNOSIS — R0602 Shortness of breath: Secondary | ICD-10-CM | POA: Diagnosis not present

## 2021-02-05 DIAGNOSIS — R42 Dizziness and giddiness: Secondary | ICD-10-CM | POA: Diagnosis not present

## 2021-02-05 DIAGNOSIS — I213 ST elevation (STEMI) myocardial infarction of unspecified site: Secondary | ICD-10-CM | POA: Diagnosis not present

## 2021-02-05 DIAGNOSIS — I4891 Unspecified atrial fibrillation: Secondary | ICD-10-CM | POA: Diagnosis not present

## 2021-04-07 DIAGNOSIS — H6121 Impacted cerumen, right ear: Secondary | ICD-10-CM | POA: Diagnosis not present

## 2021-04-07 DIAGNOSIS — Z2821 Immunization not carried out because of patient refusal: Secondary | ICD-10-CM | POA: Diagnosis not present

## 2021-04-07 DIAGNOSIS — I480 Paroxysmal atrial fibrillation: Secondary | ICD-10-CM | POA: Diagnosis not present

## 2021-04-14 ENCOUNTER — Ambulatory Visit (HOSPITAL_COMMUNITY): Payer: PPO | Admitting: Physician Assistant

## 2021-04-22 ENCOUNTER — Telehealth (HOSPITAL_COMMUNITY): Payer: Self-pay | Admitting: Vascular Surgery

## 2021-04-22 NOTE — Telephone Encounter (Signed)
Faxed letter to referring Adele Dan at [facility]) office that pt is not a appropriate for the heart failure clinic

## 2021-06-30 ENCOUNTER — Ambulatory Visit (HOSPITAL_BASED_OUTPATIENT_CLINIC_OR_DEPARTMENT_OTHER): Payer: PPO | Admitting: Cardiology

## 2021-07-18 ENCOUNTER — Other Ambulatory Visit: Payer: Self-pay | Admitting: General Surgery

## 2021-07-18 DIAGNOSIS — Z1231 Encounter for screening mammogram for malignant neoplasm of breast: Secondary | ICD-10-CM

## 2021-08-29 ENCOUNTER — Other Ambulatory Visit: Payer: Self-pay

## 2021-08-29 ENCOUNTER — Ambulatory Visit
Admission: RE | Admit: 2021-08-29 | Discharge: 2021-08-29 | Disposition: A | Discharge status: Home / Self Care | Payer: PPO | Source: Ambulatory Visit | Attending: General Surgery | Admitting: General Surgery

## 2021-08-29 DIAGNOSIS — Z1231 Encounter for screening mammogram for malignant neoplasm of breast: Secondary | ICD-10-CM

## 2021-12-30 ENCOUNTER — Emergency Department (HOSPITAL_COMMUNITY)
Admission: EM | Admit: 2021-12-30 | Discharge: 2021-12-30 | Disposition: A | Discharge status: Home / Self Care | Payer: PPO | Attending: Emergency Medicine | Admitting: Emergency Medicine

## 2021-12-30 ENCOUNTER — Encounter (HOSPITAL_COMMUNITY): Payer: Self-pay | Admitting: Emergency Medicine

## 2021-12-30 ENCOUNTER — Emergency Department (HOSPITAL_COMMUNITY): Payer: PPO

## 2021-12-30 DIAGNOSIS — I4891 Unspecified atrial fibrillation: Secondary | ICD-10-CM

## 2021-12-30 DIAGNOSIS — R000 Tachycardia, unspecified: Secondary | ICD-10-CM | POA: Diagnosis present

## 2021-12-30 DIAGNOSIS — E860 Dehydration: Secondary | ICD-10-CM | POA: Diagnosis not present

## 2021-12-30 DIAGNOSIS — R079 Chest pain, unspecified: Secondary | ICD-10-CM | POA: Diagnosis not present

## 2021-12-30 DIAGNOSIS — Z79899 Other long term (current) drug therapy: Secondary | ICD-10-CM | POA: Diagnosis not present

## 2021-12-30 DIAGNOSIS — R0689 Other abnormalities of breathing: Secondary | ICD-10-CM | POA: Diagnosis not present

## 2021-12-30 DIAGNOSIS — I480 Paroxysmal atrial fibrillation: Secondary | ICD-10-CM | POA: Diagnosis not present

## 2021-12-30 DIAGNOSIS — R197 Diarrhea, unspecified: Secondary | ICD-10-CM | POA: Insufficient documentation

## 2021-12-30 DIAGNOSIS — I499 Cardiac arrhythmia, unspecified: Secondary | ICD-10-CM | POA: Diagnosis not present

## 2021-12-30 DIAGNOSIS — I700 Atherosclerosis of aorta: Secondary | ICD-10-CM | POA: Diagnosis not present

## 2021-12-30 DIAGNOSIS — I491 Atrial premature depolarization: Secondary | ICD-10-CM | POA: Diagnosis not present

## 2021-12-30 DIAGNOSIS — R0789 Other chest pain: Secondary | ICD-10-CM | POA: Diagnosis not present

## 2021-12-30 LAB — BASIC METABOLIC PANEL
Anion gap: 13 (ref 5–15)
BUN: 13 mg/dL (ref 8–23)
CO2: 17 mmol/L — ABNORMAL LOW (ref 22–32)
Calcium: 8.7 mg/dL — ABNORMAL LOW (ref 8.9–10.3)
Chloride: 106 mmol/L (ref 98–111)
Creatinine, Ser: 0.83 mg/dL (ref 0.44–1.00)
GFR, Estimated: >60 mL/min (ref >60)
Glucose, Bld: 94 mg/dL (ref 70–99)
Potassium: 4.4 mmol/L (ref 3.5–5.1)
Sodium: 136 mmol/L (ref 135–145)

## 2021-12-30 LAB — I-STAT CHEM 8, ED
BUN: 16 mg/dL (ref 8–23)
Calcium, Ion: 1.02 mmol/L — ABNORMAL LOW (ref 1.15–1.40)
Chloride: 108 mmol/L (ref 98–111)
Creatinine, Ser: 0.6 mg/dL (ref 0.44–1.00)
Glucose, Bld: 97 mg/dL (ref 70–99)
HCT: 42 % (ref 36.0–46.0)
Hemoglobin: 14.3 g/dL (ref 12.0–15.0)
Potassium: 4.3 mmol/L (ref 3.5–5.1)
Sodium: 137 mmol/L (ref 135–145)
TCO2: 20 mmol/L — ABNORMAL LOW (ref 22–32)

## 2021-12-30 LAB — CBC
HCT: 43.4 % (ref 36.0–46.0)
Hemoglobin: 14.1 g/dL (ref 12.0–15.0)
MCH: 30.4 pg (ref 26.0–34.0)
MCHC: 32.5 g/dL (ref 30.0–36.0)
MCV: 93.5 fL (ref 80.0–100.0)
Platelets: 252 10*3/uL (ref 150–400)
RBC: 4.64 MIL/uL (ref 3.87–5.11)
RDW: 13.5 % (ref 11.5–15.5)
WBC: 8.5 10*3/uL (ref 4.0–10.5)
nRBC: 0 % (ref 0.0–0.2)

## 2021-12-30 LAB — PROTIME-INR
INR: 1.1 (ref 0.8–1.2)
Prothrombin Time: 14 seconds (ref 11.4–15.2)

## 2021-12-30 LAB — TROPONIN I (HIGH SENSITIVITY)
Troponin I (High Sensitivity): 9 ng/L (ref <18)
Troponin I (High Sensitivity): 14 ng/L (ref <18)

## 2021-12-30 MED ORDER — DILTIAZEM LOAD VIA INFUSION
10.0000 mg | Freq: Once | INTRAVENOUS | Status: AC
  Administered 2021-12-30: 10 mg via INTRAVENOUS

## 2021-12-30 MED ORDER — DILTIAZEM HCL-DEXTROSE 125-5 MG/125ML-% IV SOLN (PREMIX)
5.0000 mg/h | INTRAVENOUS | Status: DC
  Administered 2021-12-30: 5 mg/h via INTRAVENOUS

## 2021-12-30 MED ORDER — DILTIAZEM HCL-DEXTROSE 125-5 MG/125ML-% IV SOLN (PREMIX)
INTRAVENOUS | Status: AC
  Administered 2021-12-30: 5 mg/h via INTRAVENOUS
  Filled 2021-12-30: qty 125

## 2021-12-30 MED ORDER — DILTIAZEM HCL ER COATED BEADS 120 MG PO CP24
120.0000 mg | ORAL_CAPSULE | Freq: Once | ORAL | Status: AC
  Administered 2021-12-30: 120 mg via ORAL
  Filled 2021-12-30: qty 1

## 2021-12-30 MED ORDER — DILTIAZEM HCL ER COATED BEADS 120 MG PO CP24
120.0000 mg | ORAL_CAPSULE | Freq: Every day | ORAL | 3 refills | Status: DC
Start: 2021-12-30 — End: 2022-01-08

## 2021-12-30 MED ORDER — DILTIAZEM HCL ER COATED BEADS 120 MG PO CP24: 120.0000 mg | ORAL_CAPSULE | Freq: Every day | ORAL | 3 refills | Status: DC

## 2021-12-30 NOTE — Progress Notes (Signed)
° °  Pt came in w/ rapid Afib >> converted spontaneously w/ IV Cardizem.  Pt has been non-compliant w/ meds/anticoag in the past.  Recommended she be d/c'd on Cardizem CD 120 mg qd, message sent for f/u appt.   Rosaria Ferries, PA-C 12/30/2021 6:47 PM

## 2021-12-30 NOTE — ED Provider Notes (Signed)
[facility] EMERGENCY DEPARTMENT Provider Note   CSN: [id number] Arrival date & time: 12/30/21  1723     History  Chief Complaint  Patient presents with   Tachycardia    [name] [name] is a 76 y.o. female.  Pt complains of atrial fibrillation today.  Pt reports she has had in the past.  Pt reports she has been seen at Afib clinic.  Pt is not currently taking cardizem for eliquis.  Pt reports she does not like taking medications.  Pt was brought to the ED by [facility].  Pt was given Cardizem and adenosine.  Pt reports adenosine made her feel bad.  Pt reports she has 2 episodes of diarrhea today and thinks this may have triggered her afib.    The history is provided by the patient. No language interpreter was used.      Home Medications Prior to Admission medications   Medication Sig Start Date End Date Taking? Authorizing Provider  acetaminophen (TYLENOL) 500 MG tablet Take 500 mg by mouth every 6 (six) hours as needed for moderate pain.    [provider]  Biotin 10000 MCG TABS Take 10 mcg by mouth daily.    [provider]  Cholecalciferol (VITAMIN D3) 125 MCG (5000 UT) TABS Take 5,000 Units by mouth daily.    [provider]  COLLAGEN-VITAMIN C-BIOTIN PO Take by mouth.    [provider]  diltiazem (CARDIZEM CD) 120 MG 24 hr capsule Take 1 capsule (120 mg total) by mouth daily. 12/30/21 12/30/22  Fransico Meadow, PA-C  diltiazem (CARDIZEM) 30 MG tablet Take 1 tablet every 4 hours AS NEEDED for heart rate >100 as long as top blood pressure >100. 12/13/20   Fenton, Clint R, PA  rivaroxaban (XARELTO) 20 MG TABS tablet Take 1 tablet (20 mg total) by mouth daily with supper. STOP ASPIRIN 01/13/21   Fenton, Clint R, PA  vitamin C (ASCORBIC ACID) 500 MG tablet Take 500 mg by mouth daily.    [provider]  Zinc 30 MG CAPS Take 30 mg by mouth daily.    [provider]      Allergies    Naproxen sodium, Neomycin-bacitracin  zn-polymyx, Tetracycline, Apixaban, and Other    Review of Systems   Review of Systems  All other systems reviewed and are negative.  Physical Exam Updated Vital Signs BP 116/61 (BP Location: Right Arm)    Pulse (!) 54    Temp 98.1 F (36.7 C) (Oral)    Resp 12    Ht 5\' 4"  (1.626 m)    Wt 72.6 kg    SpO2 100%    BMI 27.46 kg/m  Physical Exam Vitals and nursing note reviewed.  Constitutional:      Appearance: She is well-developed.  HENT:     Head: Normocephalic.     Nose: Nose normal.  Cardiovascular:     Rate and Rhythm: Tachycardia present.     Pulses: Normal pulses.     Heart sounds: Normal heart sounds.  Pulmonary:     Effort: Pulmonary effort is normal.  Abdominal:     General: Abdomen is flat. There is no distension.  Musculoskeletal:        General: Normal range of motion.     Cervical back: Normal range of motion.  Neurological:     General: No focal deficit present.     Mental Status: She is alert and oriented to person, place, and time.  Psychiatric:  Mood and Affect: Mood normal.    ED Results / Procedures / Treatments   Labs (all labs ordered are listed, but only abnormal results are displayed) Labs Reviewed  BASIC METABOLIC PANEL - Abnormal; Notable for the following components:      Result Value   CO2 17 (*)    Calcium 8.7 (*)    All other components within normal limits  I-STAT CHEM 8, ED - Abnormal; Notable for the following components:   Calcium, Ion 1.02 (*)    TCO2 20 (*)    All other components within normal limits  CBC  PROTIME-INR  TROPONIN I (HIGH SENSITIVITY)  TROPONIN I (HIGH SENSITIVITY)    EKG None  Radiology DG Chest Portable 1 View  Result Date: 12/30/2021 CLINICAL DATA:  SVT.  Irregular rhythm. EXAM: PORTABLE CHEST 1 VIEW COMPARISON:  12/11/2020 FINDINGS: The heart size and mediastinal contours are within normal limits. Both lungs are clear. The visualized skeletal structures are unremarkable. Calcification of the aorta.  Surgical clips in the right upper quadrant. IMPRESSION: No active disease. Electronically Signed   By: Lucienne Capers M.D.   On: 12/30/2021 18:10    Procedures Procedures    Medications Ordered in ED Medications  diltiazem (CARDIZEM) 1 mg/mL load via infusion 10 mg (10 mg Intravenous Bolus from Bag 12/30/21 1805)    And  diltiazem (CARDIZEM) 125 mg in dextrose 5% 125 mL (1 mg/mL) infusion (0 mg/hr Intravenous Stopped 12/30/21 2054)  diltiazem (CARDIZEM CD) 24 hr capsule 120 mg (120 mg Oral Given 12/30/21 1946)    ED Course/ Medical Decision Making/ A&P                           Medical Decision Making Problems Addressed: Atrial fibrillation with rapid ventricular response [facility]): acute illness or injury    Details: Pt has a history of afib,  Pt noncompliant with medications  Amount and/or Complexity of Data Reviewed Independent Historian: EMS    Details: EMS gave cardizem without cahnge External Data Reviewed: notes.    Details: Afib clinic notes Labs: ordered. Decision-making details documented in ED Course. Radiology: ordered. ECG/medicine tests: ordered and independent interpretation performed. Decision-making details documented in ED Course.  Risk Prescription drug management.   Pt given cardizem 10 mg and placed on cardizem drip.  Pt converted to sinus rhythm at 75.  Pt reports she feels much better.  Pt states she wants to go home.  I discussed pt with Cardiology  Rhonda Barrett who advised cardizem 120cd. She called afib clinic and will have pt scheduled to be seen.  Pt given rx for cardizem cd. Pt advised to asa as she will not take eliquis.          Final Clinical Impression(s) / ED Diagnoses Final diagnoses:  Atrial fibrillation with rapid ventricular response (Theresa)    Rx / DC Orders ED Discharge Orders          Ordered    diltiazem (CARDIZEM CD) 120 MG 24 hr capsule  Daily,   Status:  Discontinued        12/30/21 2234    diltiazem (CARDIZEM CD) 120 MG 24 hr  capsule  Daily        12/30/21 2306              Sidney Ace 12/30/21 2332    Regan Lemming, MD 12/30/21 [phone]

## 2021-12-30 NOTE — ED Notes (Signed)
PA at bedside.

## 2021-12-30 NOTE — Discharge Instructions (Addendum)
Take a baby asa if you are not taking eliquis

## 2021-12-30 NOTE — ED Notes (Signed)
Lab called to check on pts labs that were sent at 1740 and still not in process. Lab receiving them now.

## 2021-12-30 NOTE — ED Triage Notes (Signed)
Pt arrives via EMS with reports of SVT and irregular heart rhythm. Pt reports she had 2 episodes of diarrhea today and dizziness. Hx of Afib, takes cardizem PRN. Pt not taking blood thinner since last April/May. Pt given 10 mg cardizem and 6 mg adenosine in truck.

## 2021-12-30 NOTE — ED Notes (Signed)
Patient ambulated to restroom safely with no assistance needed.

## 2022-01-08 ENCOUNTER — Ambulatory Visit (HOSPITAL_COMMUNITY)
Admission: RE | Admit: 2022-01-08 | Discharge: 2022-01-08 | Disposition: A | Discharge status: Home / Self Care | Payer: PPO | Source: Ambulatory Visit | Attending: Physician Assistant | Admitting: Physician Assistant

## 2022-01-08 ENCOUNTER — Encounter (HOSPITAL_COMMUNITY): Payer: Self-pay | Admitting: Physician Assistant

## 2022-01-08 ENCOUNTER — Other Ambulatory Visit: Payer: Self-pay

## 2022-01-08 VITALS — BP 138/72 | HR 63 | Ht 64.0 in | Wt 165.6 lb

## 2022-01-08 DIAGNOSIS — Z853 Personal history of malignant neoplasm of breast: Secondary | ICD-10-CM | POA: Diagnosis not present

## 2022-01-08 DIAGNOSIS — Z7982 Long term (current) use of aspirin: Secondary | ICD-10-CM | POA: Insufficient documentation

## 2022-01-08 DIAGNOSIS — Z8616 Personal history of COVID-19: Secondary | ICD-10-CM | POA: Insufficient documentation

## 2022-01-08 DIAGNOSIS — F419 Anxiety disorder, unspecified: Secondary | ICD-10-CM | POA: Diagnosis not present

## 2022-01-08 DIAGNOSIS — D6869 Other thrombophilia: Secondary | ICD-10-CM | POA: Insufficient documentation

## 2022-01-08 DIAGNOSIS — I480 Paroxysmal atrial fibrillation: Secondary | ICD-10-CM | POA: Diagnosis not present

## 2022-01-08 DIAGNOSIS — E785 Hyperlipidemia, unspecified: Secondary | ICD-10-CM | POA: Diagnosis not present

## 2022-01-08 MED ORDER — DILTIAZEM HCL ER COATED BEADS 120 MG PO CP24: 120.0000 mg | ORAL_CAPSULE | Freq: Every day | ORAL | 3 refills | Status: DC

## 2022-01-08 NOTE — Progress Notes (Signed)
Primary Care Physician: Aretta Nip, MD Primary Cardiologist: Dr Haroldine Laws (remotely) Primary Electrophysiologist: none Referring Physician: Dr Cordie Grice [name] [name] is a 76 y.o. female with a history of HLD, breat cancer, and anxiety who presents for follow up in the [facility]. Patient presented to the ED 12/12/20 with heart racing and dizziness. She also reports she felt like she was "coming down with something." EMS was called and patient was in afib with RVR, given adenosine. She converted to SR before arrival at the ED. She did test positive for COVID-19 at the ED. She denies significant snoring or alcohol use. Patient was prescribed Xarelto for a CHADS2VASC score of 3. She reports that she never started Xarelto and has just been on ASA.   On follow up today, patient presented to the ED 12/30/21 via EMS for irregular heart beat and dizziness. She converted to SR with diltiazem at the ED. She was prescribed diltiazem at discharge but has not started this. She has not been taking Xarelto.   Today, she denies symptoms of palpitations, chest pain, shortness of breath, orthopnea, PND, lower extremity edema, dizziness, presyncope, syncope, snoring, daytime somnolence, bleeding, or neurologic sequela. The patient is tolerating medications without difficulties and is otherwise without complaint today.    Atrial Fibrillation Risk Factors:  she does not have symptoms or diagnosis of sleep apnea. she does not have a history of rheumatic fever. she does not have a history of alcohol use. The patient does not have a history of early familial atrial fibrillation or other arrhythmias.  she has a BMI of Body mass index is 28.43 kg/m.Marland Kitchen Filed Weights   01/08/22 1018  Weight: 75.1 kg     Family History  Problem Relation Age of Onset   Cancer Mother        liver   Cancer Father        bone   Cancer Maternal Uncle        colon   Coronary artery disease Neg Hx       Atrial Fibrillation Management history:  Previous antiarrhythmic drugs: none Previous cardioversions: none Previous ablations: none CHADS2VASC score: 3 Anticoagulation history: Xarelto (never started), Eliquis (side effects)   Past Medical History:  Diagnosis Date   Anxiety    Cancer ([place]) 2012   breast- left   History of breast cancer    s/p lumpectomy and HRT   Hyperlipidemia    intolerant of all cholesterol meds   Obesity    Palpitations    Past Surgical History:  Procedure Laterality Date   ABDOMINAL HYSTERECTOMY  partial   per pt she still has uterus   BREAST BIOPSY Right 2011   benign   BREAST LUMPECTOMY  07/26/2007   Left lumpectomy+sln,ER+PR-,Her2-,T1bN0   CHOLECYSTECTOMY     CP : ETT 10/11     Medical History. Walked 630 on [place] protocl. 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     Biotin 10000 MCG TABS Take 10 mcg by mouth daily.     Cholecalciferol (VITAMIN D3) 125 MCG (5000 UT) TABS  Take 5,000 Units by mouth daily.     COLLAGEN-VITAMIN C-BIOTIN PO Take by mouth.     diltiazem (CARDIZEM) 30 MG tablet Take 1 tablet every 4 hours AS NEEDED for heart rate >100 as long as top blood pressure >100. 30 tablet 3   vitamin C (ASCORBIC ACID) 500 MG tablet Take 500 mg by mouth daily.     Zinc 30 MG CAPS Take 30 mg by mouth daily.     diltiazem (CARDIZEM CD) 120 MG 24 hr capsule Take 1 capsule (120 mg total) by mouth daily. 30 capsule 3   No current facility-administered medications for this encounter.    Allergies  Allergen Reactions   Naproxen Sodium Hives   Neomycin-Bacitracin Zn-Polymyx Swelling    In ear, where  it was applied.    Tetracycline Rash    All over the body   Apixaban Hives   Other Rash    ALL TAPES-RASH (PT HAS LEAST RXN TO PAPER TAPE)    Social History   Socioeconomic History   Marital status: Widowed    Spouse name: Not on file   Number of children: 3   Years of education: Not on file   Highest education level: Not on file  Occupational History   Occupation: Retired Marine scientist  Tobacco Use   Smoking status: Never   Smokeless tobacco: Never  Substance and Sexual Activity   Alcohol use: Yes    Alcohol/week: 1.0 standard drink    Types: 1 Glasses of wine per week    Comment: occasional   Drug use: No   Sexual activity: Not on file  Other Topics Concern   Not on file  Social History Narrative   Not on file   Social Determinants of Health   Financial Resource Strain: Not on file  Food Insecurity: Not on file  Transportation Needs: Not on file  Physical Activity: Not on file  Stress: Not on file  Social Connections: Not on file  Intimate Partner Violence: Not on file     ROS- All systems are reviewed and negative except as per the HPI above.  Physical Exam: Vitals:   01/08/22 1018  BP: 138/72  Pulse: 63  Weight: 75.1 kg  Height: _0  (1.626 m)    GEN- The patient is a well appearing elderly female, alert and oriented x 3 today.   HEENT-head normocephalic, atraumatic, sclera clear, conjunctiva pink, hearing intact, trachea midline. Lungs- Clear to ausculation bilaterally, normal work of breathing Heart- Regular rate and rhythm, no murmurs, rubs or gallops  GI- soft, NT, ND, + BS Extremities- no clubbing, cyanosis, or edema MS- no significant deformity or atrophy Skin- no rash or lesion Psych- euthymic mood, full affect Neuro- strength and sensation are intact   Wt Readings from Last 3 Encounters:  01/08/22 75.1 kg  12/30/21 72.6 kg  01/13/21 78.7 kg    EKG today demonstrates  SR Vent. rate 63 BPM PR interval 152 ms QRS duration 84 ms QT/QTcB  412/421 ms  Echo 01/02/21 demonstrated  1. Left ventricular ejection fraction, by estimation, is 55 to 60%. The  left ventricle has normal function. The left ventricle has no regional  wall motion abnormalities. Left ventricular diastolic parameters are  consistent with Grade I diastolic dysfunction (impaired relaxation).   2. Right ventricular systolic function is normal. The right ventricular  size is normal. There is normal pulmonary artery systolic pressure.   3. The mitral valve is normal in structure. Mild mitral valve  regurgitation.  4. The aortic valve is tricuspid. Aortic valve regurgitation is not  visualized. Mild aortic valve sclerosis is present, with no evidence of  aortic valve stenosis.   5. The inferior vena cava is normal in size with greater than 50%  respiratory variability, suggesting right atrial pressure of 3 mmHg.   Epic records are reviewed at length today  CHA2DS2-VASc Score = 3  The patient's score is based upon: CHF History: 0 HTN History: 0 Diabetes History: 0 Stroke History: 0 Vascular Disease History: 0 Age Score: 2 Gender Score: 1        ASSESSMENT AND PLAN: 1. Paroxysmal Atrial Fibrillation (ICD10:  I48.0) The patient's CHA2DS2-VASc score is 3, indicating a 3.2% annual risk of stroke.   After discussing the risks and benefits of diltiazem, patient agreeable to starting 120 mg daily with 30 mg PRN q 4 hours for heart racing.  We again discussed the risks and benefits of anticoagulation. We discussed her stroke risk and she continues to decline anticoagulation. She voices understanding of her increased risk of significant CVA.  2. Secondary Hypercoagulable State (ICD10:  D68.69) The patient is at significant risk for stroke/thromboembolism based upon her CHA2DS2-VASc Score of 3.  Continue Rivaroxaban (Xarelto).    Follow up in the AF clinic in 3 months.    [facility] [address] [place], [place] [address] [phone] 01/08/2022 10:46 AM

## 2022-01-12 DIAGNOSIS — H6120 Impacted cerumen, unspecified ear: Secondary | ICD-10-CM | POA: Diagnosis not present

## 2022-01-12 DIAGNOSIS — Z9114 Patient's other noncompliance with medication regimen: Secondary | ICD-10-CM | POA: Diagnosis not present

## 2022-01-12 DIAGNOSIS — I480 Paroxysmal atrial fibrillation: Secondary | ICD-10-CM | POA: Diagnosis not present

## 2022-01-20 DIAGNOSIS — I480 Paroxysmal atrial fibrillation: Secondary | ICD-10-CM | POA: Diagnosis not present

## 2022-01-20 DIAGNOSIS — Z136 Encounter for screening for cardiovascular disorders: Secondary | ICD-10-CM | POA: Diagnosis not present

## 2022-04-09 ENCOUNTER — Encounter (HOSPITAL_COMMUNITY): Payer: Self-pay | Admitting: Physician Assistant

## 2022-04-09 ENCOUNTER — Ambulatory Visit (HOSPITAL_COMMUNITY)
Admission: RE | Admit: 2022-04-09 | Discharge: 2022-04-09 | Disposition: A | Discharge status: Home / Self Care | Payer: PPO | Source: Ambulatory Visit | Attending: Physician Assistant | Admitting: Physician Assistant

## 2022-04-09 VITALS — BP 132/86 | HR 62 | Ht 64.0 in | Wt 160.0 lb

## 2022-04-09 DIAGNOSIS — I480 Paroxysmal atrial fibrillation: Secondary | ICD-10-CM | POA: Diagnosis not present

## 2022-04-09 DIAGNOSIS — Z7901 Long term (current) use of anticoagulants: Secondary | ICD-10-CM | POA: Insufficient documentation

## 2022-04-09 DIAGNOSIS — E785 Hyperlipidemia, unspecified: Secondary | ICD-10-CM | POA: Diagnosis not present

## 2022-04-09 DIAGNOSIS — F419 Anxiety disorder, unspecified: Secondary | ICD-10-CM | POA: Insufficient documentation

## 2022-04-09 DIAGNOSIS — D6869 Other thrombophilia: Secondary | ICD-10-CM | POA: Insufficient documentation

## 2022-04-09 DIAGNOSIS — C7981 Secondary malignant neoplasm of breast: Secondary | ICD-10-CM | POA: Diagnosis not present

## 2022-04-09 MED ORDER — DILTIAZEM HCL 30 MG PO TABS: ORAL_TABLET | ORAL | 3 refills | Status: DC

## 2022-04-09 NOTE — Progress Notes (Addendum)
Primary Care Physician: Aretta Nip, MD Primary Cardiologist: Dr Haroldine Laws (remotely) Primary Electrophysiologist: none Referring Physician: Dr Cordie Grice [name] [name] is a 76 y.o. female with a history of HLD, breat cancer, and anxiety who presents for follow up in the [facility]. Patient presented to the ED 12/12/20 with heart racing and dizziness. She also reports she felt like she was "coming down with something." EMS was called and patient was in afib with RVR, given adenosine. She converted to SR before arrival at the ED. She did test positive for COVID-19 at the ED. She denies significant snoring or alcohol use. Patient was prescribed Xarelto for a CHADS2VASC score of 3. She reports that she never started Xarelto and has just been on ASA. Patient presented to the ED 12/30/21 via EMS for irregular heart beat and dizziness. She converted to SR with diltiazem at the ED.   On follow up today, patient reports that she has done well since her last visit. She stopped the daily diltiazem on her own because it made her feel "awful". She has not had any interim afib.   Today, she denies symptoms of palpitations, chest pain, shortness of breath, orthopnea, PND, lower extremity edema, dizziness, presyncope, syncope, snoring, daytime somnolence, bleeding, or neurologic sequela. The patient is tolerating medications without difficulties and is otherwise without complaint today.    Atrial Fibrillation Risk Factors:  she does not have symptoms or diagnosis of sleep apnea. she does not have a history of rheumatic fever. she does not have a history of alcohol use. The patient does not have a history of early familial atrial fibrillation or other arrhythmias.  she has a BMI of Body mass index is 27.46 kg/m.Marland Kitchen Filed Weights   04/09/22 1033  Weight: 72.6 kg    Family History  Problem Relation Age of Onset   Cancer Mother        liver   Cancer Father        bone    Cancer Maternal Uncle        colon   Coronary artery disease Neg Hx      Atrial Fibrillation Management history:  Previous antiarrhythmic drugs: none Previous cardioversions: none Previous ablations: none CHADS2VASC score: 3 Anticoagulation history: Xarelto (never started), Eliquis (side effects)   Past Medical History:  Diagnosis Date   Anxiety    Cancer ([place]) 2012   breast- left   History of breast cancer    s/p lumpectomy and HRT   Hyperlipidemia    intolerant of all cholesterol meds   Obesity    Palpitations    Past Surgical History:  Procedure Laterality Date   ABDOMINAL HYSTERECTOMY  partial   per pt she still has uterus   BREAST BIOPSY Right 2011   benign   BREAST LUMPECTOMY  07/26/2007   Left lumpectomy+sln,ER+PR-,Her2-,T1bN0   CHOLECYSTECTOMY     CP : ETT 10/11     Medical History. Walked 630 on [place] protocl. 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     Biotin 10000 MCG TABS Take 10 mcg by  mouth daily.     Cholecalciferol (VITAMIN D3) 125 MCG (5000 UT) TABS Take 5,000 Units by mouth daily.     COLLAGEN-VITAMIN C-BIOTIN PO Take by mouth.     diltiazem (CARDIZEM) 30 MG tablet Take 1 tablet every 4 hours AS NEEDED for heart rate >100 as long as top blood pressure >100. 30 tablet 3   vitamin C (ASCORBIC ACID) 500 MG tablet Take 500 mg by mouth daily.     Zinc 30 MG CAPS Take 30 mg by mouth daily.     diltiazem (CARDIZEM CD) 120 MG 24 hr capsule Take 1 capsule (120 mg total) by mouth daily. (Patient not taking: Reported on 04/09/2022) 30 capsule 3   No current facility-administered medications for this encounter.     Allergies  Allergen Reactions   Naproxen Sodium Hives   Neomycin-Bacitracin Zn-Polymyx Swelling    In ear, where it was applied.    Tetracycline Rash    All over the body   Apixaban Hives   Other Rash    ALL TAPES-RASH (PT HAS LEAST RXN TO PAPER TAPE)    Social History   Socioeconomic History   Marital status: Widowed    Spouse name: Not on file   Number of children: 3   Years of education: Not on file   Highest education level: Not on file  Occupational History   Occupation: Retired Marine scientist  Tobacco Use   Smoking status: Never   Smokeless tobacco: Never   Tobacco comments:    Never smoke 04/09/22  Substance and Sexual Activity   Alcohol use: Not Currently    Alcohol/week: 1.0 standard drink    Types: 1 Glasses of wine per week    Comment: occasional   Drug use: No   Sexual activity: Not on file  Other Topics Concern   Not on file  Social History Narrative   Not on file   Social Determinants of Health   Financial Resource Strain: Not on file  Food Insecurity: Not on file  Transportation Needs: Not on file  Physical Activity: Not on file  Stress: Not on file  Social Connections: Not on file  Intimate Partner Violence: Not on file     ROS- All systems are reviewed and negative except as per the HPI above.  Physical Exam: Vitals:   04/09/22 1033  BP: 132/86  Pulse: 62  Weight: 72.6 kg  Height: 5' 4" (1.626 m)     GEN- The patient is a well appearing elderly female, alert and oriented x 3 today.   HEENT-head normocephalic, atraumatic, sclera clear, conjunctiva pink, hearing intact, trachea midline. Lungs- Clear to ausculation bilaterally, normal work of breathing Heart- Regular rate and rhythm, no murmurs, rubs or gallops  GI- soft, NT, ND, + BS Extremities- no clubbing, cyanosis, or edema MS- no significant deformity or atrophy Skin- no rash or lesion Psych- euthymic mood, full affect Neuro- strength and sensation are intact   Wt Readings  from Last 3 Encounters:  04/09/22 72.6 kg  01/08/22 75.1 kg  12/30/21 72.6 kg    EKG today demonstrates  SR Vent. rate 62 BPM PR interval 154 ms QRS duration 86 ms QT/QTcB 436/442 ms  Echo 01/02/21 demonstrated  1. Left ventricular ejection fraction, by estimation, is 55 to 60%. The  left ventricle has normal function. The left ventricle has no regional  wall motion abnormalities. Left ventricular diastolic parameters are  consistent with Grade I diastolic dysfunction (impaired relaxation).   2. Right ventricular systolic  function is normal. The right ventricular  size is normal. There is normal pulmonary artery systolic pressure.   3. The mitral valve is normal in structure. Mild mitral valve  regurgitation.   4. The aortic valve is tricuspid. Aortic valve regurgitation is not  visualized. Mild aortic valve sclerosis is present, with no evidence of  aortic valve stenosis.   5. The inferior vena cava is normal in size with greater than 50%  respiratory variability, suggesting right atrial pressure of 3 mmHg.   Epic records are reviewed at length today  CHA2DS2-VASc Score = 3  The patient's score is based upon: CHF History: 0 HTN History: 0 Diabetes History: 0 Stroke History: 0 Vascular Disease History: 0 Age Score: 2 Gender Score: 1        ASSESSMENT AND PLAN: 1. Paroxysmal Atrial Fibrillation (ICD10:  I48.0) The patient's CHA2DS2-VASc score is 3, indicating a 3.2% annual risk of stroke.   Patient appears to be maintaining SR. She is intolerant of several medications. She is willing to continue diltiazem 30 mg PRN q 4 hours for heart racing.  Recall discussion about the risks and benefits of anticoagulation. Continues to decline anticoagulation, patient understands risk of significant CVA.  2. Secondary Hypercoagulable State (ICD10:  D68.69) The patient is at significant risk for stroke/thromboembolism based upon her CHA2DS2-VASc Score of 3.   Patient has declined  anticoagulation.     3. HLD Total cholesterol >300 at PCP per patient report.  She declines to start any lipid lowering medication.  F/u with PCP.   Follow up in the AF clinic in one year.    [facility] [address] [place], [place] [address] [phone] 04/09/2022 10:40 AM

## 2022-05-06 ENCOUNTER — Other Ambulatory Visit: Payer: Self-pay

## 2022-05-06 ENCOUNTER — Encounter (HOSPITAL_BASED_OUTPATIENT_CLINIC_OR_DEPARTMENT_OTHER): Payer: Self-pay

## 2022-05-06 ENCOUNTER — Emergency Department (HOSPITAL_BASED_OUTPATIENT_CLINIC_OR_DEPARTMENT_OTHER)
Admission: EM | Admit: 2022-05-06 | Discharge: 2022-05-06 | Disposition: A | Discharge status: Home / Self Care | Payer: PPO | Attending: Emergency Medicine | Admitting: Emergency Medicine

## 2022-05-06 ENCOUNTER — Emergency Department (HOSPITAL_BASED_OUTPATIENT_CLINIC_OR_DEPARTMENT_OTHER): Payer: PPO | Admitting: Radiology

## 2022-05-06 DIAGNOSIS — I480 Paroxysmal atrial fibrillation: Secondary | ICD-10-CM | POA: Insufficient documentation

## 2022-05-06 DIAGNOSIS — I700 Atherosclerosis of aorta: Secondary | ICD-10-CM | POA: Diagnosis not present

## 2022-05-06 DIAGNOSIS — Z7982 Long term (current) use of aspirin: Secondary | ICD-10-CM | POA: Insufficient documentation

## 2022-05-06 DIAGNOSIS — R000 Tachycardia, unspecified: Secondary | ICD-10-CM | POA: Diagnosis present

## 2022-05-06 HISTORY — DX: Unspecified atrial fibrillation: I48.91

## 2022-05-06 LAB — BASIC METABOLIC PANEL
Anion gap: 15 (ref 5–15)
BUN: 11 mg/dL (ref 8–23)
CO2: 20 mmol/L — ABNORMAL LOW (ref 22–32)
Calcium: 10.5 mg/dL — ABNORMAL HIGH (ref 8.9–10.3)
Chloride: 98 mmol/L (ref 98–111)
Creatinine, Ser: 0.81 mg/dL (ref 0.44–1.00)
GFR, Estimated: >60 mL/min (ref >60)
Glucose, Bld: 107 mg/dL — ABNORMAL HIGH (ref 70–99)
Potassium: 3.7 mmol/L (ref 3.5–5.1)
Sodium: 133 mmol/L — ABNORMAL LOW (ref 135–145)

## 2022-05-06 LAB — CBC
HCT: 45.4 % (ref 36.0–46.0)
Hemoglobin: 15.4 g/dL — ABNORMAL HIGH (ref 12.0–15.0)
MCH: 30.3 pg (ref 26.0–34.0)
MCHC: 33.9 g/dL (ref 30.0–36.0)
MCV: 89.4 fL (ref 80.0–100.0)
Platelets: 252 10*3/uL (ref 150–400)
RBC: 5.08 MIL/uL (ref 3.87–5.11)
RDW: 13.3 % (ref 11.5–15.5)
WBC: 6.6 10*3/uL (ref 4.0–10.5)
nRBC: 0 % (ref 0.0–0.2)

## 2022-05-06 NOTE — ED Triage Notes (Signed)
Onset today of rapid heart rate associated with weakness and dizziness.  Denies chest pain or SOB.  Took Diltiazem 30 mg just before coming to ED.

## 2022-05-06 NOTE — ED Notes (Signed)
RN provided AVS using Teachback Method. Patient verbalizes understanding of Discharge Instructions. Opportunity for Questioning and Answers were provided by RN. Patient Discharged from ED ambulatory to Home with Family. ? ?

## 2022-05-06 NOTE — ED Provider Notes (Signed)
[facility] Provider Note   CSN: [id number] Arrival date & time: 05/06/22  1203     History  Chief Complaint  Patient presents with   Tachycardia    [name] [name] is a 76 y.o. female.  Patient with known history of atrial fibrillation but it is quite intermittent when it occurs.  She had trouble with long-acting calcium channel blockers or beta-blockers.  So she has cardia exam 30 mg she takes every 4 hours as needed for heart rate greater than 100.  She had an onset of rapid heart rate this morning occurred around 11 she took her cardia exam and it switched back to no palpitations.  Not associated with any chest pain or shortness of breath.  Came to the waiting room and had another episode occurred in that time she felt like maybe she was going to pass out that is unusual.  By time they got her back to the bed she was back in sinus rhythm and she has felt fine since.  Patient is followed by the atrial fibrillation cardiology clinic.  Patient is just taking aspirin.  They know that she is not on Eliquis or Xarelto.  But but since her atrial fibrillation is so intermittent they have not started her on Xarelto at all times but her Vascor is 3.  Patient is completely asymptomatic at this time.  Cardiac monitor shows normal sinus rhythm.       Home Medications Prior to Admission medications   Medication Sig Start Date End Date Taking? Authorizing Provider  aspirin EC 81 MG tablet Take 81 mg by mouth daily. Swallow whole.   Yes [provider]  Cholecalciferol (VITAMIN D3) 125 MCG (5000 UT) TABS Take 5,000 Units by mouth daily.   Yes [provider]  COLLAGEN-VITAMIN C-BIOTIN PO Take by mouth.   Yes [provider]  diltiazem (CARDIZEM) 30 MG tablet Take 1 tablet every 4 hours AS NEEDED for heart rate >100 as long as top blood pressure >100. 04/09/22  Yes Fenton, Clint R, PA  Omega-3 Fatty Acids (OMEGA 3 PO) Take by mouth.   Yes [provider]  Turmeric (QC TUMERIC COMPLEX PO) Take by mouth.   Yes [provider]  acetaminophen (TYLENOL) 500 MG tablet Take 500 mg by mouth every 6 (six) hours as needed for moderate pain.    [provider]      Allergies    Naproxen sodium, Neomycin-bacitracin zn-polymyx, Tetracycline, Apixaban, Advil [ibuprofen], and Other    Review of Systems   Review of Systems  Constitutional:  Negative for chills and fever.  HENT:  Negative for ear pain and sore throat.   Eyes:  Negative for pain and visual disturbance.  Respiratory:  Negative for cough and shortness of breath.   Cardiovascular:  Positive for palpitations. Negative for chest pain.  Gastrointestinal:  Negative for abdominal pain and vomiting.  Genitourinary:  Negative for dysuria and hematuria.  Musculoskeletal:  Negative for arthralgias and back pain.  Skin:  Negative for color change and rash.  Neurological:  Positive for light-headedness. Negative for seizures and syncope.  All other systems reviewed and are negative.   Physical Exam Updated Vital Signs BP 135/75   Pulse (!) 56   Temp 97.8 F (36.6 C)   Resp 14   Ht 1.626 m ('5\' 4"'$ )   Wt 72.6 kg   SpO2 100%   BMI 27.46 kg/m  Physical Exam Vitals and nursing note reviewed.  Constitutional:  General: She is not in acute distress.    Appearance: Normal appearance. She is well-developed.  HENT:     Head: Normocephalic and atraumatic.  Eyes:     Extraocular Movements: Extraocular movements intact.     Conjunctiva/sclera: Conjunctivae normal.     Pupils: Pupils are equal, round, and reactive to light.  Cardiovascular:     Rate and Rhythm: Normal rate and regular rhythm.     Heart sounds: No murmur heard. Pulmonary:     Effort: Pulmonary effort is normal. No respiratory distress.     Breath sounds: Normal breath sounds. No wheezing, rhonchi or rales.  Abdominal:     Palpations: Abdomen is soft.     Tenderness: There is no abdominal  tenderness.  Musculoskeletal:        General: No swelling.     Cervical back: Normal range of motion and neck supple.     Right lower leg: No edema.     Left lower leg: No edema.  Skin:    General: Skin is warm and dry.     Capillary Refill: Capillary refill takes less than 2 seconds.  Neurological:     General: No focal deficit present.     Mental Status: She is alert and oriented to person, place, and time.  Psychiatric:        Mood and Affect: Mood normal.     ED Results / Procedures / Treatments   Labs (all labs ordered are listed, but only abnormal results are displayed) Labs Reviewed  BASIC METABOLIC PANEL - Abnormal; Notable for the following components:      Result Value   Sodium 133 (*)    CO2 20 (*)    Glucose, Bld 107 (*)    Calcium 10.5 (*)    All other components within normal limits  CBC - Abnormal; Notable for the following components:   Hemoglobin 15.4 (*)    All other components within normal limits    EKG EKG Interpretation  Date/Time:  Wednesday May 06 2022 12:12:10 EDT Ventricular Rate:  129 PR Interval:    QRS Duration: 78 QT Interval:  304 QTC Calculation: 445 R Axis:   49 Text Interpretation: Undetermined rhythm ST & T wave abnormality, consider inferior ischemia Abnormal ECG When compared with ECG of 09-Apr-2022 10:36, Current undetermined rhythm precludes rhythm comparison, needs review T wave inversion now evident in Inferior leads Nonspecific T wave abnormality now evident in Anterolateral leads Confirmed by Fredia Sorrow [phone]) on 05/06/2022 1:13:10 PM Review of old EKGs I think that this is consistent with atrial fibrillation.  Radiology DG Chest 2 View  Result Date: 05/06/2022 CLINICAL DATA:  76 year old female with history of atrial fibrillation presenting with increased heart rate, weakness and dizziness. EXAM: CHEST - 2 VIEW COMPARISON:  Chest x-ray 12/30/2021. FINDINGS: Lung volumes are normal. No consolidative airspace disease.  No pleural effusions. No pneumothorax. No pulmonary nodule or mass noted. Pulmonary vasculature and the cardiomediastinal silhouette are within normal limits. Atherosclerosis in the thoracic aorta. IMPRESSION: 1.  No radiographic evidence of acute cardiopulmonary disease. 2. Aortic atherosclerosis. Electronically Signed   By: Vinnie Langton M.D.   On: 05/06/2022 12:36    Procedures Procedures    Medications Ordered in ED Medications - No data to display  ED Course/ Medical Decision Making/ A&P                           Medical Decision Making Amount and/or  Complexity of Data Reviewed Labs: ordered. Radiology: ordered.   Patient now has been in sinus rhythm for a period of time.  Patient completely asymptomatic.  Patient metabolic panel significant for sodium a little down at 133 CO2 20 potassium normal very reassuring calcium up a little bit at 10.5 BUN and creatinine is normal.  CBC no leukocytosis hemoglobin 15.4.  Chest x-ray without any acute findings.  Initial EKG when she first arrived here did show evidence of atrial fibrillation although official read said undetermined rhythm.  Think was consistent with atrial fibrillation.  Patient did have some nonspecific ST and T wave changes.  But nothing significant from acute cardiac event and patient not having any chest pain at all.  Patient stable for discharge home follow-up with atrial fibrillation clinic.  Patient will return for any new or worse symptoms.  Knows how to use her regular Cardizem every 4 hours to help with the palpitations.   Final Clinical Impression(s) / ED Diagnoses Final diagnoses:  Paroxysmal atrial fibrillation [facility])    Rx / DC Orders ED Discharge Orders     None         Fredia Sorrow, MD 05/06/22 [phone]

## 2022-05-06 NOTE — Discharge Instructions (Signed)
Follow-up with the atrial fibrillation clinic cardiology as scheduled.  Continue taking the Cardizem every 4 hours as needed since you have trouble with long-acting Cardizem.  Return for heart rate racing again or for any new or worse symptoms that does not resolve after taking the cardia exam or within 40 minutes.

## 2022-05-07 ENCOUNTER — Telehealth (HOSPITAL_COMMUNITY): Payer: Self-pay

## 2022-05-07 NOTE — Telephone Encounter (Signed)
Reached out to patient to schedule ED f/u appointment. No answer left voicemail. 

## 2022-06-16 DIAGNOSIS — M1712 Unilateral primary osteoarthritis, left knee: Secondary | ICD-10-CM | POA: Diagnosis not present

## 2022-07-10 DIAGNOSIS — H2513 Age-related nuclear cataract, bilateral: Secondary | ICD-10-CM | POA: Diagnosis not present

## 2022-07-24 ENCOUNTER — Ambulatory Visit (INDEPENDENT_AMBULATORY_CARE_PROVIDER_SITE_OTHER): Payer: PPO | Admitting: Family Medicine

## 2022-07-24 ENCOUNTER — Encounter: Payer: Self-pay | Admitting: Family Medicine

## 2022-07-24 VITALS — BP 104/68 | HR 66 | Temp 97.6°F | Ht 63.75 in | Wt 165.9 lb

## 2022-07-24 DIAGNOSIS — E782 Mixed hyperlipidemia: Secondary | ICD-10-CM | POA: Diagnosis not present

## 2022-07-24 DIAGNOSIS — I480 Paroxysmal atrial fibrillation: Secondary | ICD-10-CM

## 2022-07-24 DIAGNOSIS — Z780 Asymptomatic menopausal state: Secondary | ICD-10-CM

## 2022-07-24 LAB — LIPID PANEL
Cholesterol: 299 mg/dL — ABNORMAL HIGH (ref 0–200)
HDL: 87.1 mg/dL (ref >39.00)
LDL Cholesterol: 192 mg/dL — ABNORMAL HIGH (ref 0–99)
NonHDL: 211.64
Total CHOL/HDL Ratio: 3
Triglycerides: 99 mg/dL (ref 0.0–149.0)
VLDL: 19.8 mg/dL (ref 0.0–40.0)

## 2022-07-24 NOTE — Progress Notes (Signed)
New Patient Office Visit  Subjective    Patient ID: [name] [name], female    DOB: [birth year]  Age: 76 y.o. MRN: [id number]  CC:  Chief Complaint  Patient presents with  . Establish Care    HPI [name] [name] presents to establish care Patient used to see Dr. Radene Ou at [facility] but she is no longer practicing. Patient has a history of paroxysmal atrial fibrillation and hypertension, she is only on cardizem 30 mg as needed for heart racing. She is not   Outpatient Encounter Medications as of 07/24/2022  Medication Sig  . acetaminophen (TYLENOL) 500 MG tablet Take 500 mg by mouth every 6 (six) hours as needed for moderate pain.  Marland Kitchen aspirin EC 81 MG tablet Take 81 mg by mouth daily. Swallow whole.  . COLLAGEN-VITAMIN C-BIOTIN PO Take by mouth.  . diltiazem (CARDIZEM) 30 MG tablet Take 1 tablet every 4 hours AS NEEDED for heart rate >100 as long as top blood pressure >100.  Marland Kitchen OVER THE COUNTER MEDICATION Cholesterol support-nightly  . OVER THE COUNTER MEDICATION Energize Electrolytes-1 scoop daily  . OVER THE COUNTER MEDICATION Omegas with Turmeric-daily  . VITAMIN D PO Take 4,000 Units by mouth daily.  . [DISCONTINUED] Cholecalciferol (VITAMIN D3) 125 MCG (5000 UT) TABS Take 5,000 Units by mouth daily.  . [DISCONTINUED] Omega-3 Fatty Acids (OMEGA 3 PO) Take by mouth.  . [DISCONTINUED] Turmeric (QC TUMERIC COMPLEX PO) Take by mouth.   No facility-administered encounter medications on file as of 07/24/2022.    Past Medical History:  Diagnosis Date  . Anxiety   . Arthritis   . Atrial fibrillation ([place])   . Benign colon polyp   . Cancer [facility]) 2012   breast- left  . Diverticulosis   . History of breast cancer    s/p lumpectomy and HRT  . Hyperlipidemia    intolerant of all cholesterol meds  . Obesity   . Palpitations     Past Surgical History:  Procedure Laterality Date  . ABDOMINAL HYSTERECTOMY  partial   per pt she still has uterus  . BREAST BIOPSY Right 2011   benign   . BREAST LUMPECTOMY  07/26/2007   Left lumpectomy+sln,ER+PR-,Her2-,T1bN0  . CHOLECYSTECTOMY    . CP : ETT 10/11     Medical History. Walked 630 on [address] protocl. normal ECG  . CT RADIATION THERAPY GUIDE    . KNEE ARTHROSCOPY     left 2004; right 2014  . Left Knee Score    . OOPHORECTOMY  2008  . 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 TOTAL ABDOMINAL HYSTERECTOMY W/ BILATERAL SALPINGOOPHORECTOMY      Family History  Problem Relation Age of Onset  . Cancer Mother        liver  . Cancer Father        bone  . Cancer Maternal Uncle        colon  . Coronary artery disease Neg Hx     Social History   Socioeconomic History  . Marital status: Widowed    Spouse name: Not on file  . Number of children: 3  . Years of education: Not on file  . Highest education level: Not on file  Occupational History  . Occupation: Retired Marine scientist  Tobacco Use  . Smoking status: Never  . Smokeless tobacco: Never  . Tobacco comments:    Never smoke 04/09/22  Vaping Use  . Vaping Use: Never used  Substance and Sexual Activity  .  Alcohol use: Not Currently    Alcohol/week: 1.0 standard drink of alcohol    Types: 1 Glasses of wine per week    Comment: occasional  . Drug use: No  . Sexual activity: Not Currently  Other Topics Concern  . Not on file  Social History Narrative  . Not on file   Social Determinants of Health   Financial Resource Strain: Not on file  Food Insecurity: Not on file  Transportation Needs: Not on file  Physical Activity: Not on file  Stress: Not on file  Social Connections: Not on file  Intimate Partner Violence: Not on file    ROS      Objective    BP 104/68 (BP Location: Right Arm, Patient Position: Sitting, Cuff Size: Large)   Pulse 66   Temp 97.6 F (36.4 C) (Oral)   Ht 5' 3.75" (1.619 m)   Wt 165 lb 14.4 oz (75.3 kg)   SpO2 97%   BMI 28.70 kg/m   Physical Exam Vitals reviewed.  Constitutional:      Appearance: Normal  appearance. She is well-groomed and normal weight.  HENT:     Head: Normocephalic and atraumatic.     Mouth/Throat:     Mouth: Mucous membranes are moist.     Pharynx: Oropharynx is clear.  Eyes:     Extraocular Movements: Extraocular movements intact.     Conjunctiva/sclera: Conjunctivae normal.     Pupils: Pupils are equal, round, and reactive to light.  Cardiovascular:     Rate and Rhythm: Normal rate and regular rhythm.     Pulses: Normal pulses.     Heart sounds: S1 normal and S2 normal.  Pulmonary:     Effort: Pulmonary effort is normal.     Breath sounds: Normal breath sounds and air entry.  Abdominal:     General: Abdomen is flat. Bowel sounds are normal.     Palpations: Abdomen is soft.  Musculoskeletal:        General: Normal range of motion.     Cervical back: Normal range of motion and neck supple.     Right lower leg: No edema.     Left lower leg: No edema.  Skin:    General: Skin is warm and dry.  Neurological:     Mental Status: She is alert and oriented to person, place, and time. Mental status is at baseline.     Gait: Gait is intact.  Psychiatric:        Mood and Affect: Mood and affect normal.        Speech: Speech normal.        Behavior: Behavior normal.        Judgment: Judgment normal.    {Labs (Optional):23779}    Assessment & Plan:   Problem List Items Addressed This Visit       Cardiovascular and Mediastinum   Paroxysmal atrial fibrillation (HCC) - Primary     Other   HLD (hyperlipidemia)   Relevant Orders   Lipid Panel   Other Visit Diagnoses     Postmenopausal state       Relevant Orders   DG Bone Density       No follow-ups on file.   Farrel Conners, MD

## 2022-07-28 ENCOUNTER — Other Ambulatory Visit: Payer: Self-pay | Admitting: General Surgery

## 2022-07-28 DIAGNOSIS — Z1231 Encounter for screening mammogram for malignant neoplasm of breast: Secondary | ICD-10-CM

## 2022-07-28 NOTE — Assessment & Plan Note (Signed)
She needs a lipid panel today for evaluation.

## 2022-07-28 NOTE — Assessment & Plan Note (Signed)
Under good control with cardizem 30 mg as needed. She follows regularly with cardiology, she denies any cardiac sx at this time. Will continue the current plan and medications.

## 2022-08-31 ENCOUNTER — Ambulatory Visit
Admission: RE | Admit: 2022-08-31 | Discharge: 2022-08-31 | Disposition: A | Discharge status: Home / Self Care | Payer: PPO | Source: Ambulatory Visit | Attending: General Surgery | Admitting: General Surgery

## 2022-08-31 DIAGNOSIS — Z1231 Encounter for screening mammogram for malignant neoplasm of breast: Secondary | ICD-10-CM | POA: Diagnosis not present

## 2022-09-01 ENCOUNTER — Other Ambulatory Visit: Payer: Self-pay | Admitting: General Surgery

## 2022-09-01 DIAGNOSIS — R928 Other abnormal and inconclusive findings on diagnostic imaging of breast: Secondary | ICD-10-CM

## 2022-09-10 ENCOUNTER — Other Ambulatory Visit: Payer: Self-pay | Admitting: General Surgery

## 2022-09-10 ENCOUNTER — Ambulatory Visit
Admission: RE | Admit: 2022-09-10 | Discharge: 2022-09-10 | Disposition: A | Discharge status: Home / Self Care | Payer: PPO | Source: Ambulatory Visit | Attending: General Surgery | Admitting: General Surgery

## 2022-09-10 DIAGNOSIS — R920 Mammographic microcalcification found on diagnostic imaging of breast: Secondary | ICD-10-CM | POA: Diagnosis not present

## 2022-09-10 DIAGNOSIS — R921 Mammographic calcification found on diagnostic imaging of breast: Secondary | ICD-10-CM

## 2022-09-10 DIAGNOSIS — R928 Other abnormal and inconclusive findings on diagnostic imaging of breast: Secondary | ICD-10-CM | POA: Diagnosis not present

## 2022-09-15 DIAGNOSIS — Z853 Personal history of malignant neoplasm of breast: Secondary | ICD-10-CM | POA: Diagnosis not present

## 2022-09-15 DIAGNOSIS — R921 Mammographic calcification found on diagnostic imaging of breast: Secondary | ICD-10-CM | POA: Diagnosis not present

## 2022-09-17 ENCOUNTER — Encounter: Payer: Self-pay | Admitting: Family Medicine

## 2022-09-17 ENCOUNTER — Ambulatory Visit (INDEPENDENT_AMBULATORY_CARE_PROVIDER_SITE_OTHER): Payer: PPO | Admitting: Family Medicine

## 2022-09-17 DIAGNOSIS — L259 Unspecified contact dermatitis, unspecified cause: Secondary | ICD-10-CM | POA: Diagnosis not present

## 2022-09-17 MED ORDER — TRIAMCINOLONE ACETONIDE 0.1 % EX CREA: 1.0000 | TOPICAL_CREAM | Freq: Two times a day (BID) | CUTANEOUS | 0 refills | Status: AC

## 2022-09-17 NOTE — Progress Notes (Signed)
   Established Patient Office Visit  Subjective   Patient ID: [name] [name], female    DOB: 1946-04-16  Age: 76 y.o. MRN: [id number]  Chief Complaint  Patient presents with   Rash    Patient complains of an itchy rash noted under the right eye x2 weeks, used Polysporin with no relief    Patient has a rash under her right eye, has been going on for the past 2 weeks. States this has happened before, the skin is very dry and itchy, has not used any new creams or anything, no new face washes. State she tried polysporin cream to help treat it but it didn't help.  Abnormal mammo - patient is going for biopsy of her right breast soon. We reviewed the results of her diagnostic mammo.      Review of Systems  All other systems reviewed and are negative.     Objective:     BP 132/82 (BP Location: Right Arm, Patient Position: Sitting, Cuff Size: Large)   Pulse 60   Temp 97.7 F (36.5 C) (Oral)   Ht 5' 3.75" (1.619 m)   Wt 168 lb 14.4 oz (76.6 kg)   SpO2 98%   BMI 29.22 kg/m    Physical Exam Constitutional:      Appearance: Normal appearance. She is normal weight.  Eyes:     Extraocular Movements: Extraocular movements intact.     Conjunctiva/sclera: Conjunctivae normal.  Pulmonary:     Effort: Pulmonary effort is normal.  Skin:    Findings: Rash (there is a edematous, erythematous circular area that is somewhat vesicular under the right eye, approximately 2 cm in diameter) present.  Neurological:     Mental Status: She is alert and oriented to person, place, and time. Mental status is at baseline.      No results found for any visits on 09/17/22.    The ASCVD Risk score (Arnett DK, et al., 2019) failed to calculate for the following reasons:   The patient has a prior MI or stroke diagnosis    Assessment & Plan:   Problem List Items Addressed This Visit       Musculoskeletal and Integument   Contact dermatitis    Itchy raised rash localized to under the right eye,  will rx triamcinolone cream 0.1 % BID for 7-14 days. If the rash does not improve patient was instructed to let us know. Could be a chemical irritant, no signs of shingles-like rash, conjunctiva is clear, pt denies blurry vision.      Relevant Medications   triamcinolone cream (KENALOG) 0.1 %    No follow-ups on file.    Farrel Conners, MD

## 2022-09-17 NOTE — Assessment & Plan Note (Signed)
Itchy raised rash localized to under the right eye, will rx triamcinolone cream 0.1 % BID for 7-14 days. If the rash does not improve patient was instructed to let us know. Could be a chemical irritant, no signs of shingles-like rash, conjunctiva is clear, pt denies blurry vision.

## 2022-09-24 ENCOUNTER — Ambulatory Visit
Admission: RE | Admit: 2022-09-24 | Discharge: 2022-09-24 | Disposition: A | Discharge status: Home / Self Care | Payer: PPO | Source: Ambulatory Visit | Attending: General Surgery | Admitting: General Surgery

## 2022-09-24 DIAGNOSIS — R921 Mammographic calcification found on diagnostic imaging of breast: Secondary | ICD-10-CM

## 2022-09-24 DIAGNOSIS — D0511 Intraductal carcinoma in situ of right breast: Secondary | ICD-10-CM | POA: Diagnosis not present

## 2022-10-01 ENCOUNTER — Ambulatory Visit: Payer: Self-pay | Admitting: General Surgery

## 2022-10-01 DIAGNOSIS — Z853 Personal history of malignant neoplasm of breast: Secondary | ICD-10-CM | POA: Diagnosis not present

## 2022-10-01 DIAGNOSIS — D0511 Intraductal carcinoma in situ of right breast: Secondary | ICD-10-CM | POA: Diagnosis not present

## 2022-10-02 ENCOUNTER — Telehealth: Payer: Self-pay | Admitting: Family Medicine

## 2022-10-02 ENCOUNTER — Other Ambulatory Visit: Payer: Self-pay | Admitting: General Surgery

## 2022-10-02 DIAGNOSIS — D0511 Intraductal carcinoma in situ of right breast: Secondary | ICD-10-CM

## 2022-10-02 NOTE — Telephone Encounter (Signed)
Left message for patient to call back and schedule Medicare Annual Wellness Visit (AWV) either virtually or in office. Left  my [name] [name] number [phone]   awvi 01/22/11 per [facility]   please schedule with Nurse Health Adviser   45 min for awv-i and in office appointments 30 min for awv-s  phone/virtual appointments

## 2022-10-08 ENCOUNTER — Telehealth: Payer: Self-pay | Admitting: Hematology and Oncology

## 2022-10-08 ENCOUNTER — Other Ambulatory Visit: Payer: Self-pay | Admitting: *Deleted

## 2022-10-08 DIAGNOSIS — D0511 Intraductal carcinoma in situ of right breast: Secondary | ICD-10-CM

## 2022-10-08 NOTE — Telephone Encounter (Signed)
Spoke with patient confirming new patient appointment 12/4

## 2022-10-14 ENCOUNTER — Other Ambulatory Visit: Payer: Self-pay

## 2022-10-14 ENCOUNTER — Encounter (HOSPITAL_BASED_OUTPATIENT_CLINIC_OR_DEPARTMENT_OTHER): Payer: Self-pay | Admitting: General Surgery

## 2022-10-14 NOTE — Progress Notes (Signed)
   10/14/22 1324  PAT Phone Screen  Is the patient taking a GLP-1 receptor agonist? No  Do You Have Diabetes? No  Do You Have Hypertension? No  Have You Ever Been to the ER for Asthma? No  Have You Taken Oral Steroids in the Past 3 Months? No  Do you Take Phenteramine or any Other Diet Drugs? No  Recent  Lab Work, EKG, CXR? Yes  Do you have a history of heart problems? Yes  Cardiologist Name afib clinic  Have you ever had tests on your heart? Yes  What cardiac tests were performed? Echo  What date/year were cardiac tests completed? 12/2020 ef 55-60%  Results viewable: [facility] Media Tab  Any Recent Hospitalizations? No  Height '5\' 4"'$  (1.626 m)  Weight 72.6 kg  Pat Appointment Scheduled No  Reason for No Appointment Not Needed

## 2022-10-14 NOTE — Progress Notes (Signed)
Chart reviewed with Dr. Lissa Hoard, patient needs to be seen with Cards and clear before surgery due to history of afib. Lelon Huh at [place] office and made aware.

## 2022-10-17 NOTE — Progress Notes (Signed)
Cardiology Office Note  Date:  10/19/2022   ID:  Marnee, Sherrard 29-Jul-1946, MRN [id number]  PCP:  Farrel Conners, MD   Chief Complaint  Patient presents with   New Patient (Initial Visit)    Establish care for A-fib. Patient needs a cardiac clearance for RIGHT BREAST LUMPECTOMY WITH RADIOACTIVE SEED LOCALIZATION. Medications reviewed by the patient verbally.     HPI:  [name] [name] is a 77 y.o. female with a history of  HLD, total cholesterol  300 breat cancer,  anxiety  Previously followed at A-fib clinic in [place] Who presents to establish care in the [place] office for her atrial fibrillation, presents for preoperative evaluation  Prior cardiac history as detailed below presented to the ED 12/12/20 with heart racing and dizziness. felt like she was "coming down with something."  EMS was called and patient was in afib with RVR, given adenosine.  She converted to SR before arrival at the ED.   positive for COVID-19 at the ED.  prescribed Xarelto for a CHADS2VASC score of 3.  She reports that she never started Xarelto and has just been on ASA.   presented to the ED 12/30/21 via EMS for irregular heart beat and dizziness.  She converted to SR with diltiazem at the ED.   Sept 2023 in [place] , woke with atrial fibrillation   stopped the daily diltiazem on her own because it made her feel "awful".   On prior visit with A-fib clinic May 2023, she declined anticoagulation  Scheduled for lumpectomy on Friday Dr. Autumn Messing  In general reports that she feels well, denies shortness of breath, no leg swelling No chest pain, 20 for angina  Total cholesterol 300  EKG personally reviewed by myself on todays visit Normal sinus rhythm rate 66 bpm no significant ST-T wave changes  PMH:   has a past medical history of Anxiety, Arthritis, Atrial fibrillation ([place]), Benign colon polyp, Cancer ([place]) (7017), Complication of anesthesia, Diverticulosis, Dysrhythmia, History of  breast cancer, Hyperlipidemia, Obesity, Palpitations, and PONV (postoperative nausea and vomiting).  PSH:    Past Surgical History:  Procedure Laterality Date   ABDOMINAL HYSTERECTOMY  partial   per pt she still has uterus   BREAST BIOPSY Right 2011   benign   BREAST LUMPECTOMY  07/26/2007   Left lumpectomy+sln,ER+PR-,Her2-,T1bN0   CHOLECYSTECTOMY     CP : ETT 10/11     Medical History. Walked 630 on [place] protocl. normal ECG   CT RADIATION THERAPY GUIDE     KNEE ARTHROSCOPY     left 2004; right 2014   Left Knee Score     OOPHORECTOMY  2008   scalp surgery  2012   cylindromas- trichoepethliomas   TOTAL ABDOMINAL HYSTERECTOMY W/ BILATERAL SALPINGOOPHORECTOMY      Current Outpatient Medications  Medication Sig Dispense Refill   acetaminophen (TYLENOL) 500 MG tablet Take 500 mg by mouth every 6 (six) hours as needed for moderate pain.     aspirin EC 81 MG tablet Take 81 mg by mouth daily. Swallow whole.     COLLAGEN-VITAMIN C-BIOTIN PO Take by mouth.     diltiazem (CARDIZEM) 30 MG tablet Take 1 tablet every 4 hours AS NEEDED for heart rate >100 as long as top blood pressure >100. 30 tablet 3   OVER THE COUNTER MEDICATION Cholesterol support-nightly     OVER THE COUNTER MEDICATION Energize Electrolytes-1 scoop daily     OVER THE COUNTER MEDICATION Omegas with Turmeric-daily  Vitamin D-Vitamin K (VITAMIN K2-VITAMIN D3 PO) Take by mouth daily.     No current facility-administered medications for this visit.    Allergies:   Naproxen sodium, Neomycin-bacitracin zn-polymyx, Tetracycline, Apixaban, Advil [ibuprofen], and Other   Social History:  The patient  reports that she has never smoked. She has never used smokeless tobacco. She reports that she does not currently use alcohol after a past usage of about 1.0 standard drink of alcohol per week. She reports that she does not use drugs.   Family History:   family history includes Cancer in her father, maternal uncle, and mother.     Review of Systems: Review of Systems  Constitutional: Negative.   HENT: Negative.    Respiratory: Negative.    Cardiovascular: Negative.   Gastrointestinal: Negative.   Musculoskeletal: Negative.   Neurological: Negative.   Psychiatric/Behavioral: Negative.    All other systems reviewed and are negative.   PHYSICAL EXAM: VS:  BP (!) 148/70 (BP Location: Right Arm, Patient Position: Sitting, Cuff Size: Normal)   Pulse 66   Ht _0  (1.626 m)   Wt 167 lb (75.8 kg)   SpO2 98%   BMI 28.67 kg/m  , BMI Body mass index is 28.67 kg/m. GEN: Well nourished, well developed, in no acute distress HEENT: normal Neck: no JVD, carotid bruits, or masses Cardiac: RRR; no murmurs, rubs, or gallops,no edema  Respiratory:  clear to auscultation bilaterally, normal work of breathing GI: soft, nontender, nondistended, + BS MS: no deformity or atrophy Skin: warm and dry, no rash Neuro:  Strength and sensation are intact Psych: euthymic mood, full affect    Recent Labs: 05/06/2022: BUN 11; Creatinine, Ser 0.81; Hemoglobin 15.4; Platelets 252; Potassium 3.7; Sodium 133    Lipid Panel Lab Results  Component Value Date   CHOL 299 (H) 07/24/2022   HDL 87.10 07/24/2022   LDLCALC 192 (H) 07/24/2022   TRIG 99.0 07/24/2022      Wt Readings from Last 3 Encounters:  10/19/22 167 lb (75.8 kg)  09/17/22 168 lb 14.4 oz (76.6 kg)  07/24/22 165 lb 14.4 oz (75.3 kg)      ASSESSMENT AND PLAN:  Problem List Items Addressed This Visit       Cardiology Problems   HLD (hyperlipidemia)   Paroxysmal atrial fibrillation (HCC) - Primary     Other   PALPITATIONS   Shortness of breath   Preop cardiovascular evaluation Scheduled for lumpectomy later this week Acceptable risk for surgery, no further cardiac testing needed Recommend she take diltiazem 30 mg pill on way to the hospital holding area presurgery in effort to minimize risk of A-fib Would recommend moderating IV fluids  perioperatively  Paroxysmal atrial fibrillation At least 3-4 episodes over the past 2 years Takes short acting diltiazem as needed for breakthrough episodes Prefers not to be on anticoagulation Long discussion with her concerning risk and benefit of anticoagulation. [place] VAsc2 Recommend she use her Apple Watch to track A-fib episodes and call us if she has increasing frequency and/or duration   Hyperlipidemia Cholesterol 300 Prefers not to be on cholesterol medication Discussed screening studies such as CT coronary calcium scoring, information provided, recommend she call us if she would like to have this done  Elevated blood pressure without diagnosis of hypertension Recommend she closely monitor blood pressure at home Call our office if numbers run high   Total encounter time more than 60 minutes  Greater than 50% was spent in counseling and coordination of care  with the patient    Signed, Esmond Plants, M.D., Ph.D. [place], [place]

## 2022-10-19 ENCOUNTER — Telehealth: Payer: Self-pay | Admitting: Cardiovascular Disease

## 2022-10-19 ENCOUNTER — Encounter: Payer: Self-pay | Admitting: Cardiovascular Disease

## 2022-10-19 ENCOUNTER — Ambulatory Visit: Payer: PPO | Attending: Cardiovascular Disease | Admitting: Cardiovascular Disease

## 2022-10-19 VITALS — BP 148/70 | HR 66 | Ht 64.0 in | Wt 167.0 lb

## 2022-10-19 DIAGNOSIS — R0602 Shortness of breath: Secondary | ICD-10-CM | POA: Diagnosis not present

## 2022-10-19 DIAGNOSIS — I480 Paroxysmal atrial fibrillation: Secondary | ICD-10-CM | POA: Diagnosis not present

## 2022-10-19 DIAGNOSIS — R002 Palpitations: Secondary | ICD-10-CM | POA: Diagnosis not present

## 2022-10-19 DIAGNOSIS — E782 Mixed hyperlipidemia: Secondary | ICD-10-CM | POA: Diagnosis not present

## 2022-10-19 NOTE — Patient Instructions (Addendum)
Medication Instructions:  No changes  If you need a refill on your cardiac medications before your next appointment, please call your pharmacy.    Lab work: No new labs needed   Testing/Procedures: No new testing needed  Think about CT coronary calcium score Call if you would like to order, $99   Follow-Up: At [facility], you and your health needs are our priority.  As part of our continuing mission to provide you with exceptional heart care, we have created designated Provider Care Teams.  These Care Teams include your primary Cardiologist (physician) and Advanced Practice Providers (APPs -  Physician Assistants and Nurse Practitioners) who all work together to provide you with the care you need, when you need it.  You will need a follow up appointment in 6 months  Providers on your designated Care Team:   Murray Hodgkins, NP Christell Faith, PA-C Cadence Kathlen Mody, [place]  COVID-19 Vaccine Information can be found at: [email or web address] For questions related to vaccine distribution or appointments, please email [email or web address] or call [phone].    Coronary Calcium Scan A coronary calcium scan is an imaging test used to look for deposits of plaque in the inner lining of the blood vessels of the heart (coronary arteries). Plaque is made up of calcium, protein, and fatty substances. These deposits of plaque can partly clog and narrow the coronary arteries without producing any symptoms or warning signs. This puts a person at risk for a heart attack. A coronary calcium scan is performed using a computed tomography (CT) scanner machine without using a dye (contrast). This test is recommended for people who are at moderate risk for heart disease. The test can find plaque deposits before symptoms develop. Tell a health care provider about: Any allergies you have. All medicines you are taking, including vitamins, herbs, eye  drops, creams, and over-the-counter medicines. Any problems you or family members have had with anesthetic medicines. Any bleeding problems you have. Any surgeries you have had. Any medical conditions you have. Whether you are pregnant or may be pregnant. What are the risks? Generally, this is a safe procedure. However, problems may occur, including: Harm to a pregnant woman and her unborn baby. This test involves the use of radiation. Radiation exposure can be dangerous to a pregnant woman and her unborn baby. If you are pregnant or think you may be pregnant, you should not have this procedure done. A slight increase in the risk of cancer. This is because of the radiation involved in the test. The amount of radiation from one test is similar to the amount of radiation you are naturally exposed to over one year. What happens before the procedure? Ask your health care provider for any specific instructions on how to prepare for this procedure. You may be asked to avoid products that contain caffeine, tobacco, or nicotine for 4 hours before the procedure. What happens during the procedure?  You will undress and remove any jewelry from your neck or chest. You may need to remove hearing aides and dentures. Women may need to remove their bras. You will put on a hospital gown. Sticky electrodes will be placed on your chest. The electrodes will be connected to an electrocardiogram (ECG) machine to record a tracing of the electrical activity of your heart. You will lie down on your back on a curved bed that is attached to the [place]. You may be given medicine to slow down your heart rate so that clear pictures can be created. You will  be moved into the CT scanner, and the CT scanner will take pictures of your heart. During this time, you will be asked to lie still and hold your breath for 10-20 seconds at a time while each picture of your heart is being taken. The procedure may vary among health care  providers and hospitals. What can I expect after the procedure? You can return to your normal activities. It is up to you to get the results of your procedure. Ask your health care provider, or the department that is doing the procedure, when your results will be ready. Summary A coronary calcium scan is an imaging test used to look for deposits of plaque in the inner lining of the blood vessels of the heart. Plaque is made up of calcium, protein, and fatty substances. A coronary calcium scan is performed using a CT scanner machine without contrast. Generally, this is a safe procedure. Tell your health care provider if you are pregnant or may be pregnant. Ask your health care provider for any specific instructions on how to prepare for this procedure. You can return to your normal activities after the scan is done. This information is not intended to replace advice given to you by your health care provider. Make sure you discuss any questions you have with your health care provider. Document Revised: 10/19/2021 Document Reviewed: 10/19/2021 Elsevier Patient Education  [place].

## 2022-10-19 NOTE — Telephone Encounter (Signed)
Pt c/o medication issue:  1. Name of Medication:  aspirin EC 81 MG tablet  2. How are you currently taking this medication (dosage and times per day)?   3. Are you having a reaction (difficulty breathing--STAT)?   4. What is your medication issue? Pt is requesting call back in regards to this medication. States she saw Dr. Rockey Situ today, but did not get clarification on if she should stop this medication before her upcoming procedure. Requesting call back.

## 2022-10-19 NOTE — Telephone Encounter (Signed)
The patient was calling to see if she should hold her 81 mg aspirin before the procedure on 12/1

## 2022-10-20 NOTE — Telephone Encounter (Signed)
Pt updated with MD's recommendations and verbalized understanding. However, pt voiced she would like to continue taking ASA.    Minna Merritts, MD  [facility] Bandy hour ago (1:21 PM)    Typically do not need to hold aspirin prior to lumpectomy May not need to be on aspirin in general given no known coronary disease, no prior stenting, Aspirin will not provide significant risk reduction for strokes in the setting of paroxysmal atrial fibrillation Thx TGollan

## 2022-10-20 NOTE — Progress Notes (Signed)

## 2022-10-22 ENCOUNTER — Ambulatory Visit
Admission: RE | Admit: 2022-10-22 | Discharge: 2022-10-22 | Disposition: A | Discharge status: Home / Self Care | Payer: PPO | Source: Ambulatory Visit | Attending: General Surgery | Admitting: General Surgery

## 2022-10-22 DIAGNOSIS — D0511 Intraductal carcinoma in situ of right breast: Secondary | ICD-10-CM | POA: Diagnosis not present

## 2022-10-22 HISTORY — PX: BREAST BIOPSY: SHX20

## 2022-10-22 NOTE — Progress Notes (Signed)
Reviewed cardiac notes with Dr Valma Cava who said OK to proceed with scheduled surgery.

## 2022-10-23 ENCOUNTER — Ambulatory Visit (HOSPITAL_BASED_OUTPATIENT_CLINIC_OR_DEPARTMENT_OTHER): Payer: PPO | Admitting: Anesthesiology

## 2022-10-23 ENCOUNTER — Ambulatory Visit (HOSPITAL_BASED_OUTPATIENT_CLINIC_OR_DEPARTMENT_OTHER)
Admission: RE | Admit: 2022-10-23 | Discharge: 2022-10-23 | Disposition: A | Discharge status: Home / Self Care | Payer: PPO | Attending: General Surgery | Admitting: General Surgery

## 2022-10-23 ENCOUNTER — Encounter (HOSPITAL_BASED_OUTPATIENT_CLINIC_OR_DEPARTMENT_OTHER): Payer: Self-pay | Admitting: General Surgery

## 2022-10-23 ENCOUNTER — Ambulatory Visit
Admission: RE | Admit: 2022-10-23 | Discharge: 2022-10-23 | Disposition: A | Discharge status: Home / Self Care | Payer: PPO | Source: Ambulatory Visit | Attending: General Surgery | Admitting: General Surgery

## 2022-10-23 ENCOUNTER — Other Ambulatory Visit: Payer: Self-pay

## 2022-10-23 ENCOUNTER — Encounter (HOSPITAL_BASED_OUTPATIENT_CLINIC_OR_DEPARTMENT_OTHER)
Admission: RE | Disposition: A | Discharge status: Home / Self Care | Payer: Self-pay | Source: Home / Self Care | Attending: General Surgery

## 2022-10-23 DIAGNOSIS — N6031 Fibrosclerosis of right breast: Secondary | ICD-10-CM | POA: Diagnosis not present

## 2022-10-23 DIAGNOSIS — I4891 Unspecified atrial fibrillation: Secondary | ICD-10-CM

## 2022-10-23 DIAGNOSIS — M1990 Unspecified osteoarthritis, unspecified site: Secondary | ICD-10-CM

## 2022-10-23 DIAGNOSIS — Z800 Family history of malignant neoplasm of digestive organs: Secondary | ICD-10-CM | POA: Diagnosis not present

## 2022-10-23 DIAGNOSIS — E785 Hyperlipidemia, unspecified: Secondary | ICD-10-CM | POA: Diagnosis not present

## 2022-10-23 DIAGNOSIS — D0511 Intraductal carcinoma in situ of right breast: Secondary | ICD-10-CM

## 2022-10-23 DIAGNOSIS — N6011 Diffuse cystic mastopathy of right breast: Secondary | ICD-10-CM | POA: Insufficient documentation

## 2022-10-23 DIAGNOSIS — Z01818 Encounter for other preprocedural examination: Secondary | ICD-10-CM

## 2022-10-23 DIAGNOSIS — F419 Anxiety disorder, unspecified: Secondary | ICD-10-CM | POA: Diagnosis not present

## 2022-10-23 DIAGNOSIS — Z170 Estrogen receptor positive status [ER+]: Secondary | ICD-10-CM | POA: Diagnosis not present

## 2022-10-23 DIAGNOSIS — R928 Other abnormal and inconclusive findings on diagnostic imaging of breast: Secondary | ICD-10-CM | POA: Diagnosis not present

## 2022-10-23 HISTORY — DX: Nausea with vomiting, unspecified: Z98.890

## 2022-10-23 HISTORY — DX: Other complications of anesthesia, initial encounter: T88.59XA

## 2022-10-23 HISTORY — PX: BREAST LUMPECTOMY WITH RADIOACTIVE SEED LOCALIZATION: SHX6424

## 2022-10-23 HISTORY — DX: Cardiac arrhythmia, unspecified: I49.9

## 2022-10-23 HISTORY — DX: Nausea with vomiting, unspecified: R11.2

## 2022-10-23 SURGERY — BREAST LUMPECTOMY WITH RADIOACTIVE SEED LOCALIZATION
Anesthesia: General | Site: Breast | Laterality: Right

## 2022-10-23 MED ORDER — EPHEDRINE SULFATE (PRESSORS) 50 MG/ML IJ SOLN
INTRAMUSCULAR | Status: DC | PRN
  Administered 2022-10-23 (×2): 10 mg via INTRAVENOUS

## 2022-10-23 MED ORDER — FENTANYL CITRATE (PF) 100 MCG/2ML IJ SOLN
INTRAMUSCULAR | Status: AC
  Filled 2022-10-23: qty 2

## 2022-10-23 MED ORDER — CEFAZOLIN SODIUM-DEXTROSE 2-4 GM/100ML-% IV SOLN
2.0000 g | INTRAVENOUS | Status: AC
  Administered 2022-10-23: 2 g via INTRAVENOUS

## 2022-10-23 MED ORDER — GABAPENTIN 100 MG PO CAPS
100.0000 mg | ORAL_CAPSULE | ORAL | Status: AC
  Administered 2022-10-23: 100 mg via ORAL

## 2022-10-23 MED ORDER — EPHEDRINE 5 MG/ML INJ
INTRAVENOUS | Status: AC
  Filled 2022-10-23: qty 5

## 2022-10-23 MED ORDER — GABAPENTIN 100 MG PO CAPS
ORAL_CAPSULE | ORAL | Status: AC
  Filled 2022-10-23: qty 1

## 2022-10-23 MED ORDER — LIDOCAINE HCL (CARDIAC) PF 100 MG/5ML IV SOSY
PREFILLED_SYRINGE | INTRAVENOUS | Status: DC | PRN
  Administered 2022-10-23: 60 mg via INTRAVENOUS

## 2022-10-23 MED ORDER — CHLORHEXIDINE GLUCONATE CLOTH 2 % EX PADS: 6.0000 | MEDICATED_PAD | Freq: Once | CUTANEOUS | Status: DC

## 2022-10-23 MED ORDER — ACETAMINOPHEN 500 MG PO TABS
1000.0000 mg | ORAL_TABLET | Freq: Once | ORAL | Status: AC
  Administered 2022-10-23: 1000 mg via ORAL

## 2022-10-23 MED ORDER — PROPOFOL 500 MG/50ML IV EMUL
INTRAVENOUS | Status: DC | PRN
  Administered 2022-10-23: 150 ug/kg/min via INTRAVENOUS

## 2022-10-23 MED ORDER — BUPIVACAINE-EPINEPHRINE (PF) 0.25% -1:200000 IJ SOLN
INTRAMUSCULAR | Status: DC | PRN
  Administered 2022-10-23: 20 mL

## 2022-10-23 MED ORDER — DEXAMETHASONE SODIUM PHOSPHATE 4 MG/ML IJ SOLN
INTRAMUSCULAR | Status: DC | PRN
  Administered 2022-10-23: 5 mg via INTRAVENOUS

## 2022-10-23 MED ORDER — OXYCODONE HCL 5 MG PO TABS: 5.0000 mg | ORAL_TABLET | Freq: Four times a day (QID) | ORAL | 0 refills | Status: DC | PRN

## 2022-10-23 MED ORDER — FENTANYL CITRATE (PF) 100 MCG/2ML IJ SOLN
INTRAMUSCULAR | Status: DC | PRN
  Administered 2022-10-23 (×2): 50 ug via INTRAVENOUS

## 2022-10-23 MED ORDER — ONDANSETRON HCL 4 MG/2ML IJ SOLN: 4.0000 mg | Freq: Once | INTRAMUSCULAR | Status: DC | PRN

## 2022-10-23 MED ORDER — LIDOCAINE 2% (20 MG/ML) 5 ML SYRINGE
INTRAMUSCULAR | Status: AC
  Filled 2022-10-23: qty 5

## 2022-10-23 MED ORDER — ONDANSETRON HCL 4 MG/2ML IJ SOLN
INTRAMUSCULAR | Status: DC | PRN
  Administered 2022-10-23: 4 mg via INTRAVENOUS

## 2022-10-23 MED ORDER — CEFAZOLIN SODIUM-DEXTROSE 2-4 GM/100ML-% IV SOLN
INTRAVENOUS | Status: AC
  Filled 2022-10-23: qty 100

## 2022-10-23 MED ORDER — ONDANSETRON HCL 4 MG/2ML IJ SOLN
INTRAMUSCULAR | Status: AC
  Filled 2022-10-23: qty 2

## 2022-10-23 MED ORDER — OXYCODONE HCL 5 MG/5ML PO SOLN: 5.0000 mg | Freq: Once | ORAL | Status: AC | PRN

## 2022-10-23 MED ORDER — ACETAMINOPHEN 500 MG PO TABS
ORAL_TABLET | ORAL | Status: AC
  Filled 2022-10-23: qty 2

## 2022-10-23 MED ORDER — MIDAZOLAM HCL 2 MG/2ML IJ SOLN
INTRAMUSCULAR | Status: AC
  Filled 2022-10-23: qty 2

## 2022-10-23 MED ORDER — LACTATED RINGERS IV SOLN: INTRAVENOUS | Status: DC

## 2022-10-23 MED ORDER — PROPOFOL 10 MG/ML IV BOLUS
INTRAVENOUS | Status: DC | PRN
  Administered 2022-10-23: 20 mg via INTRAVENOUS
  Administered 2022-10-23: 100 mg via INTRAVENOUS
  Administered 2022-10-23: 50 mg via INTRAVENOUS
  Administered 2022-10-23: 20 mg via INTRAVENOUS
  Administered 2022-10-23 (×3): 50 mg via INTRAVENOUS

## 2022-10-23 MED ORDER — FENTANYL CITRATE (PF) 100 MCG/2ML IJ SOLN
25.0000 ug | INTRAMUSCULAR | Status: DC | PRN
  Administered 2022-10-23 (×2): 50 ug via INTRAVENOUS

## 2022-10-23 MED ORDER — OXYCODONE HCL 5 MG PO TABS
ORAL_TABLET | ORAL | Status: AC
  Filled 2022-10-23: qty 1

## 2022-10-23 MED ORDER — ACETAMINOPHEN 500 MG PO TABS: 1000.0000 mg | ORAL_TABLET | ORAL | Status: DC

## 2022-10-23 MED ORDER — OXYCODONE HCL 5 MG PO TABS
5.0000 mg | ORAL_TABLET | Freq: Once | ORAL | Status: AC | PRN
  Administered 2022-10-23: 5 mg via ORAL

## 2022-10-23 SURGICAL SUPPLY — 41 items
ADH SKN CLS APL DERMABOND .7 (GAUZE/BANDAGES/DRESSINGS) ×1
APL PRP STRL LF DISP 70% ISPRP (MISCELLANEOUS) ×1
APPLIER CLIP 9.375 MED OPEN (MISCELLANEOUS)
APR CLP MED 9.3 20 MLT OPN (MISCELLANEOUS)
BLADE SURG 15 STRL LF DISP TIS (BLADE) ×1 IMPLANT
BLADE SURG 15 STRL SS (BLADE) ×1
CANISTER SUC SOCK COL 7IN (MISCELLANEOUS) ×1 IMPLANT
CANISTER SUCT 1200ML W/VALVE (MISCELLANEOUS) ×1 IMPLANT
CHLORAPREP W/TINT 26 (MISCELLANEOUS) ×1 IMPLANT
CLIP APPLIE 9.375 MED OPEN (MISCELLANEOUS) IMPLANT
COVER BACK TABLE 60X90IN (DRAPES) ×1 IMPLANT
COVER MAYO STAND STRL (DRAPES) ×1 IMPLANT
COVER PROBE CYLINDRICAL 5X96 (MISCELLANEOUS) ×1 IMPLANT
DERMABOND ADVANCED .7 DNX12 (GAUZE/BANDAGES/DRESSINGS) ×1 IMPLANT
DRAPE LAPAROSCOPIC ABDOMINAL (DRAPES) ×1 IMPLANT
DRAPE UTILITY XL STRL (DRAPES) ×1 IMPLANT
ELECT COATED BLADE 2.86 ST (ELECTRODE) ×1 IMPLANT
ELECT REM PT RETURN 9FT ADLT (ELECTROSURGICAL) ×1
ELECTRODE REM PT RTRN 9FT ADLT (ELECTROSURGICAL) ×1 IMPLANT
GLOVE BIO SURGEON STRL SZ7.5 (GLOVE) ×2 IMPLANT
GOWN STRL REUS W/ TWL LRG LVL3 (GOWN DISPOSABLE) ×2 IMPLANT
GOWN STRL REUS W/TWL LRG LVL3 (GOWN DISPOSABLE) ×2
ILLUMINATOR WAVEGUIDE N/F (MISCELLANEOUS) IMPLANT
KIT MARKER MARGIN INK (KITS) ×1 IMPLANT
LIGHT WAVEGUIDE WIDE FLAT (MISCELLANEOUS) IMPLANT
NDL HYPO 25X1 1.5 SAFETY (NEEDLE) IMPLANT
NEEDLE HYPO 25X1 1.5 SAFETY (NEEDLE) IMPLANT
NS IRRIG 1000ML POUR BTL (IV SOLUTION) IMPLANT
PACK BASIN DAY SURGERY FS (CUSTOM PROCEDURE TRAY) ×1 IMPLANT
PENCIL SMOKE EVACUATOR (MISCELLANEOUS) ×1 IMPLANT
SLEEVE SCD COMPRESS KNEE MED (STOCKING) ×1 IMPLANT
SPIKE FLUID TRANSFER (MISCELLANEOUS) IMPLANT
SPONGE T-LAP 18X18 ~~LOC~~+RFID (SPONGE) ×1 IMPLANT
SUT MON AB 4-0 PC3 18 (SUTURE) ×1 IMPLANT
SUT SILK 2 0 SH (SUTURE) IMPLANT
SUT VICRYL 3-0 CR8 SH (SUTURE) ×1 IMPLANT
SYR CONTROL 10ML LL (SYRINGE) IMPLANT
TOWEL GREEN STERILE FF (TOWEL DISPOSABLE) ×1 IMPLANT
TRAY FAXITRON CT DISP (TRAY / TRAY PROCEDURE) ×1 IMPLANT
TUBE CONNECTING 20X1/4 (TUBING) ×1 IMPLANT
YANKAUER SUCT BULB TIP NO VENT (SUCTIONS) IMPLANT

## 2022-10-23 NOTE — Anesthesia Postprocedure Evaluation (Signed)
Anesthesia Post Note  Patient: [name] [name]  Procedure(s) Performed: RIGHT BREAST LUMPECTOMY WITH RADIOACTIVE SEED LOCALIZATION (Right: Breast)     Patient location during evaluation: PACU Anesthesia Type: General Level of consciousness: awake and alert Pain management: pain level controlled Vital Signs Assessment: post-procedure vital signs reviewed and stable Respiratory status: spontaneous breathing, nonlabored ventilation and respiratory function stable Cardiovascular status: stable and blood pressure returned to baseline Anesthetic complications: no   No notable events documented.  Last Vitals:  Vitals:   10/23/22 1645 10/23/22 1702  BP: 125/64 127/68  Pulse: 71 64  Resp: 10 16  Temp:  36.4 C  SpO2: 90% 97%    Last Pain:  Vitals:   10/23/22 1702  TempSrc: Oral  PainSc: [place] Jiovanna Frei

## 2022-10-23 NOTE — Op Note (Signed)
10/23/2022  3:55 PM  PATIENT:  [name] [name]  76 y.o. female  PRE-OPERATIVE DIAGNOSIS:  RIGHT BREAST DUCTAL CARCINOMAL IN SITU  POST-OPERATIVE DIAGNOSIS:  RIGHT BREAST DUCTAL CARCINOMAL IN SITU  PROCEDURE:  Procedure(s): RIGHT BREAST LUMPECTOMY WITH RADIOACTIVE SEED LOCALIZATION (Right)  SURGEON:  Surgeon(s) and Role:    * Jovita Kussmaul, MD - Primary  PHYSICIAN ASSISTANT:   ASSISTANTS: none   ANESTHESIA:   local and general  EBL:  5 mL   BLOOD ADMINISTERED:none  DRAINS: none   LOCAL MEDICATIONS USED:  MARCAINE     SPECIMEN:  Source of Specimen:  right breast tissue with additional medial margin  DISPOSITION OF SPECIMEN:  PATHOLOGY  COUNTS:  YES  TOURNIQUET:  * No tourniquets in log *  DICTATION: .Dragon Dictation  After informed consent was obtained the patient was brought to the operating room and placed in the supine position on the operating table.  After adequate induction of general anesthesia the patient's right breast was prepped with ChloraPrep, allowed to dry, and draped in usual sterile manner.  An appropriate timeout was performed.  Previously an I-125 seed was placed in the inner aspect of the right breast to mark an area of ductal carcinoma in situ.  The neoprobe was set to I-125 in the area of radioactivity was readily identified.  The area around this was infiltrated with quarter percent Marcaine.  A curvilinear incision was made along the inner aspect of the areola of the right breast with a 15 blade knife.  The incision was carried through the skin and subcutaneous tissue sharply with the electrocautery.  Laterally the dissection was then carried underneath the nipple and areola for short distance.  Dissection was then carried medially between the breast tissue and the subcutaneous fat and skin.  Once this dissection was well beyond the area of the radioactivity then I removed the circular portion of breast tissue sharply with the electrocautery around the  radioactive seed while checking the area of radioactivity frequently.  Once the tissue was removed it was then oriented with the appropriate paint colors.  A specimen radiograph was obtained that showed the clip and seed to be near the center of the specimen.  The specimen was then sent to pathology for further evaluation.  I did elect to take an additional medial margin given that the calcifications extended about 2 cm medial to the seed.  This was also marked appropriately.  All the tissue was sent to pathology for further evaluation.  Hemostasis was achieved using the Bovie electrocautery.  The wound was irrigated with saline and infiltrated with more quarter percent Marcaine.  The cavity was marked with clips.  The deep layer of the incision was closed with layers of interrupted 3-0 Vicryl stitches.  The skin was then closed with interrupted 4-0 Monocryl subcuticular stitches.  Dermabond dressings were applied.  The patient tolerated the procedure well.  At the end of the case all needle sponge and instrument counts were correct.  The patient was then awakened and taken to recovery in stable condition.  PLAN OF CARE: Discharge to home after PACU  PATIENT DISPOSITION:  PACU - hemodynamically stable.   Delay start of Pharmacological VTE agent (>24hrs) due to surgical blood loss or risk of bleeding: not applicable

## 2022-10-23 NOTE — Anesthesia Procedure Notes (Signed)
Procedure Name: LMA Insertion Date/Time: 10/23/2022 3:05 PM  Performed by: Ezequiel Kayser, CRNAPre-anesthesia Checklist: Patient identified, Emergency Drugs available, Suction available and Patient being monitored Patient Re-evaluated:Patient Re-evaluated prior to induction Oxygen Delivery Method: Circle System Utilized Preoxygenation: Pre-oxygenation with 100% oxygen Induction Type: IV induction Ventilation: Mask ventilation without difficulty LMA: LMA inserted LMA Size: 3.0 Number of attempts: 1 Airway Equipment and Method: Bite block Placement Confirmation: positive ETCO2 Tube secured with: Tape Dental Injury: Teeth and Oropharynx as per pre-operative assessment

## 2022-10-23 NOTE — Interval H&P Note (Signed)
History and Physical Interval Note:  10/23/2022 2:43 PM  [name] [name]  has presented today for surgery, with the diagnosis of [place].  The various methods of treatment have been discussed with the patient and family. After consideration of risks, benefits and other options for treatment, the patient has consented to  Procedure(s): RIGHT BREAST LUMPECTOMY WITH RADIOACTIVE SEED LOCALIZATION (Right) as a surgical intervention.  The patient's history has been reviewed, patient examined, no change in status, stable for surgery.  I have reviewed the patient's chart and labs.  Questions were answered to the patient's satisfaction.     Autumn Messing III

## 2022-10-23 NOTE — Anesthesia Preprocedure Evaluation (Addendum)
Anesthesia Evaluation  Patient identified by MRN, date of birth, ID band Patient awake    Reviewed: Allergy & Precautions, NPO status , Patient's Chart, lab work & pertinent test results  History of Anesthesia Complications (+) PONV and history of anesthetic complications  Airway Mallampati: II  TM Distance: >3 FB Neck ROM: Full    Dental  (+) Dental Advisory Given, Teeth Intact   Pulmonary neg pulmonary ROS   Pulmonary exam normal        Cardiovascular Normal cardiovascular exam+ dysrhythmias Atrial Fibrillation      Neuro/Psych  PSYCHIATRIC DISORDERS Anxiety     negative neurological ROS     GI/Hepatic negative GI ROS, Neg liver ROS,,,  Endo/Other  negative endocrine ROS    Renal/GU negative Renal ROS     Musculoskeletal  (+) Arthritis ,    Abdominal   Peds  Hematology negative hematology ROS (+)   Anesthesia Other Findings   Reproductive/Obstetrics  Right breast DCIS                              Anesthesia Physical Anesthesia Plan  ASA: 3  Anesthesia Plan: General   Post-op Pain Management: Tylenol PO (pre-op)*   Induction: Intravenous  PONV Risk Score and Plan: 4 or greater and Treatment may vary due to age or medical condition, Ondansetron and TIVA  Airway Management Planned: LMA  Additional Equipment: None  Intra-op Plan:   Post-operative Plan: Extubation in OR  Informed Consent: I have reviewed the patients History and Physical, chart, labs and discussed the procedure including the risks, benefits and alternatives for the proposed anesthesia with the patient or authorized representative who has indicated his/her understanding and acceptance.     Dental advisory given  Plan Discussed with: CRNA and Anesthesiologist  Anesthesia Plan Comments:        Anesthesia Quick Evaluation

## 2022-10-23 NOTE — Discharge Instructions (Signed)
  Post Anesthesia Home Care Instructions  Activity: Get plenty of rest for the remainder of the day. A responsible individual must stay with you for 24 hours following the procedure.  For the next 24 hours, DO NOT: -Drive a car -Paediatric nurse -Drink alcoholic beverages -Take any medication unless instructed by your physician -Make any legal decisions or sign important papers.  Meals: Start with liquid foods such as gelatin or soup. Progress to regular foods as tolerated. Avoid greasy, spicy, heavy foods. If nausea and/or vomiting occur, drink only clear liquids until the nausea and/or vomiting subsides. Call your physician if vomiting continues.  Special Instructions/Symptoms: Your throat may feel dry or sore from the anesthesia or the breathing tube placed in your throat during surgery. If this causes discomfort, gargle with warm salt water. The discomfort should disappear within 24 hours.  If you had a scopolamine patch placed behind your ear for the management of post- operative nausea and/or vomiting:  1. The medication in the patch is effective for 72 hours, after which it should be removed.  Wrap patch in a tissue and discard in the trash. Wash hands thoroughly with soap and water. 2. You may remove the patch earlier than 72 hours if you experience unpleasant side effects which may include dry mouth, dizziness or visual disturbances. 3. Avoid touching the patch. Wash your hands with soap and water after contact with the patch.     Next dose of Tylenol may be taken at 8p

## 2022-10-23 NOTE — H&P (Signed)
MRN: [id number] DOB: May 06, 1946 Subjective   Chief Complaint: Follow-up (3 week follow up breast )   History of Present Illness: [name] [name] is a 76 y.o. female who is seen today for right breast calcification. 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 On her recent mammogram she was found to have some calcifications in the medial right breast measuring 1.8 cm. 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   Review of Systems: A complete review of systems was obtained from the patient. I have reviewed this information and discussed as appropriate with the patient. See HPI as well for other ROS.  ROS   Medical History: Past Medical History:  Diagnosis Date  Arrhythmia  Hyperlipidemia   Patient Active Problem List  Diagnosis  History of left breast cancer  Breast calcification, right  Ductal carcinoma in situ (DCIS) of right breast   Past Surgical History:  Procedure Laterality Date  Breast lumpectomy 07/26/2007  HYSTERECTOMY  OOPHORECTOMY    Allergies  Allergen Reactions  Naproxen Sodium Hives  Tetracycline Rash  All over the body   Current Outpatient Medications on File Prior to Visit  Medication Sig Dispense Refill  aspirin 81 MG EC tablet Take by mouth  COLLAGEN MISC Take by mouth  dilTIAZem (CARDIZEM) 30 MG tablet TAKE 1 TABLET EVERY 4 HOURS AS NEEDED FOR HEART RATE >100 AS LONG AS TOP BLOOD PRESSURE >100.  UNABLE TO FIND Med Name: Omegas & turmeric  VITAMIN D3 ORAL Take by mouth   No current facility-administered medications on file prior to visit.   Family History  Problem Relation Age of Onset  Colon cancer Son    Social History   Tobacco Use  Smoking Status Never  Smokeless Tobacco Never    Social History   Socioeconomic  History  Marital status: Single  Tobacco Use  Smoking status: Never  Smokeless tobacco: Never  Substance and Sexual Activity  Alcohol use: Never  Drug use: Never   Objective:   There were no vitals filed for this visit.  There is no height or weight on file to calculate BMI.  Physical Exam Vitals reviewed.  Constitutional:  General: She is not in acute distress. Appearance: Normal appearance.  HENT:  Head: Normocephalic and atraumatic.  Right Ear: External ear normal.  Left Ear: External ear normal.  Nose: Nose normal.  Mouth/Throat:  Mouth: Mucous membranes are moist.  Pharynx: Oropharynx is clear.  Eyes:  General: No scleral icterus. Extraocular Movements: Extraocular movements intact.  Conjunctiva/sclera: Conjunctivae normal.  Pupils: Pupils are equal, round, and reactive to light.  Cardiovascular:  Rate and Rhythm: Normal rate and regular rhythm.  Pulses: Normal pulses.  Heart sounds: Normal heart sounds.  Pulmonary:  Effort: Pulmonary effort is normal. No respiratory distress.  Breath sounds: Normal breath sounds.  Abdominal:  General: Bowel sounds are normal.  Palpations: Abdomen is soft.  Tenderness: There is no abdominal tenderness.  Musculoskeletal:  General: No swelling, tenderness or deformity. Normal range of motion.  Cervical back: Normal range of motion and neck supple.  Skin: General: Skin is warm and dry.  Coloration: Skin is not jaundiced.  Neurological:  General: No focal deficit present.  Mental Status: She is alert and oriented to person, place, and time.  Psychiatric:  Mood and Affect: Mood normal.  Behavior: Behavior normal.     Breast: There is no palpable mass in either breast. There is no palpable axillary, supraclavicular, or cervical lymphadenopathy.  Labs, Imaging and Diagnostic Testing:  Assessment and Plan:   Diagnoses and all orders for this visit:  History of left breast cancer  Ductal carcinoma in situ (DCIS) of  right breast    The patient is about 15 years status post left breast lumpectomy for breast cancer. She continues to do well with no clinical evidence of recurrence. She does have some new calcifications in the inner right breast that came back as ductal carcinoma in situ. I have discussed with her in detail the different options for treatment and at this point she favors breast conservation which I feel is very reasonable. I have discussed with her in detail the risks and benefits of the operation as well as some of the technical aspects including the use of a radioactive seed and she understands and wishes to proceed. She will not need a node evaluation. I will refer her back to medical and radiation oncology to discuss adjuvant therapy. We will proceed with surgical planning

## 2022-10-23 NOTE — Transfer of Care (Signed)
Immediate Anesthesia Transfer of Care Note  Patient: [name] [name]  Procedure(s) Performed: RIGHT BREAST LUMPECTOMY WITH RADIOACTIVE SEED LOCALIZATION (Right: Breast)  Patient Location: PACU  Anesthesia Type:General  Level of Consciousness: drowsy  Airway & Oxygen Therapy: Patient Spontanous Breathing and Patient connected to face mask oxygen  Post-op Assessment: Report given to RN and Post -op Vital signs reviewed and stable  Post vital signs: Reviewed and stable  Last Vitals:  Vitals Value Taken Time  BP 111/62   Temp    Pulse 87 10/23/22 1600  Resp 18 10/23/22 1600  SpO2 98 % 10/23/22 1600  Vitals shown include unvalidated device data.  Last Pain:  Vitals:   10/23/22 1400  TempSrc: Oral  PainSc: 0-No pain      Patients Stated Pain Goal: 3 (78/24/23 5361)  Complications: No notable events documented.

## 2022-10-24 ENCOUNTER — Encounter (HOSPITAL_BASED_OUTPATIENT_CLINIC_OR_DEPARTMENT_OTHER): Payer: Self-pay | Admitting: General Surgery

## 2022-10-26 ENCOUNTER — Ambulatory Visit: Payer: PPO | Admitting: Hematology and Oncology

## 2022-10-26 ENCOUNTER — Other Ambulatory Visit: Payer: PPO

## 2022-10-26 LAB — SURGICAL PATHOLOGY

## 2022-10-28 ENCOUNTER — Ambulatory Visit (INDEPENDENT_AMBULATORY_CARE_PROVIDER_SITE_OTHER): Payer: PPO

## 2022-10-28 VITALS — Ht 64.0 in | Wt 167.0 lb

## 2022-10-28 DIAGNOSIS — Z0000 Encounter for general adult medical examination without abnormal findings: Secondary | ICD-10-CM | POA: Diagnosis not present

## 2022-10-28 NOTE — Patient Instructions (Addendum)
[name] [name] , Thank you for taking time to come for your Medicare Wellness Visit. I appreciate your ongoing commitment to your health goals. Please review the following plan we discussed and let me know if I can assist you in the future.   These are the goals we discussed:  Goals       Stay Healthy (pt-stated)        This is a list of the screening recommended for you and due dates:  Health Maintenance  Topic Date Due   DEXA scan (bone density measurement)  Never done   DTaP/Tdap/Td vaccine (3 - Td or Tdap) 05/27/2022   COVID-19 Vaccine (1) 11/13/2022*   Zoster (Shingles) Vaccine (1 of 2) 01/27/2023*   Flu Shot  02/21/2023*   Hepatitis C Screening: USPSTF Recommendation to screen - Ages 18-79 yo.  07/25/2023*   Pneumonia Vaccine (1 - PCV) 10/29/2023*   Medicare Annual Wellness Visit  10/29/2023   HPV Vaccine  Aged Out  *Topic was postponed. The date shown is not the original due date.    Advanced directives: Please bring a copy of your health care power of attorney and living will to the office to be added to your chart at your convenience.   Conditions/risks identified: None  Next appointment: Follow up in one year for your annual wellness visit     Preventive Care 65 Years and Older, Female Preventive care refers to lifestyle choices and visits with your health care provider that can promote health and wellness. What does preventive care include? A yearly physical exam. This is also called an annual well check. Dental exams once or twice a year. Routine eye exams. Ask your health care provider how often you should have your eyes checked. Personal lifestyle choices, including: Daily care of your teeth and gums. Regular physical activity. Eating a healthy diet. Avoiding tobacco and drug use. Limiting alcohol use. Practicing safe sex. Taking low-dose aspirin every day. Taking vitamin and mineral supplements as recommended by your health care provider. What happens during  an annual well check? The services and screenings done by your health care provider during your annual well check will depend on your age, overall health, lifestyle risk factors, and family history of disease. Counseling  Your health care provider may ask you questions about your: Alcohol use. Tobacco use. Drug use. Emotional well-being. Home and relationship well-being. Sexual activity. Eating habits. History of falls. Memory and ability to understand (cognition). Work and work Statistician. Reproductive health. Screening  You may have the following tests or measurements: Height, weight, and BMI. Blood pressure. Lipid and cholesterol levels. These may be checked every 5 years, or more frequently if you are over 52 years old. Skin check. Lung cancer screening. You may have this screening every year starting at age 82 if you have a 30-pack-year history of smoking and currently smoke or have quit within the past 15 years. Fecal occult blood test (FOBT) of the stool. You may have this test every year starting at age 87. Flexible sigmoidoscopy or colonoscopy. You may have a sigmoidoscopy every 5 years or a colonoscopy every 10 years starting at age 44. Hepatitis C blood test. Hepatitis B blood test. Sexually transmitted disease (STD) testing. Diabetes screening. This is done by checking your blood sugar (glucose) after you have not eaten for a while (fasting). You may have this done every 1-3 years. Bone density scan. This is done to screen for osteoporosis. You may have this done starting at age 51.  Mammogram. This may be done every 1-2 years. Talk to your health care provider about how often you should have regular mammograms. Talk with your health care provider about your test results, treatment options, and if necessary, the need for more tests. Vaccines  Your health care provider may recommend certain vaccines, such as: Influenza vaccine. This is recommended every year. Tetanus,  diphtheria, and acellular pertussis (Tdap, Td) vaccine. You may need a Td booster every 10 years. Zoster vaccine. You may need this after age 42. Pneumococcal 13-valent conjugate (PCV13) vaccine. One dose is recommended after age 3. Pneumococcal polysaccharide (PPSV23) vaccine. One dose is recommended after age 53. Talk to your health care provider about which screenings and vaccines you need and how often you need them. This information is not intended to replace advice given to you by your health care provider. Make sure you discuss any questions you have with your health care provider. Document Released: 12/06/2015 Document Revised: 07/29/2016 Document Reviewed: 09/10/2015 Elsevier Interactive Patient Education  2017 [employer] Prevention in the Home Falls can cause injuries. They can happen to people of all ages. There are many things you can do to make your home safe and to help prevent falls. What can I do on the outside of my home? Regularly fix the edges of walkways and driveways and fix any cracks. Remove anything that might make you trip as you walk through a door, such as a raised step or threshold. Trim any bushes or trees on the path to your home. Use bright outdoor lighting. Clear any walking paths of anything that might make someone trip, such as rocks or tools. Regularly check to see if handrails are loose or broken. Make sure that both sides of any steps have handrails. Any raised decks and porches should have guardrails on the edges. Have any leaves, snow, or ice cleared regularly. Use sand or salt on walking paths during winter. Clean up any spills in your garage right away. This includes oil or grease spills. What can I do in the bathroom? Use night lights. Install grab bars by the toilet and in the tub and shower. Do not use towel bars as grab bars. Use non-skid mats or decals in the tub or shower. If you need to sit down in the shower, use a plastic,  non-slip stool. Keep the floor dry. Clean up any water that spills on the floor as soon as it happens. Remove soap buildup in the tub or shower regularly. Attach bath mats securely with double-sided non-slip rug tape. Do not have throw rugs and other things on the floor that can make you trip. What can I do in the bedroom? Use night lights. Make sure that you have a light by your bed that is easy to reach. Do not use any sheets or blankets that are too big for your bed. They should not hang down onto the floor. Have a firm chair that has side arms. You can use this for support while you get dressed. Do not have throw rugs and other things on the floor that can make you trip. What can I do in the kitchen? Clean up any spills right away. Avoid walking on wet floors. Keep items that you use a lot in easy-to-reach places. If you need to reach something above you, use a strong step stool that has a grab bar. Keep electrical cords out of the way. Do not use floor polish or wax that makes floors slippery. If  you must use wax, use non-skid floor wax. Do not have throw rugs and other things on the floor that can make you trip. What can I do with my stairs? Do not leave any items on the stairs. Make sure that there are handrails on both sides of the stairs and use them. Fix handrails that are broken or loose. Make sure that handrails are as long as the stairways. Check any carpeting to make sure that it is firmly attached to the stairs. Fix any carpet that is loose or worn. Avoid having throw rugs at the top or bottom of the stairs. If you do have throw rugs, attach them to the floor with carpet tape. Make sure that you have a light switch at the top of the stairs and the bottom of the stairs. If you do not have them, ask someone to add them for you. What else can I do to help prevent falls? Wear shoes that: Do not have high heels. Have rubber bottoms. Are comfortable and fit you well. Are closed  at the toe. Do not wear sandals. If you use a stepladder: Make sure that it is fully opened. Do not climb a closed stepladder. Make sure that both sides of the stepladder are locked into place. Ask someone to hold it for you, if possible. Clearly mark and make sure that you can see: Any grab bars or handrails. First and last steps. Where the edge of each step is. Use tools that help you move around (mobility aids) if they are needed. These include: Canes. Walkers. Scooters. Crutches. Turn on the lights when you go into a dark area. Replace any light bulbs as soon as they burn out. Set up your furniture so you have a clear path. Avoid moving your furniture around. If any of your floors are uneven, fix them. If there are any pets around you, be aware of where they are. Review your medicines with your doctor. Some medicines can make you feel dizzy. This can increase your chance of falling. Ask your doctor what other things that you can do to help prevent falls. This information is not intended to replace advice given to you by your health care provider. Make sure you discuss any questions you have with your health care provider. Document Released: 09/05/2009 Document Revised: 04/16/2016 Document Reviewed: 12/14/2014 Elsevier Interactive Patient Education  2017 [employer].

## 2022-10-28 NOTE — Progress Notes (Signed)
Subjective:   [name] [name] is a 76 y.o. female who presents for Medicare Annual (Subsequent) preventive examination.  Review of Systems    Virtual Visit via Telephone Note  I connected with  [name] [name] on 10/28/22 at  8:15 AM EST by telephone and verified that I am speaking with the correct person using two identifiers.  Location: Patient: Home Provider: Office Persons participating in the virtual visit: patient/Nurse Health Advisor   I discussed the limitations, risks, security and privacy concerns of performing an evaluation and management service by telephone and the availability of in person appointments. The patient expressed understanding and agreed to proceed.  Interactive audio and video telecommunications were attempted between this nurse and patient, however failed, due to patient having technical difficulties OR patient did not have access to video capability.  We continued and completed visit with audio only.  Some vital signs may be absent or patient reported.   [name] Peaches, LPN  Cardiac Risk Factors include: advanced age (>73mn, >>58women)     Objective:    Today's Vitals   10/28/22 0819 10/28/22 0820  Weight: 167 lb (75.8 kg)   Height: _0  (1.626 m)   PainSc:  0-No pain   Body mass index is 28.67 kg/m.     10/28/2022    8:26 AM 10/23/2022    1:58 PM 05/06/2022   12:17 PM 12/30/2021    5:47 PM  Advanced Directives  Does Patient Have a Medical Advance Directive? Yes No Yes Yes  Type of [place]  Living will;Healthcare Power of [place] Chart? No - copy requested     Would patient like information on creating a medical advance directive?  No - Patient declined      Current Medications (verified) Outpatient Encounter Medications as of 10/28/2022  Medication Sig   acetaminophen (TYLENOL) 500 MG tablet Take 500 mg by mouth every 6 (six) hours as needed for  moderate pain.   aspirin EC 81 MG tablet Take 81 mg by mouth daily. Swallow whole.   COLLAGEN-VITAMIN C-BIOTIN PO Take by mouth.   diltiazem (CARDIZEM) 30 MG tablet Take 1 tablet every 4 hours AS NEEDED for heart rate >100 as long as top blood pressure >100.   OVER THE COUNTER MEDICATION Cholesterol support-nightly   OVER THE COUNTER MEDICATION Energize Electrolytes-1 scoop daily   OVER THE COUNTER MEDICATION Omegas with Turmeric-daily   oxyCODONE (ROXICODONE) 5 MG immediate release tablet Take 1 tablet (5 mg total) by mouth every 6 (six) hours as needed for severe pain.   Vitamin D-Vitamin K (VITAMIN K2-VITAMIN D3 PO) Take by mouth daily.   No facility-administered encounter medications on file as of 10/28/2022.    Allergies (verified) Naproxen sodium, Neomycin-bacitracin zn-polymyx, Tetracycline, Apixaban, Advil [ibuprofen], and Other   History: Past Medical History:  Diagnosis Date   Anxiety    Arthritis    [place]    Benign [place]    [place]   breast- [place]   Complication of anesthesia    Diverticulosis    Dysrhythmia    History of breast cancer    s/p lumpectomy and HRT   Hyperlipidemia    intolerant of all cholesterol meds   Obesity    Palpitations    PONV (postoperative nausea and vomiting)    Past Surgical History:  Procedure Laterality Date   ABDOMINAL HYSTERECTOMY  partial  per pt she still has uterus   BREAST BIOPSY Right 2011   benign   BREAST BIOPSY  10/22/2022   MM RT RADIOACTIVE SEED LOC MAMMO GUIDE 10/22/2022 [facility]   BREAST LUMPECTOMY  07/26/2007   Left lumpectomy+sln,ER+PR-,Her2-,T1bN0   BREAST LUMPECTOMY WITH RADIOACTIVE SEED LOCALIZATION Right 10/23/2022   Procedure: RIGHT BREAST LUMPECTOMY WITH RADIOACTIVE SEED LOCALIZATION;  Surgeon: Jovita Kussmaul, MD;  Location: [place];  Service: General;  Laterality: Right;   CHOLECYSTECTOMY     CP : ETT 10/11     Medical History. Walked 630 on  [place] protocl. normal ECG   CT RADIATION THERAPY GUIDE     KNEE ARTHROSCOPY     left 2004; right 2014   Left Knee Score     OOPHORECTOMY  2008   scalp surgery  2012   cylindromas- trichoepethliomas   TOTAL ABDOMINAL HYSTERECTOMY W/ BILATERAL SALPINGOOPHORECTOMY     Family History  Problem Relation Age of Onset   Cancer Mother        liver   Cancer Father        bone   Cancer Maternal Uncle        colon   Coronary artery disease Neg Hx    Social History   Socioeconomic History   Marital status: Widowed    Spouse name: Not on file   Number of children: 3   Years of education: Not on file   Highest education level: Not on file  Occupational History   Occupation: Retired Marine scientist  Tobacco Use   Smoking status: Never   Smokeless tobacco: Never   Tobacco comments:    Never smoke 04/09/22  Vaping Use   Vaping Use: Never used  Substance and Sexual Activity   Alcohol use: Not Currently    Alcohol/week: 1.0 standard drink of alcohol    Types: 1 Glasses of wine per week    Comment: occasional   Drug use: No   Sexual activity: Not Currently  Other Topics Concern   Not on file  Social History Narrative   Not on file   Social Determinants of Health   Financial Resource Strain: Low Risk  (10/28/2022)   Overall Financial Resource Strain (CARDIA)    Difficulty of Paying Living Expenses: Not hard at all  Food Insecurity: No Food Insecurity (10/28/2022)   Hunger Vital Sign    Worried About Running Out of Food in the Last Year: Never true    [place] in the Last Year: Never true  Transportation Needs: No Transportation Needs (10/28/2022)   PRAPARE - Hydrologist (Medical): No    Lack of Transportation (Non-Medical): No  Physical Activity: Sufficiently Active (10/28/2022)   Exercise Vital Sign    Days of Exercise per Week: 5 days    Minutes of Exercise per Session: 30 min  Stress: No Stress Concern Present (10/28/2022)   [name] [name]    Feeling of Stress : Not at all  Social Connections: Moderately Integrated (10/28/2022)   Social Connection and Isolation Panel [NHANES]    Frequency of Communication with Friends and Family: More than three times a week    Frequency of Social Gatherings with Friends and Family: More than three times a week    Attends Religious Services: More than 4 times per year    Active Member of [employer] or Organizations: Yes    Attends Archivist Meetings: More  than 4 times per year    Marital Status: Widowed    Tobacco Counseling Counseling given: Not Answered Tobacco comments: Never smoke 04/09/22   Clinical Intake:  Pre-visit preparation completed: No  Pain : No/denies pain Pain Score: 0-No pain     BMI - recorded: 28.67 Nutritional Status: BMI 25 -29 Overweight Nutritional Risks: None Diabetes: No  How often do you need to have someone help you when you read instructions, pamphlets, or other written materials from your doctor or pharmacy?: 1 - Never  Diabetic?  No  Interpreter Needed?: No  Information entered by :: Rolene Arbour LPN   Activities of Daily Living    10/28/2022    8:25 AM 10/23/2022    2:01 PM  In your present state of health, do you have any difficulty performing the following activities:  Hearing? 0 0  Vision? 0 0  Difficulty concentrating or making decisions? 0 0  Walking or climbing stairs? 0 0  Dressing or bathing? 0 0  Doing errands, shopping? 0   Preparing Food and eating ? N   Using the Toilet? N   In the past six months, have you accidently leaked urine? N   Do you have problems with loss of bowel control? N   Managing your Medications? N   Managing your Finances? N   Housekeeping or managing your Housekeeping? N     Patient Care Team: Farrel Conners, MD as PCP - General (Family Medicine)  Indicate any recent Medical Services you may have received from other than [facility]  providers in the past year (date may be approximate).     Assessment:   This is a routine wellness examination for Curtis.  Hearing/Vision screen Hearing Screening - Comments:: Denies hearing difficulties   Vision Screening - Comments:: Wears rx glasses - up to date with routine eye exams with  Dr Laban Emperor  Dietary issues and exercise activities discussed: Current Exercise Habits: Home exercise routine, Type of exercise: walking, Time (Minutes): 30, Frequency (Times/Week): 5, Weekly Exercise (Minutes/Week): 150, Intensity: Moderate, Exercise limited by: None identified   Goals Addressed               This Visit's Progress     Stay Healthy (pt-stated)         Depression Screen    10/28/2022    8:24 AM 07/24/2022    8:46 AM  PHQ 2/9 Scores  PHQ - 2 Score 0 0  PHQ- 9 Score  0    Fall Risk    10/28/2022    8:25 AM  [place] in the past year? 0  Number falls in past yr: 0  Injury with Fall? 0  Risk for fall due to : No Fall Risks  Follow up Falls prevention discussed    [place]:  Any stairs in or around the home? Yes  If so, are there any without handrails? No  Home free of loose throw rugs in walkways, pet beds, electrical cords, etc? Yes  Adequate lighting in your home to reduce risk of falls? Yes   ASSISTIVE DEVICES UTILIZED TO PREVENT FALLS:  Life alert? No  Use of a cane, walker or w/c? No  Grab bars in the bathroom? Yes  Shower chair or bench in shower? No  Elevated toilet seat or a handicapped toilet? No   TIMED UP AND GO:  Was the test performed? No . Audio Visit    Cognitive Function:  10/28/2022    8:26 AM  6CIT Screen  What Year? 0 points  What month? 0 points  What time? 0 points  Count back from 20 0 points  Months in reverse 0 points  Repeat phrase 0 points  Total Score 0 points    Immunizations Immunization History  Administered Date(s) Administered   Td 11/23/2006   Tdap  05/27/2012    TDAP status: Due, Education has been provided regarding the importance of this vaccine. Advised may receive this vaccine at local pharmacy or Health Dept. Aware to provide a copy of the vaccination record if obtained from local pharmacy or Health Dept. Verbalized acceptance and understanding.  Flu Vaccine status: Declined, Education has been provided regarding the importance of this vaccine but patient still declined. Advised may receive this vaccine at local pharmacy or Health Dept. Aware to provide a copy of the vaccination record if obtained from local pharmacy or Health Dept. Verbalized acceptance and understanding.  Pneumococcal vaccine status: Declined,  Education has been provided regarding the importance of this vaccine but patient still declined. Advised may receive this vaccine at local pharmacy or Health Dept. Aware to provide a copy of the vaccination record if obtained from local pharmacy or Health Dept. Verbalized acceptance and understanding.   Covid-19 vaccine status: Declined, Education has been provided regarding the importance of this vaccine but patient still declined. Advised may receive this vaccine at local pharmacy or Health Dept.or vaccine clinic. Aware to provide a copy of the vaccination record if obtained from local pharmacy or Health Dept. Verbalized acceptance and understanding.  Qualifies for Shingles Vaccine? Yes   Zostavax completed No   Shingrix Completed?: No.    Education has been provided regarding the importance of this vaccine. Patient has been advised to call insurance company to determine out of pocket expense if they have not yet received this vaccine. Advised may also receive vaccine at local pharmacy or Health Dept. Verbalized acceptance and understanding.  Screening Tests Health Maintenance  Topic Date Due   DEXA SCAN  Never done   DTaP/Tdap/Td (3 - Td or Tdap) 05/27/2022   COVID-19 Vaccine (1) 11/13/2022 (Originally 01/23/1951)   Zoster  Vaccines- Shingrix (1 of 2) 01/27/2023 (Originally 01/24/1965)   INFLUENZA VACCINE  02/21/2023 (Originally 06/23/2022)   Hepatitis C Screening  07/25/2023 (Originally 01/24/1964)   Pneumonia Vaccine 78+ Years old (1 - PCV) 10/29/2023 (Originally 01/23/2010)   Medicare Annual Wellness (AWV)  10/29/2023   HPV VACCINES  Aged Out    Health Maintenance  Health Maintenance Due  Topic Date Due   DEXA SCAN  Never done   DTaP/Tdap/Td (3 - Td or Tdap) 05/27/2022    Colorectal cancer screening: No longer required.   Mammogram status: No longer required due to Age.  Bone Density status: Ordered 07/24/22. Pt provided with contact info and advised to call to schedule appt.  Lung Cancer Screening: (Low Dose CT Chest recommended if Age 54-80 years, 30 pack-year currently smoking OR have quit w/in 15years.) does not qualify.     Additional Screening:  Hepatitis C Screening: does qualify; Completed Deferred  Vision Screening: Recommended annual ophthalmology exams for early detection of glaucoma and other disorders of the eye. Is the patient up to date with their annual eye exam?  Yes  Who is the provider or what is the name of the office in which the patient attends annual eye exams? Dr Laban Emperor If pt is not established with a provider, would they like to be referred  to a provider to establish care? No .   Dental Screening: Recommended annual dental exams for proper oral hygiene  Community Resource Referral / Chronic Care Management:  CRR required this visit?  No   CCM required this visit?  No      Plan:     I have personally reviewed and noted the following in the patient's chart:   Medical and social history Use of alcohol, tobacco or illicit drugs  Current medications and supplements including opioid prescriptions. Patient is currently taking opioid prescriptions. Information provided to patient regarding non-opioid alternatives. Patient advised to discuss non-opioid treatment plan with  their provider. Functional ability and status Nutritional status Physical activity Advanced directives List of other physicians Hospitalizations, surgeries, and ER visits in previous 12 months Vitals Screenings to include cognitive, depression, and falls Referrals and appointments  In addition, I have reviewed and discussed with patient certain preventive protocols, quality metrics, and best practice recommendations. A written personalized care plan for preventive services as well as general preventive health recommendations were provided to patient.     [name] Peaches, LPN   [id number]   Nurse Notes: Patient due Dtap/Tdap/Td(3-Td or Tdap)

## 2022-11-05 ENCOUNTER — Inpatient Hospital Stay: Payer: PPO

## 2022-11-05 ENCOUNTER — Other Ambulatory Visit: Payer: Self-pay

## 2022-11-05 ENCOUNTER — Encounter: Payer: Self-pay | Admitting: *Deleted

## 2022-11-05 ENCOUNTER — Inpatient Hospital Stay: Payer: PPO | Attending: Hematology and Oncology | Admitting: Hematology and Oncology

## 2022-11-05 VITALS — BP 154/83 | HR 64 | Temp 97.5°F | Resp 18 | Ht 64.0 in | Wt 170.0 lb

## 2022-11-05 DIAGNOSIS — Z808 Family history of malignant neoplasm of other organs or systems: Secondary | ICD-10-CM | POA: Diagnosis not present

## 2022-11-05 DIAGNOSIS — D0511 Intraductal carcinoma in situ of right breast: Secondary | ICD-10-CM

## 2022-11-05 DIAGNOSIS — Z800 Family history of malignant neoplasm of digestive organs: Secondary | ICD-10-CM | POA: Diagnosis not present

## 2022-11-05 DIAGNOSIS — Z90710 Acquired absence of both cervix and uterus: Secondary | ICD-10-CM | POA: Insufficient documentation

## 2022-11-05 NOTE — Progress Notes (Signed)
[facility] NOTE  Patient Care Team: Farrel Conners, MD as PCP - General (Family Medicine)  CHIEF COMPLAINTS/PURPOSE OF CONSULTATION:  Newly diagnosed breast cancer  HISTORY OF PRESENTING ILLNESS:  [name] [name] 76 y.o. female is here because of recent diagnosis of right breast DCIS.  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  She was under the care of Dr. Jana Hakim.  She could not tolerate antiestrogen therapy and therefore she discontinued it.  Most recently she was found to have a screening mammogram detected abnormality which on biopsy came back as DCIS in the right breast.  This was ER/PR positive.  She was seen by Dr. Marlou Starks who performed a lumpectomy.  She is here today to discuss adjuvant treatment options.  I reviewed her records extensively and collaborated the history with the patient.  SUMMARY OF ONCOLOGIC HISTORY: Oncology History  Ductal carcinoma in situ (DCIS) of right breast  09/24/2022 Initial Diagnosis   Screening mammogram detected right breast calcifications measuring 1.8 cm, biopsy revealed intermediate grade DCIS with necrosis and calcifications ER 100%, PR 80%   10/23/2022 Surgery   Right lumpectomy: Intermediate grade DCIS 1.3 cm, margins negative, ER 100%, PR 80%      MEDICAL HISTORY:  Past Medical History:  Diagnosis Date   Anxiety    Arthritis    Atrial fibrillation ([place])    Benign colon polyp    Cancer ([place]) 2012   breast- left   Complication of anesthesia    Diverticulosis    Dysrhythmia    History of breast cancer    s/p lumpectomy and HRT   Hyperlipidemia    intolerant of all cholesterol meds   Obesity    Palpitations    PONV (postoperative nausea and vomiting)     SURGICAL HISTORY: Past Surgical History:  Procedure Laterality Date   ABDOMINAL HYSTERECTOMY  partial   per pt she still has uterus   BREAST BIOPSY Right 2011   benign   BREAST BIOPSY  10/22/2022   MM RT  RADIOACTIVE SEED LOC MAMMO GUIDE 10/22/2022 [facility]MO[facility]   BREAST LUMPECTOMY  07/26/2007   Left lumpectomy+sln,ER+PR-,Her2-,T1bN0   BREAST LUMPECTOMY WITH RADIOACTIVE SEED LOCALIZATION Right 10/23/2022   Procedure: RIGHT BREAST LUMPECTOMY WITH RADIOACTIVE SEED LOCALIZATION;  Surgeon: Jovita Kussmaul, MD;  Location: [place];  Service: General;  Laterality: Right;   CHOLECYSTECTOMY     CP : ETT 10/11     Medical History. Walked 630 on [place] protocl. normal ECG   CT RADIATION THERAPY GUIDE     KNEE ARTHROSCOPY     left 2004; right 2014   Left Knee Score     OOPHORECTOMY  2008   scalp surgery  2012   cylindromas- trichoepethliomas   TOTAL ABDOMINAL HYSTERECTOMY W/ BILATERAL SALPINGOOPHORECTOMY      SOCIAL HISTORY: Social History   Socioeconomic History   Marital status: Widowed    Spouse name: Not on file   Number of children: 3   Years of education: Not on file   Highest education level: Not on file  Occupational History   Occupation: Retired Marine scientist  Tobacco Use   Smoking status: Never   Smokeless tobacco: Never   Tobacco comments:    Never smoke 04/09/22  Vaping Use   Vaping Use: Never used  Substance and Sexual Activity   Alcohol use: Not Currently    Alcohol/week: 1.0 standard drink of alcohol    Types: 1  Glasses of wine per week    Comment: occasional   Drug use: No   Sexual activity: Not Currently  Other Topics Concern   Not on file  Social History Narrative   Not on file   Social Determinants of Health   Financial Resource Strain: Low Risk  (10/28/2022)   Overall Financial Resource Strain (CARDIA)    Difficulty of Paying Living Expenses: Not hard at all  Food Insecurity: No Food Insecurity (10/28/2022)   Hunger Vital Sign    Worried About Running Out of Food in the Last Year: Never true    Ran Out of Food in the Last Year: Never true  Transportation Needs: No Transportation Needs (10/28/2022)   PRAPARE - Radiographer, therapeutic (Medical): No    Lack of Transportation (Non-Medical): No  Physical Activity: Sufficiently Active (10/28/2022)   Exercise Vital Sign    Days of Exercise per Week: 5 days    Minutes of Exercise per Session: 30 min  Stress: No Stress Concern Present (10/28/2022)   [place]    Feeling of Stress : Not at all  Social Connections: Moderately Integrated (10/28/2022)   Social Connection and Isolation Panel [NHANES]    Frequency of Communication with Friends and Family: More than three times a week    Frequency of Social Gatherings with Friends and Family: More than three times a week    Attends Religious Services: More than 4 times per year    Active Member of [employer] or Organizations: Yes    Attends Archivist Meetings: More than 4 times per year    Marital Status: Widowed  Intimate Partner Violence: Not At Risk (10/28/2022)   Humiliation, Afraid, Rape, and Kick questionnaire    Fear of Current or Ex-Partner: No    Emotionally Abused: No    Physically Abused: No    Sexually Abused: No    FAMILY HISTORY: Family History  Problem Relation Age of Onset   Cancer Mother        liver   Cancer Father        bone   Cancer Maternal Uncle        colon   Coronary artery disease Neg Hx     ALLERGIES:  is allergic to naproxen sodium, neomycin-bacitracin zn-polymyx, tetracycline, apixaban, advil [ibuprofen], and other.  MEDICATIONS:  Current Outpatient Medications  Medication Sig Dispense Refill   acetaminophen (TYLENOL) 500 MG tablet Take 500 mg by mouth every 6 (six) hours as needed for moderate pain.     aspirin EC 81 MG tablet Take 81 mg by mouth daily. Swallow whole.     COLLAGEN-VITAMIN C-BIOTIN PO Take by mouth.     diltiazem (CARDIZEM) 30 MG tablet Take 1 tablet every 4 hours AS NEEDED for heart rate >100 as long as top blood pressure >100. 30 tablet 3   OVER THE COUNTER MEDICATION Cholesterol  support-nightly     OVER THE COUNTER MEDICATION Energize Electrolytes-1 scoop daily     OVER THE COUNTER MEDICATION Omegas with Turmeric-daily     oxyCODONE (ROXICODONE) 5 MG immediate release tablet Take 1 tablet (5 mg total) by mouth every 6 (six) hours as needed for severe pain. 10 tablet 0   Vitamin D-Vitamin K (VITAMIN K2-VITAMIN D3 PO) Take by mouth daily.     No current facility-administered medications for this visit.    REVIEW OF SYSTEMS:   Constitutional: Denies fevers, chills or abnormal  night sweats   All other systems were reviewed with the patient and are negative.  PHYSICAL EXAMINATION: ECOG PERFORMANCE STATUS: 1 - Symptomatic but completely ambulatory  Vitals:   11/05/22 1318  BP: (!) 154/83  Pulse: 64  Resp: 18  Temp: (!) 97.5 F (36.4 C)  SpO2: 99%   Filed Weights   11/05/22 1318  Weight: 170 lb (77.1 kg)    GENERAL:alert, no distress and comfortable    LABORATORY DATA:  I have reviewed the data as listed Lab Results  Component Value Date   WBC 6.6 05/06/2022   HGB 15.4 (H) 05/06/2022   HCT 45.4 05/06/2022   MCV 89.4 05/06/2022   PLT 252 05/06/2022   Lab Results  Component Value Date   NA 133 (L) 05/06/2022   K 3.7 05/06/2022   CL 98 05/06/2022   CO2 20 (L) 05/06/2022    RADIOGRAPHIC STUDIES: I have personally reviewed the radiological reports and agreed with the findings in the report.  ASSESSMENT AND PLAN:  Ductal carcinoma in situ (DCIS) of right breast (History of left breast DCIS in 2008: Treated with lumpectomy radiation, could not tolerate antiestrogen therapy) 09/24/2022:Screening mammogram detected right breast calcifications measuring 1.8 cm, biopsy revealed intermediate grade DCIS with necrosis and calcifications ER 100%, PR 80% 10/23/2022: Right lumpectomy: Intermediate grade DCIS 1.3 cm, margins negative, ER 100%, PR 80%  Pathology review: I discussed with the patient the difference between DCIS and invasive breast cancer. It  is considered a precancerous lesion. DCIS is classified as a 0. It is generally detected through mammograms as calcifications. We discussed the significance of grades and its impact on prognosis. We also discussed the importance of ER and PR receptors and their implications to adjuvant treatment options. Prognosis of DCIS dependence on grade, comedo necrosis. It is anticipated that if not treated, 20-30% of DCIS can develop into invasive breast cancer.  Recommendation: 1. Adjuvant radiation therapy 2. Followed by antiestrogen therapy with tamoxifen 5 years Patient refused both radiation and antiestrogen therapy  Tamoxifen counseling: We discussed the risks and benefits of tamoxifen. These include but not limited to insomnia, hot flashes, mood changes, vaginal dryness, and weight gain. Although rare, serious side effects including endometrial cancer, risk of blood clots were also discussed. We strongly believe that the benefits far outweigh the risks. Patient understands these risks and consented to starting treatment. Planned treatment duration is 5 years.  Return to clinic on an as-needed basis.   All questions were answered. The patient knows to call the clinic with any problems, questions or concerns.    Harriette Ohara, MD 11/05/22

## 2022-11-05 NOTE — Assessment & Plan Note (Signed)
09/24/2022:Screening mammogram detected right breast calcifications measuring 1.8 cm, biopsy revealed intermediate grade DCIS with necrosis and calcifications ER 100%, PR 80% 10/23/2022: Right lumpectomy: 20 grade DCIS 1.3 cm, margins negative, ER 100%, PR 80%  Pathology review: I discussed with the patient the difference between DCIS and invasive breast cancer. It is considered a precancerous lesion. DCIS is classified as a 0. It is generally detected through mammograms as calcifications. We discussed the significance of grades and its impact on prognosis. We also discussed the importance of ER and PR receptors and their implications to adjuvant treatment options. Prognosis of DCIS dependence on grade, comedo necrosis. It is anticipated that if not treated, 20-30% of DCIS can develop into invasive breast cancer.  Recommendation: 1. Adjuvant radiation therapy 2. Followed by antiestrogen therapy with tamoxifen 5 years  Tamoxifen counseling: We discussed the risks and benefits of tamoxifen. These include but not limited to insomnia, hot flashes, mood changes, vaginal dryness, and weight gain. Although rare, serious side effects including endometrial cancer, risk of blood clots were also discussed. We strongly believe that the benefits far outweigh the risks. Patient understands these risks and consented to starting treatment. Planned treatment duration is 5 years.  Return to clinic after surgery to discuss the final pathology report and come up with an adjuvant treatment plan.

## 2022-12-10 ENCOUNTER — Encounter (HOSPITAL_COMMUNITY): Payer: Self-pay

## 2023-01-22 ENCOUNTER — Encounter: Payer: PPO | Admitting: Family Medicine

## 2023-01-27 ENCOUNTER — Ambulatory Visit (INDEPENDENT_AMBULATORY_CARE_PROVIDER_SITE_OTHER): Payer: PPO | Admitting: Family Medicine

## 2023-01-27 ENCOUNTER — Encounter: Payer: Self-pay | Admitting: Family Medicine

## 2023-01-27 VITALS — BP 122/80 | HR 59 | Temp 97.5°F | Ht 64.0 in | Wt 166.5 lb

## 2023-01-27 DIAGNOSIS — Z0000 Encounter for general adult medical examination without abnormal findings: Secondary | ICD-10-CM

## 2023-01-27 DIAGNOSIS — Z780 Asymptomatic menopausal state: Secondary | ICD-10-CM

## 2023-01-27 LAB — COMPREHENSIVE METABOLIC PANEL
ALT: 16 U/L (ref 0–35)
AST: 20 U/L (ref 0–37)
Albumin: 4 g/dL (ref 3.5–5.2)
Alkaline Phosphatase: 62 U/L (ref 39–117)
BUN: 11 mg/dL (ref 6–23)
CO2: 26 mEq/L (ref 19–32)
Calcium: 9.9 mg/dL (ref 8.4–10.5)
Chloride: 104 mEq/L (ref 96–112)
Creatinine, Ser: 0.76 mg/dL (ref 0.40–1.20)
GFR: 75.27 mL/min (ref >60.00)
Glucose, Bld: 92 mg/dL (ref 70–99)
Potassium: 4.3 mEq/L (ref 3.5–5.1)
Sodium: 140 mEq/L (ref 135–145)
Total Bilirubin: 0.6 mg/dL (ref 0.2–1.2)
Total Protein: 7.1 g/dL (ref 6.0–8.3)

## 2023-01-27 NOTE — Patient Instructions (Addendum)
Melatonin start with 5 mg, may take up to 20 mg at night if needed Health Maintenance, Female Adopting a healthy lifestyle and getting preventive care are important in promoting health and wellness. Ask your health care provider about: The right schedule for you to have regular tests and exams. Things you can do on your own to prevent diseases and keep yourself healthy. What should I know about diet, weight, and exercise? Eat a healthy diet  Eat a diet that includes plenty of vegetables, fruits, low-fat dairy products, and lean protein. Do not eat a lot of foods that are high in solid fats, added sugars, or sodium. Maintain a healthy weight Body mass index (BMI) is used to identify weight problems. It estimates body fat based on height and weight. Your health care provider can help determine your BMI and help you achieve or maintain a healthy weight. Get regular exercise Get regular exercise. This is one of the most important things you can do for your health. Most adults should: Exercise for at least 150 minutes each week. The exercise should increase your heart rate and make you sweat (moderate-intensity exercise). Do strengthening exercises at least twice a week. This is in addition to the moderate-intensity exercise. Spend less time sitting. Even light physical activity can be beneficial. Watch cholesterol and blood lipids Have your blood tested for lipids and cholesterol at 78 years of age, then have this test every 5 years. Have your cholesterol levels checked more often if: Your lipid or cholesterol levels are high. You are older than 78 years of age. You are at high risk for heart disease. What should I know about cancer screening? Depending on your health history and family history, you may need to have cancer screening at various ages. This may include screening for: Breast cancer. Cervical cancer. Colorectal cancer. Skin cancer. Lung cancer. What should I know about heart  disease, diabetes, and high blood pressure? Blood pressure and heart disease High blood pressure causes heart disease and increases the risk of stroke. This is more likely to develop in people who have high blood pressure readings or are overweight. Have your blood pressure checked: Every 3-5 years if you are 22-76 years of age. Every year if you are 25 years old or older. Diabetes Have regular diabetes screenings. This checks your fasting blood sugar level. Have the screening done: Once every three years after age 81 if you are at a normal weight and have a low risk for diabetes. More often and at a younger age if you are overweight or have a high risk for diabetes. What should I know about preventing infection? Hepatitis B If you have a higher risk for hepatitis B, you should be screened for this virus. Talk with your health care provider to find out if you are at risk for hepatitis B infection. Hepatitis C Testing is recommended for: Everyone born from [birth year] through 1966. Anyone with known risk factors for hepatitis C. Sexually transmitted infections (STIs) Get screened for STIs, including gonorrhea and chlamydia, if: You are sexually active and are younger than 78 years of age. You are older than 78 years of age and your health care provider tells you that you are at risk for this type of infection. Your sexual activity has changed since you were last screened, and you are at increased risk for chlamydia or gonorrhea. Ask your health care provider if you are at risk. Ask your health care provider about whether you are at high risk  for HIV. Your health care provider may recommend a prescription medicine to help prevent HIV infection. If you choose to take medicine to prevent HIV, you should first get tested for HIV. You should then be tested every 3 months for as long as you are taking the medicine. Pregnancy If you are about to stop having your period (premenopausal) and you may become  pregnant, seek counseling before you get pregnant. Take 400 to 800 micrograms (mcg) of folic acid every day if you become pregnant. Ask for birth control (contraception) if you want to prevent pregnancy. Osteoporosis and menopause Osteoporosis is a disease in which the bones lose minerals and strength with aging. This can result in bone fractures. If you are 97 years old or older, or if you are at risk for osteoporosis and fractures, ask your health care provider if you should: Be screened for bone loss. Take a calcium or vitamin D supplement to lower your risk of fractures. Be given hormone replacement therapy (HRT) to treat symptoms of menopause. Follow these instructions at home: Alcohol use Do not drink alcohol if: Your health care provider tells you not to drink. You are pregnant, may be pregnant, or are planning to become pregnant. If you drink alcohol: Limit how much you have to: 0-1 drink a day. Know how much alcohol is in your drink. In the [place] one drink equals one 12 oz bottle of beer (355 mL), one 5 oz glass of wine (148 mL), or one 1 oz glass of hard liquor (44 mL). Lifestyle Do not use any products that contain nicotine or tobacco. These products include cigarettes, chewing tobacco, and vaping devices, such as e-cigarettes. If you need help quitting, ask your health care provider. Do not use street drugs. Do not share needles. Ask your health care provider for help if you need support or information about quitting drugs. General instructions Schedule regular health, dental, and eye exams. Stay current with your vaccines. Tell your health care provider if: You often feel depressed. You have ever been abused or do not feel safe at home. Summary Adopting a healthy lifestyle and getting preventive care are important in promoting health and wellness. Follow your health care provider's instructions about healthy diet, exercising, and getting tested or screened for  diseases. Follow your health care provider's instructions on monitoring your cholesterol and blood pressure. This information is not intended to replace advice given to you by your health care provider. Make sure you discuss any questions you have with your health care provider. Document Revised: 03/31/2021 Document Reviewed: 03/31/2021 Elsevier Patient Education  [place].

## 2023-01-27 NOTE — Progress Notes (Signed)
Complete physical exam  Patient: [name] [name]   DOB: 06/25/1946   77 y.o. Female  MRN: [id number]  Subjective:    Chief Complaint  Patient presents with   Annual Exam    [name] [name] is a 77 y.o. female who presents today for a complete physical exam. She reports consuming a  low sugar low fat  diet. Home exercise routine includes stretching and walking 1 hrs per day. She generally feels well. She reports sleeping fairly well. She does not have additional problems to discuss today.    Most recent fall risk assessment:    01/27/2023    8:31 AM  [place] in the past year? 0  Number falls in past yr: 0  Injury with Fall? 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   aspirin EC 81 MG tablet Take 81 mg by mouth daily. Swallow whole.   COLLAGEN-VITAMIN C-BIOTIN PO Take by mouth.   diltiazem (CARDIZEM) 30 MG tablet Take 1 tablet every 4 hours AS NEEDED for heart rate >100 as long as top blood pressure >100.   OVER THE COUNTER MEDICATION Cholesterol support-nightly   OVER THE COUNTER MEDICATION Energize Electrolytes-1 scoop daily    OVER THE COUNTER MEDICATION Omegas with Turmeric-daily   oxyCODONE (ROXICODONE) 5 MG immediate release tablet Take 1 tablet (5 mg total) by mouth every 6 (six) hours as needed for severe pain.   Vitamin D-Vitamin K (VITAMIN K2-VITAMIN D3 PO) Take by mouth daily.   No facility-administered medications prior to visit.    Review of Systems  HENT:  Negative for hearing loss.   Eyes:  Negative for blurred vision.  Respiratory:  Negative for shortness of breath.   Cardiovascular:  Negative for chest pain.  Gastrointestinal: Negative.   Genitourinary: Negative.   Musculoskeletal:  Negative for back pain.  Neurological:  Negative for headaches.  Psychiatric/Behavioral:  Negative for depression.           Objective:     BP 122/80 (BP Location: Left Arm, Patient Position: Sitting, Cuff Size: Normal)   Pulse (!) 59   Temp (!) 97.5 F (36.4 C) (Oral)   Ht '5\' 4"'$  (1.626 m)   Wt 166 lb 8 oz (75.5 kg)   SpO2 99%   BMI 28.58 kg/m    Physical Exam Vitals reviewed.  Constitutional:      Appearance: Normal appearance. She is well-groomed and normal weight.  Eyes:     Conjunctiva/sclera: Conjunctivae normal.  Neck:     Thyroid: No  thyromegaly.  Cardiovascular:     Rate and Rhythm: Normal rate and regular rhythm.     Pulses: Normal pulses.     Heart sounds: S1 normal and S2 normal.  Pulmonary:     Effort: Pulmonary effort is normal.     Breath sounds: Normal breath sounds and air entry.  Abdominal:     General: Bowel sounds are normal.  Musculoskeletal:     Right lower leg: No edema.     Left lower leg: No edema.  Neurological:     Mental Status: She is alert and oriented to person, place, and time. Mental status is at baseline.     Gait: Gait is intact.  Psychiatric:        Mood and Affect: Mood and affect normal.        Speech: Speech normal.        Behavior: Behavior normal.        Judgment: Judgment normal.      No results found for any visits on 01/27/23.      Assessment & Plan:    Routine Health Maintenance and Physical Exam  Immunization History  Administered Date(s) Administered   Td 11/23/2006   Tdap 05/27/2012    Health Maintenance  Topic Date Due   DEXA SCAN  Never done   DTaP/Tdap/Td (3 - Td or Tdap) 05/27/2022   Zoster Vaccines- Shingrix (1 of 2) 01/27/2023 (Originally [birth year]24/1965)   INFLUENZA VACCINE  02/21/2023 (Originally 06/23/2022)   Hepatitis C Screening  07/25/2023 (Originally 01/24/1964)   Pneumonia Vaccine 68+ Years old (1 of 1 - PCV) 10/29/2023 (Originally 01/23/2010)   COVID-19 Vaccine (1) 01/27/2024 (Originally 01/23/1951)   Medicare Annual Wellness (AWV)  10/29/2023   HPV VACCINES  Aged Out    Discussed health benefits of physical activity, and encouraged her to engage in regular exercise appropriate for her age and condition.  Problem List Items Addressed This Visit   None Visit Diagnoses     Postmenopausal state    -  Primary   Relevant Orders   DG Bone Density   Routine physical examination       Relevant Orders   CMP     Normal physical exam findings today. We discussed using OTC melatonin 5 mg at night for her sleep interruptions. Handouts on healthy exercise and eating given to patient. No follow-ups on file.     Farrel Conners, MD

## 2023-04-26 ENCOUNTER — Ambulatory Visit: Payer: PPO | Admitting: Cardiovascular Disease

## 2023-05-26 DIAGNOSIS — M79661 Pain in right lower leg: Secondary | ICD-10-CM | POA: Diagnosis not present

## 2023-05-26 DIAGNOSIS — I83891 Varicose veins of right lower extremities with other complications: Secondary | ICD-10-CM | POA: Diagnosis not present

## 2023-05-26 DIAGNOSIS — R600 Localized edema: Secondary | ICD-10-CM | POA: Diagnosis not present

## 2023-05-26 DIAGNOSIS — I87393 Chronic venous hypertension (idiopathic) with other complications of bilateral lower extremity: Secondary | ICD-10-CM | POA: Diagnosis not present

## 2023-05-26 DIAGNOSIS — M79662 Pain in left lower leg: Secondary | ICD-10-CM | POA: Diagnosis not present

## 2023-07-28 DIAGNOSIS — H11002 Unspecified pterygium of left eye: Secondary | ICD-10-CM | POA: Diagnosis not present

## 2023-07-28 DIAGNOSIS — H25043 Posterior subcapsular polar age-related cataract, bilateral: Secondary | ICD-10-CM | POA: Diagnosis not present

## 2023-07-28 DIAGNOSIS — H2513 Age-related nuclear cataract, bilateral: Secondary | ICD-10-CM | POA: Diagnosis not present

## 2023-07-28 DIAGNOSIS — H43813 Vitreous degeneration, bilateral: Secondary | ICD-10-CM | POA: Diagnosis not present

## 2023-09-01 DIAGNOSIS — D239 Other benign neoplasm of skin, unspecified: Secondary | ICD-10-CM | POA: Diagnosis not present

## 2023-09-13 ENCOUNTER — Ambulatory Visit: Payer: PPO | Admitting: Family Medicine

## 2023-09-13 ENCOUNTER — Encounter: Payer: Self-pay | Admitting: Family Medicine

## 2023-09-13 ENCOUNTER — Ambulatory Visit (INDEPENDENT_AMBULATORY_CARE_PROVIDER_SITE_OTHER): Payer: PPO | Admitting: Family Medicine

## 2023-09-13 VITALS — BP 138/80 | HR 64 | Temp 97.6°F | Ht 64.0 in | Wt 173.4 lb

## 2023-09-13 DIAGNOSIS — M25552 Pain in left hip: Secondary | ICD-10-CM | POA: Diagnosis not present

## 2023-09-13 NOTE — Progress Notes (Signed)
Established Patient Office Visit  Subjective   Patient ID: [name] [name], female    DOB: 1946/01/10  Age: 77 y.o. MRN: [id number]  Chief Complaint  Patient presents with   Fall    Patient states she fell off of a stool while fixing mini blind, landed onto the left hip x1 week ago, complains of left hip pain, especially at night when turning over in bed, using Tylenol and Advil with some relief, did not seek treatment    Pt states she was on a step stool fixing a mini-blind and the stool collapsed, fell on the left hip. States that she hit her bottom more so than the lateral part of the hip. States that is it is hurting in the posterior section on the left side. States that she is treating herself with tylenol and ibuprofen, is able to bear weight. States that it hurts worse when she lays down and turns over in bed. Walking is ok, sitting is ok.   Fall    Current Outpatient Medications  Medication Instructions   acetaminophen (TYLENOL) 500 mg, Oral, Every 6 hours PRN   aspirin EC 81 mg, Oral, Daily, Swallow whole.   COLLAGEN-VITAMIN C-BIOTIN PO Oral   diltiazem (CARDIZEM) 30 MG tablet Take 1 tablet every 4 hours AS NEEDED for heart rate >100 as long as top blood pressure >100.   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     Objective:     BP 138/80 (BP Location: Left Arm, Patient Position: Sitting, Cuff Size: Large)   Pulse 64   Temp 97.6 F (36.4 C) (Oral)   Ht 5\' 4"  (1.626 m)   Wt 173 lb 6.4 oz (78.7 kg)   SpO2 98%   BMI 29.76 kg/m    Physical Exam Vitals reviewed.  Constitutional:      Appearance: Normal appearance. She is normal weight.  Musculoskeletal:        General: Tenderness (left iliac crest) present. No swelling, deformity or signs of injury.  Skin:    Findings: No bruising or erythema.  Neurological:     Mental Status: She is alert.      No results found for any visits on 09/13/23.    The ASCVD Risk score (Arnett DK, et al., 2019) failed to calculate for the following reasons:   The patient has a prior MI or stroke diagnosis    Assessment & Plan:  Acute hip pain, left -     DG HIP UNILAT W OR W/O PELVIS 2-3 VIEWS LEFT; Future   S/p fall on the left hip, will check X-rays of the area to rule out any fractures. I encouraged her to continue PRN tylenol and ibuprofen as needed for the pain. Muscle rubs and heating pad or ice to help with the pain.   No follow-ups on  file.    [name], MD

## 2023-09-14 ENCOUNTER — Ambulatory Visit (INDEPENDENT_AMBULATORY_CARE_PROVIDER_SITE_OTHER)
Admission: RE | Admit: 2023-09-14 | Discharge: 2023-09-14 | Disposition: A | Discharge status: Home / Self Care | Payer: PPO | Source: Ambulatory Visit | Attending: Family Medicine | Admitting: Family Medicine

## 2023-09-14 DIAGNOSIS — M25552 Pain in left hip: Secondary | ICD-10-CM | POA: Diagnosis not present

## 2023-09-14 DIAGNOSIS — R102 Pelvic and perineal pain: Secondary | ICD-10-CM | POA: Diagnosis not present

## 2023-09-14 DIAGNOSIS — M160 Bilateral primary osteoarthritis of hip: Secondary | ICD-10-CM | POA: Diagnosis not present

## 2023-09-29 ENCOUNTER — Telehealth: Payer: Self-pay | Admitting: Family Medicine

## 2023-09-29 NOTE — Telephone Encounter (Signed)
Please apologize to the patient that the result has not yet been read by the radiologist. I will let her know as soon as I get the results back

## 2023-09-29 NOTE — Telephone Encounter (Signed)
Pt is calling and would like hip xray result

## 2023-09-30 NOTE — Telephone Encounter (Signed)
Left a detailed message with the information below at the patient's cell number. ?

## 2023-10-05 DIAGNOSIS — D0511 Intraductal carcinoma in situ of right breast: Secondary | ICD-10-CM | POA: Diagnosis not present

## 2023-10-06 ENCOUNTER — Other Ambulatory Visit: Payer: Self-pay | Admitting: General Surgery

## 2023-10-06 ENCOUNTER — Encounter: Payer: Self-pay | Admitting: General Surgery

## 2023-10-06 DIAGNOSIS — D0511 Intraductal carcinoma in situ of right breast: Secondary | ICD-10-CM

## 2023-10-24 ENCOUNTER — Encounter: Payer: Self-pay | Admitting: Family Medicine

## 2023-10-24 DIAGNOSIS — M545 Low back pain, unspecified: Secondary | ICD-10-CM

## 2023-10-26 ENCOUNTER — Encounter: Payer: Self-pay | Admitting: Rehabilitative and Restorative Service Providers"

## 2023-10-26 ENCOUNTER — Ambulatory Visit: Payer: PPO | Attending: Family Medicine | Admitting: Rehabilitative and Restorative Service Providers"

## 2023-10-26 ENCOUNTER — Other Ambulatory Visit: Payer: Self-pay

## 2023-10-26 DIAGNOSIS — R252 Cramp and spasm: Secondary | ICD-10-CM

## 2023-10-26 DIAGNOSIS — M25551 Pain in right hip: Secondary | ICD-10-CM | POA: Insufficient documentation

## 2023-10-26 DIAGNOSIS — M5459 Other low back pain: Secondary | ICD-10-CM | POA: Diagnosis not present

## 2023-10-26 DIAGNOSIS — M6281 Muscle weakness (generalized): Secondary | ICD-10-CM | POA: Insufficient documentation

## 2023-10-26 DIAGNOSIS — M25552 Pain in left hip: Secondary | ICD-10-CM | POA: Diagnosis not present

## 2023-10-26 DIAGNOSIS — G8929 Other chronic pain: Secondary | ICD-10-CM | POA: Diagnosis not present

## 2023-10-26 DIAGNOSIS — M545 Low back pain, unspecified: Secondary | ICD-10-CM | POA: Insufficient documentation

## 2023-10-26 NOTE — Therapy (Signed)
OUTPATIENT PHYSICAL THERAPY LOWER EXTREMITY EVALUATION   Patient Name: [name] [name] MRN: [id number] DOB:Sep 21, 1946, 77 y.o., female Today's Date: 10/26/2023  END OF SESSION:  PT End of Session - 10/26/23 1453     Visit Number 1    Date for PT Re-Evaluation 12/17/23    Authorization Type [employer]    PT Start Time 1445    PT Stop Time 1525    PT Time Calculation (min) 40 min    Activity Tolerance Patient tolerated treatment well    Behavior During Therapy WFL for tasks assessed/performed             Past Medical History:  Diagnosis Date   Anxiety    Arthritis    Atrial fibrillation (HCC)    Benign colon polyp    Cancer (HCC) 2012   breast- left   Complication of anesthesia    Diverticulosis    Dysrhythmia    History of breast cancer    s/p lumpectomy and HRT   Hyperlipidemia    intolerant of all cholesterol meds   Obesity    Palpitations    PONV (postoperative nausea and vomiting)    Past Surgical History:  Procedure Laterality Date   ABDOMINAL HYSTERECTOMY  partial   per pt she still has uterus   BREAST BIOPSY Right 2011   benign   BREAST BIOPSY  10/22/2022   MM RT RADIOACTIVE SEED LOC MAMMO GUIDE 10/22/2022 [facility] MAMMO[facility]AP[facility]   BREAST LUMPECTOMY  07/26/2007   Left lumpectomy+sln,ER+PR-,Her2-,T1bN0   BREAST LUMPECTOMY WITH RADIOACTIVE SEED LOCALIZATION Right 10/23/2022   Procedure: RIGHT BREAST LUMPECTOMY WITH RADIOACTIVE SEED LOCALIZATION;  Surgeon: Griselda Miner, MD;  Location: [facility];  Service: General;  Laterality: Right;   CHOLECYSTECTOMY     CP : ETT 10/11     Medical History. Walked 630 on [place] protocl. normal ECG   CT RADIATION THERAPY GUIDE     KNEE ARTHROSCOPY     left 2004; right 2014   Left Knee Score     OOPHORECTOMY  2008   scalp surgery  2012   cylindromas- trichoepethliomas   TOTAL ABDOMINAL HYSTERECTOMY W/ BILATERAL SALPINGOOPHORECTOMY     Patient Active Problem List   Diagnosis Date Noted   Contact  dermatitis 09/17/2022   Paroxysmal atrial fibrillation (HCC) 12/13/2020   Secondary hypercoagulable state (HCC) 12/13/2020   Ductal carcinoma in situ (DCIS) of right breast 07/28/2011   Shortness of breath 06/25/2011   HLD (hyperlipidemia) 03/28/2010   PALPITATIONS 03/27/2010    PCP: [name], MD  REFERRING PROVIDER: [name], MD  REFERRING DIAG: [phone] (ICD-10-CM) - Chronic left-sided low back pain without sciatica  THERAPY DIAG:  Other low back pain  Cramp and spasm  Muscle weakness (generalized)  Pain of both hip joints  Rationale for Evaluation and Treatment: Rehabilitation  ONSET DATE: October 2024  SUBJECTIVE:   SUBJECTIVE STATEMENT: Patient states that in October, she was using a stool to perform overhead activities to fix her blinds/curtains when the stool gave way and she fell on her left hip.  Since that time, she has had hip and back pain.  Pt reports that pain has stayed about the same.  States that pain in the daytime is minimal, but at night, the pain is worse and she is having problems sleeping in her bed.  PERTINENT HISTORY: Breast Cancer s/p lumpectomy without lymph node removal.  OA, A-Fib PAIN:  Are you having pain? Yes: NPRS scale: 0-8/10 Pain location:  low back and bitat hips Pain description: sore, 'just hurts to move' Aggravating factors: worst at night Relieving factors: less pain during daytime  PRECAUTIONS: None  RED FLAGS: None   WEIGHT BEARING RESTRICTIONS: No  FALLS:  Has patient fallen in last 6 months? Yes. Number of falls 1 fall when she was standing on a stool and it gave out on her  LIVING ENVIRONMENT: Lives with: lives alone Lives in: House/apartment Stairs:  one level home Has following equipment at home: Grab bars  OCCUPATION: Retired  PLOF: Independent and Leisure: going out with friends, some traveling, reading, walking  PATIENT GOALS: To get some relief from the pain.  NEXT MD VISIT:  Annual Wellness visit on December 10 with [name].  OBJECTIVE:  Note: Objective measures were completed at Evaluation unless otherwise noted.  DIAGNOSTIC FINDINGS:  Hip Radiograph on 09/14/2023: IMPRESSION: 1. No acute findings. 2. Very minimal degenerative changes in the hips. 3. 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  PATIENT SURVEYS:  Eval:  LEFS 46 / 80 = 57.5 %  COGNITION: Overall cognitive status: Within functional limits for tasks assessed     SENSATION: Denies numbness and tingling  MUSCLE LENGTH: Hamstrings: Tightness bilaterally with right > left  POSTURE:  Pt with scoliosis    LOWER EXTREMITY ROM:  WFL  LOWER EXTREMITY MMT:  Eval: Bilateral hip strength of 4/5 grossly throughout Bilateral hamstring strength 4/5 Bilateral quads WFL  FUNCTIONAL TESTS:  Eval: 5 times sit to stand: 11.05 sec Single Leg Stance:  right-  26.37 sec, left - 3.26 sec  GAIT: Distance walked: >500 ft Assistive device utilized: None Level of assistance: Complete Independence Comments: Pt denies any pain with walking 1-2 miles daily   TODAY'S TREATMENT:                                                                                                                              DATE: 10/26/2023 Discussed role of PT.  Reviewed HEP and provided with handout.    PATIENT EDUCATION:  Education details: Issued HEP Person educated: Patient Education method: Explanation, Facilities manager, and Handouts Education comprehension: verbalized understanding  HOME EXERCISE PROGRAM: Access Code: [email or web address] URL: [email or web address] Date: 10/26/2023 Prepared by: Clydie Braun Earlie Schank  Exercises - Single Leg Stance with Support  - 1 x daily - 7 x weekly - 3 reps - 20 - 30 sec hold - Tandem Stance with Support  - 1 x daily - 7 x weekly - 2 reps - 20 - 30 sec hold - Seated Hamstring Stretch  - 1 x daily - 7 x weekly - 2 reps - 20  sec hold - Supine Figure 4 Piriformis Stretch  - 1 x daily - 7 x weekly - 2 reps - 20 sec hold - Supine Lower Trunk Rotation  - 1 x daily - 7 x weekly - 1-2 sets - 10 reps - Bent Knee Fallouts  - 1  x daily - 7 x weekly - 2 sets - 10 reps - Supine Posterior Pelvic Tilt  - 1 x daily - 7 x weekly - 2 sets - 10 reps - Active Straight Leg Raise with Quad Set  - 1 x daily - 7 x weekly - 2 sets - 10 reps - Supine Double Knee to Chest  - 1 x daily - 7 x weekly - 2 reps - 10-20 sec hold - Hooklying Single Knee to Chest Stretch  - 1 x daily - 7 x weekly - 2 reps - 15 sec hold  ASSESSMENT:  CLINICAL IMPRESSION: Patient is a 77 y.o. female who was seen today for physical therapy evaluation and treatment for chronic low back pain without sciatica. Patient states that she was in her usual health and pain-free lifestyle until she fell a couple of months ago when the stool that she was standing on gave way.  Patient reports that she landed on her left hip when she fell and since that time, she has had bilateral hip pain and low back pain.  Patient presents with muscle weakness, decreased balance, increased pain, and difficulty with various functional tasks.  Patient would benefit from skilled PT to address her functional impairments to allow her to return to her active lifestyle without increased pain.  OBJECTIVE IMPAIRMENTS: decreased balance, decreased strength, increased muscle spasms, and pain.   ACTIVITY LIMITATIONS: carrying, lifting, and bed mobility  PARTICIPATION LIMITATIONS: community activity  PERSONAL FACTORS: Time since onset of injury/illness/exacerbation and 1-2 comorbidities: OA, A-Fib  are also affecting patient's functional outcome.   REHAB POTENTIAL: Good  CLINICAL DECISION MAKING: Stable/uncomplicated  EVALUATION COMPLEXITY: Low   GOALS: Goals reviewed with patient? Yes  SHORT TERM GOALS: Target date: 11/12/2023 Patient will be independent with initial HEP. Baseline: Goal  status: INITIAL  2.  Patient will report at least a 25% improvement in pain since starting PT. Baseline:  Goal status: INITIAL   LONG TERM GOALS: Target date: 12/17/2023  Patient will be independent with advanced HEP to allow for self progression at discharge. Baseline:  Goal status: INITIAL  2.  Patient will increase Lower Extremity Functional Scale to at least 65% to demonstrate improvements in functional mobility/tasks. Baseline: 57.5% Goal status: INITIAL  3.  Pt to increase functional strength to [facility] to allow her to lift a case of water, carry it from her car to home, and put it away without increased pain. Baseline:  Goal status: INITIAL  4.  Pt to report at least an 80% improvement in pain to allow her to sleep in her bed and perform bed mobility without increased pain. Baseline:  Goal status: INITIAL  5.  Patient will improve single leg stance to greater than 30 seconds on each leg with good stability noted to decrease risk of falling. Baseline: right-  26.37 sec, left - 3.26 sec Goal status: INITIAL    PLAN:  PT FREQUENCY: 1-2x/week  PT DURATION: 8 weeks  PLANNED INTERVENTIONS: 97164- PT Re-evaluation, 97110-Therapeutic exercises, 97530- Therapeutic activity, 97112- Neuromuscular re-education, 97535- Self Care, 86578- Manual therapy, [id number]- Gait training, [phone]- Canalith repositioning, [id number]- Aquatic Therapy, 97014- Electrical stimulation (unattended), [id number]- Electrical stimulation (manual), [id number]- Ultrasound, [id number]- Traction (mechanical), [id number]- Ionotophoresis 4mg /ml Dexamethasone, Balance training, Stair training, Taping, Dry Needling, Joint mobilization, Joint manipulation, Spinal manipulation, Spinal mobilization, Vestibular training, Cryotherapy, and Moist heat  PLAN FOR NEXT SESSION: Assess and progress HEP as indicated, strengthening, flexibility, manual/dry needling as indicated    Reather Laurence, PT, DPT 10/26/23,  3:45 PM  [facility] [address], [address] [place], [place] [address] Phone # [phone] Fax [phone]

## 2023-11-02 ENCOUNTER — Encounter: Payer: Self-pay | Admitting: Family Medicine

## 2023-11-02 ENCOUNTER — Ambulatory Visit (INDEPENDENT_AMBULATORY_CARE_PROVIDER_SITE_OTHER): Payer: PPO | Admitting: Family Medicine

## 2023-11-02 VITALS — BP 123/76 | HR 67

## 2023-11-02 DIAGNOSIS — Z0000 Encounter for general adult medical examination without abnormal findings: Secondary | ICD-10-CM

## 2023-11-02 NOTE — Patient Instructions (Addendum)
I really enjoyed getting to talk with you today! I am available on Tuesdays and Thursdays for virtual visits if you have any questions or concerns, or if I can be of any further assistance.   CHECKLIST FROM ANNUAL WELLNESS VISIT:  -Follow up (please call to schedule if not scheduled after visit):   -yearly for annual wellness visit with primary care office  Here is a list of your preventive care/health maintenance measures and the plan for each if any are due:  PLAN For any measures below that may be due:  -get mammogram and bone density tomorrow as planned  Health Maintenance  Topic Date Due   DEXA SCAN  Never done   COVID-19 Vaccine (1) 01/27/2024 (Originally 01/23/1951)   Zoster Vaccines- Shingrix (1 of 2) 01/31/2024 (Originally 01/24/1964)   INFLUENZA VACCINE  02/21/2024 (Originally 06/24/2023)   DTaP/Tdap/Td (3 - Td or Tdap) 11/01/2024 (Originally 05/27/2022)   Pneumonia Vaccine 25+ Years old (1 of 1 - PCV) 11/01/2024 (Originally 01/23/2010)   Hepatitis C Screening  11/01/2024 (Originally 01/24/1963)   Medicare Annual Wellness (AWV)  11/01/2024   HPV VACCINES  Aged Out    -See a dentist at least yearly  -Get your eyes checked and then per your eye specialist's recommendations  -Other issues addressed today:   -I have included below further information regarding a healthy whole foods based diet, physical activity guidelines for adults, stress management and opportunities for social connections. I hope you find this information useful.   -----------------------------------------------------------------------------------------------------------------------------------------------------------------------------------------------------------------------------------------------------------  NUTRITION: -eat real food: lots of colorful vegetables (half the plate) and fruits -5-7 servings of vegetables and fruits per day (fresh or steamed is best), exp. 2 servings of vegetables with lunch and  dinner and 2 servings of fruit per day. Berries and greens such as kale and collards are great choices.  -consume on a regular basis: whole grains (make sure first ingredient on label contains the word "whole"), fresh fruits, fish, nuts, seeds, healthy oils (such as olive oil, avocado oil, grape seed oil) -may eat small amounts of dairy and lean meat on occasion, but avoid processed meats such as ham, bacon, lunch meat, etc. -drink water -try to avoid fast food and pre-packaged foods, processed meat -most experts advise limiting sodium to < 2300mg  per day, should limit further is any chronic conditions such as high blood pressure, heart disease, diabetes, etc. The American Heart Association advised that < 1500mg  is is ideal -try to avoid foods that contain any ingredients with names you do not recognize  -try to avoid sugar/sweets (except for the natural sugar that occurs in fresh fruit) -try to avoid sweet drinks -try to avoid white [place], white bread, pasta (unless whole grain), white or yellow potatoes  EXERCISE GUIDELINES FOR ADULTS: -if you wish to increase your physical activity, do so gradually and with the approval of your doctor -STOP and seek medical care immediately if you have any chest pain, chest discomfort or trouble breathing when starting or increasing exercise  -move and stretch your body, legs, feet and arms when sitting for long periods -Physical activity guidelines for optimal health in adults: -least 150 minutes per week of aerobic exercise (can talk, but not sing) once approved by your doctor, 20-30 minutes of sustained activity or two 10 minute episodes of sustained activity every day.  -resistance training at least 2 days per week if approved by your doctor -balance exercises 3+ days per week:   Stand somewhere where you have something sturdy to hold onto  if you lose balance.    1) lift up on toes, start with 5x per day and work up to 20x   2) stand and lift on leg  straight out to the side so that foot is a few inches of the floor, start with 5x each side and work up to 20x each side   3) stand on one foot, start with 5 seconds each side and work up to 20 seconds on each side  If you need ideas or help with getting more active:  -Silver sneakers https://tools.silversneakers.com  -Walk with a Doc: http://www.duncan-williams.com/  -try to include resistance (weight lifting/strength building) and balance exercises twice per week: or the following link for ideas: http://castillo-powell.com/  BuyDucts.dk  STRESS MANAGEMENT: -can try meditating, or just sitting quietly with deep breathing while intentionally relaxing all parts of your body for 5 minutes daily -if you need further help with stress, anxiety or depression please follow up with your primary doctor or contact the wonderful folks at [facility]: [phone]  SOCIAL CONNECTIONS: -options in [place] if you wish to engage in more social and exercise related activities:  -Silver sneakers https://tools.silversneakers.com  -Walk with a Doc: http://www.duncan-williams.com/  -Check out the [facility] Active Adults 50+ section on the [place] of [employer] (hiking clubs, book clubs, cards and games, chess, exercise classes, aquatic classes and much more) - see the website for details: https://www.[place].gov/departments/parks-recreation/active-adults50  -YouTube has lots of exercise videos for different ages and abilities as well  -[facility] (a variety of indoor and outdoor inperson activities for adults). (939)199-2799. [address].  -Virtual Online Classes (a variety of topics): see seniorplanet.org or call (608) [phone]  -consider volunteering at a school, hospice center, church, senior center or elsewhere

## 2023-11-02 NOTE — Progress Notes (Signed)
PATIENT CHECK-IN and HEALTH RISK ASSESSMENT QUESTIONNAIRE:  -completed by phone/video for upcoming Medicare Preventive Visit  -PLEASE SELECT "NOT IN PERSON" for the method of visit.   Pre-Visit Check-in: 1)Vitals (height, wt, BP, etc) - record in vitals section for visit on day of visit Request home vitals (wt, BP, etc.) and enter into vitals, THEN update Vital Signs SmartPhrase below at the top of the HPI. See below.  2)Review and Update Medications, Allergies PMH, Surgeries, Social history in Epic 3)Hospitalizations in the last year with date/reason? NO   4)Review and Update Care Team (patient's specialists) in Epic 5) Complete PHQ9 in Epic  6) Complete Fall Screening in Epic 7)Review all Health Maintenance Due and order under PCP if not done.  Medicare Wellness Patient Questionnaire:  Answer theses question about your habits: How often do you have a drink containing alcohol?NO  How many drinks containing alcohol do you have on a typical day when you are drinking?No  How often do you have six or more drinks on one occasion?NA  Have you ever smoked?No  Quit date if applicable? Na   How many packs a day do/did you smoke? Na  Do you use smokeless tobacco?NO  Do you use an illicit drugs?NO  On average, how many days per week do you engage in moderate to strenuous exercise (like a brisk walk)?walking 5-6 day for 30 minutes, also does stretches and strengthening exercises  On average, how many minutes do you engage in exercise at this level?30 minutes  Are you sexually active? NO Number of partners?NA  Typical breakfast: Patient doesn't eat  Typical lunch:Salad, or burger, patient doesn't eat bread  Typical dinner:egg, burger, vegetables and fruit  Typical snacks:fruit,vegetables, nuts   Beverages: Water, coffee, hot tea  Has strong faith and strong social connections One fall - was on a stepstool trying to fix the blinds and one side collapsed - feels balance is ok  Answer theses  question about your everyday activities: Can you perform most household chores?Yes  Are you deaf or have significant trouble hearing?NO  Do you feel that you have a problem with memory?Yes, sometimes - but only if stressed or if upset about something will have some mild cognitive issues...but usually comes to her Do you feel safe at home?Yes  Last dentist visit?OCT 2024 8. Do you have any difficulty performing your everyday activities?NO  Are you having any difficulty walking, taking medications on your own, and or difficulty managing daily home needs?NO  Do you have difficulty walking or climbing stairs?NO  Do you have difficulty dressing or bathing?NO  Do you have difficulty doing errands alone such as visiting a doctor's office or shopping?NO  Do you currently have any difficulty preparing food and eating?NO  Do you currently have any difficulty using the toilet?NO  Do you have any difficulty managing your finances?NO  Do you have any difficulties with housekeeping of managing your housekeeping?NO    Do you have Advanced Directives in place (Living Will, Healthcare Power or Attorney)? Yes    Last eye Exam and location?sept 2024,  Dr. Martha Clan   Do you currently use prescribed or non-prescribed narcotic or opioid pain medications?NO   Do you have a history or close family history of breast, ovarian, tubal or peritoneal cancer or a family member with BRCA (breast cancer susceptibility 1 and 2) gene mutations?Yes    Nurse/Assistant Credentials/time stamp:Leah A.Wright CMA 2:59pm     ----------------------------------------------------------------------------------------------------------------------------------------------------------------------------------------------------------------------  Because this visit was a virtual/telehealth visit, some criteria may  be missing or patient reported. Any vitals not documented were not able to be obtained and vitals that have been documented  are patient reported.    MEDICARE ANNUAL PREVENTIVE VISIT WITH PROVIDER: (Welcome to Medicare, initial annual wellness or annual wellness exam)  Virtual Visit via Phone Note  I connected with TYRONDA AVITABILE on 11/02/23 by phone and verified that I am speaking with the correct person using two identifiers.  Location patient: home Location provider:work or home office Persons participating in the virtual visit: patient, provider  Concerns and/or follow up today: no new concerns, doing PT for hip issues   See HM section in Epic for other details of completed HM.    ROS: negative for report of fevers, unintentional weight loss, vision changes, vision loss, hearing loss or change, chest pain, sob, hemoptysis, melena, hematochezia, hematuria, falls, bleeding or bruising, thoughts of suicide or self harm, memory loss  Patient-completed extensive health risk assessment - reviewed and discussed with the patient: See Health Risk Assessment completed with patient prior to the visit either above or in recent phone note. This was reviewed in detailed with the patient today and appropriate recommendations, orders and referrals were placed as needed per Summary below and patient instructions.   Review of Medical History: -PMH, PSH, Family History and current specialty and care providers reviewed and updated and listed below   Patient Care Team: [name], MD as PCP - General (Family Medicine) Nita Sells, MD (Dermatology) Griselda Miner, MD as Consulting Physician (General Surgery) Hutto, Shirlean Mylar, OD (Optometry)   Past Medical History:  Diagnosis Date   Anxiety    Arthritis    Atrial fibrillation [facility])    Benign colon polyp    Cancer [facility]) 2012   breast- left   Complication of anesthesia    Diverticulosis    Dysrhythmia    History of breast cancer    s/p lumpectomy and HRT   Hyperlipidemia    intolerant of all cholesterol meds   Obesity    Palpitations    PONV  (postoperative nausea and vomiting)     Past Surgical History:  Procedure Laterality Date   ABDOMINAL HYSTERECTOMY  partial   per pt she still has uterus   BREAST BIOPSY Right 2011   benign   BREAST BIOPSY  10/22/2022   MM RT RADIOACTIVE SEED LOC MAMMO GUIDE 10/22/2022 [facility] MAMMOGRAP[facility]   BREAST LUMPECTOMY  07/26/2007   Left lumpectomy+sln,ER+PR-,Her2-,T1bN0   BREAST LUMPECTOMY WITH RADIOACTIVE SEED LOCALIZATION Right 10/23/2022   Procedure: RIGHT BREAST LUMPECTOMY WITH RADIOACTIVE SEED LOCALIZATION;  Surgeon: Griselda Miner, MD;  Location: [facility];  Service: General;  Laterality: Right;   CHOLECYSTECTOMY     CP : ETT 10/11     Medical History. Walked 630 on [address] protocl. normal ECG   CT RADIATION THERAPY GUIDE     KNEE ARTHROSCOPY     left 2004; right 2014   Left Knee Score     OOPHORECTOMY  2008   scalp surgery  2012   cylindromas- trichoepethliomas   TOTAL ABDOMINAL HYSTERECTOMY W/ BILATERAL SALPINGOOPHORECTOMY      Social History   Socioeconomic History   Marital status: Widowed    Spouse name: Not on file   Number of children: 3   Years of education: Not on file   Highest education level: Not on file  Occupational History   Occupation: Retired Engineer, civil (consulting)  Tobacco Use   Smoking status: Never   Smokeless tobacco: Never  Tobacco comments:    Never smoke 04/09/22  Vaping Use   Vaping status: Never Used  Substance and Sexual Activity   Alcohol use: Not Currently    Alcohol/week: 1.0 standard drink of alcohol    Types: 1 Glasses of wine per week    Comment: occasional   Drug use: No   Sexual activity: Not Currently  Other Topics Concern   Not on file  Social History Narrative   Not on file   Social Determinants of Health   Financial Resource Strain: Low Risk  (10/28/2022)   Overall Financial Resource Strain (CARDIA)    Difficulty of Paying Living Expenses: Not hard at all  Food Insecurity: No Food Insecurity (10/28/2022)   Hunger Vital Sign     Worried About Running Out of Food in the Last Year: Never true    Ran Out of Food in the Last Year: Never true  Transportation Needs: No Transportation Needs (10/28/2022)   PRAPARE - Administrator, Civil Service (Medical): No    Lack of Transportation (Non-Medical): No  Physical Activity: Sufficiently Active (10/28/2022)   Exercise Vital Sign    Days of Exercise per Week: 5 days    Minutes of Exercise per Session: 30 min  Stress: No Stress Concern Present (10/28/2022)   [employer] of Occupational Health - Occupational Stress Questionnaire    Feeling of Stress : Not at all  Social Connections: Moderately Integrated (10/28/2022)   Social Connection and Isolation Panel [NHANES]    Frequency of Communication with Friends and Family: More than three times a week    Frequency of Social Gatherings with Friends and Family: More than three times a week    Attends Religious Services: More than 4 times per year    Active Member of [employer] or Organizations: Yes    Attends Banker Meetings: More than 4 times per year    Marital Status: Widowed  Intimate Partner Violence: Not At Risk (10/28/2022)   Humiliation, Afraid, Rape, and Kick questionnaire    Fear of Current or Ex-Partner: No    Emotionally Abused: No    Physically Abused: No    Sexually Abused: No    Family History  Problem Relation Age of Onset   Cancer Mother        liver   Cancer Father        bone   Cancer Maternal Uncle        colon   Coronary artery disease Neg Hx     Current Outpatient Medications on File Prior to Visit  Medication Sig Dispense Refill   acetaminophen (TYLENOL) 500 MG tablet Take 500 mg by mouth every 6 (six) hours as needed for moderate pain.     Ascorbic Acid (VITAMIN C) 1000 MG tablet Take 1,000 mg by mouth daily.     aspirin EC 81 MG tablet Take 81 mg by mouth daily. Swallow whole.     b complex vitamins capsule Take 1 capsule by mouth daily.     diltiazem (CARDIZEM) 30  MG tablet Take 1 tablet every 4 hours AS NEEDED for heart rate >100 as long as top blood pressure >100. 30 tablet 3   magnesium oxide (MAG-OX) 400 (240 Mg) MG tablet Take 400 mg by mouth daily.     Multiple Vitamin (MULTIVITAMIN) tablet Take 1 tablet by mouth daily.     OVER THE COUNTER MEDICATION Cholesterol support-nightly     OVER THE COUNTER MEDICATION Energize Electrolytes-1 scoop daily  OVER THE COUNTER MEDICATION Omegas with Turmeric-daily     Vitamin D-Vitamin K (VITAMIN K2-VITAMIN D3 PO) Take by mouth daily.     oxyCODONE (ROXICODONE) 5 MG immediate release tablet Take 1 tablet (5 mg total) by mouth every 6 (six) hours as needed for severe pain. (Patient not taking: Reported on 11/02/2023) 10 tablet 0   No current facility-administered medications on file prior to visit.    Allergies  Allergen Reactions   Naproxen Sodium Hives   Neomycin-Bacitracin Zn-Polymyx Swelling    In ear, where it was applied.    Tetracycline Rash    All over the body   Apixaban Hives   Advil [Ibuprofen] Rash   Other Rash    ALL TAPES-RASH (PT HAS LEAST RXN TO PAPER TAPE)       Physical Exam Vitals requested from patient and listed below if patient had equipment and was able to obtain at home for this virtual visit: Vitals:   11/02/23 1503  BP: 123/76  Pulse: 67   Estimated body mass index is 29.76 kg/m as calculated from the following:   Height as of 09/13/23: 5\' 4"  (1.626 m).   Weight as of 09/13/23: 173 lb 6.4 oz (78.7 kg).  EKG (optional): deferred due to virtual visit  GENERAL: alert, oriented, no acute distress detected, full vision exam deferred due to pandemic and/or virtual encounter   PSYCH/NEURO: pleasant and cooperative, no obvious depression or anxiety, speech and thought processing grossly intact, Cognitive function grossly intact  Flowsheet Row Office Visit from 07/24/2022 in [facility] [place]  PHQ-9 Total Score 0           11/02/2023     3:00 PM 01/27/2023    8:31 AM 10/28/2022    8:24 AM 07/24/2022    8:46 AM  Depression screen PHQ 2/9  Decreased Interest 0 0 0 0  Down, Depressed, Hopeless 0 0 0 0  PHQ - 2 Score 0 0 0 0  Altered sleeping    0  Tired, decreased energy    0  Change in appetite    0  Feeling bad or failure about yourself     0  Trouble concentrating    0  Moving slowly or fidgety/restless    0  Suicidal thoughts    0  PHQ-9 Score    0  Difficult doing work/chores    Not difficult at all       12/30/2021    5:36 PM 05/06/2022   12:18 PM 10/28/2022    8:25 AM 01/27/2023    8:31 AM 11/02/2023    3:00 PM  Fall Risk  Falls in the past year?   0 0 1  Was there an injury with Fall?   0 0 1  Fall Risk Category Calculator   0 0 2  Fall Risk Category (Retired)   Low    (RETIRED) Patient Fall Risk Level Moderate fall risk Moderate fall risk Low fall risk    Patient at Risk for Falls Due to   No Fall Risks No Fall Risks   Fall risk Follow up   Falls prevention discussed Falls evaluation completed Falls evaluation completed     SUMMARY AND PLAN:  Encounter for Medicare annual wellness exam   Discussed applicable health maintenance/preventive health measures and advised and referred or ordered per patient preferences: -she declines all vaccines and the hep c screening -she reports PCP ordered bone density twice for her - but nobody called  to schedule, advised staff to check on this and assist as I do see orders in for this Addendum: we were able to get her scheduled for her bone density tomorrow with her mammogram  Health Maintenance  Topic Date Due   DEXA SCAN  Never done   COVID-19 Vaccine (1) 01/27/2024 (Originally 01/23/1951)   Zoster Vaccines- Shingrix (1 of 2) 01/31/2024 (Originally 01/24/1965)   INFLUENZA VACCINE  02/21/2024 (Originally 06/24/2023)   DTaP/Tdap/Td (3 - Td or Tdap) 11/01/2024 (Originally 05/27/2022)   Pneumonia Vaccine 30+ Years old (1 of 1 - PCV) 11/01/2024 (Originally 01/23/2010)   Hepatitis  C Screening  11/01/2024 (Originally 01/24/1964)   Medicare Annual Wellness (AWV)  11/01/2024   HPV VACCINES  Aged [employer] and counseling on the following was provided based on the above review of health and a plan/checklist for the patient, along with additional information discussed, was provided for the patient in the patient instructions :   -Advised and counseled on a healthy lifestyle - including the importance of a healthy diet, regular physical activity, social connections and stress management. -Reviewed patient's current diet.  A summary of a healthy diet was provided - see Patient Instructions.  -reviewed patient's current physical activity level and discussed exercise guidelines for adults. See summary of guidelines and resources per patient instructions. Encouraged her to continue regular cardio and strength building. She does a lot of exercises provided by PT in the past as well and does not feel has any balance issues. One fall was a step stool malfunction.  -Advise yearly dental visits at minimum and regular eye exams  Follow up: see patient instructions     Patient Instructions  I really enjoyed getting to talk with you today! I am available on Tuesdays and Thursdays for virtual visits if you have any questions or concerns, or if I can be of any further assistance.   CHECKLIST FROM ANNUAL WELLNESS VISIT:  -Follow up (please call to schedule if not scheduled after visit):   -yearly for annual wellness visit with primary care office  Here is a list of your preventive care/health maintenance measures and the plan for each if any are due:  PLAN For any measures below that may be due:  -if someone does not call you today - please call the office for assistance with scheduling the bone density test  Health Maintenance  Topic Date Due   DEXA SCAN  Never done   COVID-19 Vaccine (1) 01/27/2024 (Originally 01/23/1951)   Zoster Vaccines- Shingrix (1 of 2) 01/31/2024  (Originally [birth year]24/1965)   INFLUENZA VACCINE  02/21/2024 (Originally 06/24/2023)   DTaP/Tdap/Td (3 - Td or Tdap) 11/01/2024 (Originally 05/27/2022)   Pneumonia Vaccine 2+ Years old (1 of 1 - PCV) 11/01/2024 (Originally 01/23/2010)   Hepatitis C Screening  11/01/2024 (Originally 01/24/1964)   Medicare Annual Wellness (AWV)  11/01/2024   HPV VACCINES  Aged Out    -See a dentist at least yearly  -Get your eyes checked and then per your eye specialist's recommendations  -Other issues addressed today:   -I have included below further information regarding a healthy whole foods based diet, physical activity guidelines for adults, stress management and opportunities for social connections. I hope you find this information useful.   -----------------------------------------------------------------------------------------------------------------------------------------------------------------------------------------------------------------------------------------------------------  NUTRITION: -eat real food: lots of colorful vegetables (half the plate) and fruits -5-7 servings of vegetables and fruits per day (fresh or steamed is best), exp. 2 servings of vegetables with lunch and dinner  and 2 servings of fruit per day. Berries and greens such as kale and collards are great choices.  -consume on a regular basis: whole grains (make sure first ingredient on label contains the word "whole"), fresh fruits, fish, nuts, seeds, healthy oils (such as olive oil, avocado oil, grape seed oil) -may eat small amounts of dairy and lean meat on occasion, but avoid processed meats such as ham, bacon, lunch meat, etc. -drink water -try to avoid fast food and pre-packaged foods, processed meat -most experts advise limiting sodium to < 2300mg  per day, should limit further is any chronic conditions such as high blood pressure, heart disease, diabetes, etc. The American Heart Association advised that < 1500mg  is is ideal -try  to avoid foods that contain any ingredients with names you do not recognize  -try to avoid sugar/sweets (except for the natural sugar that occurs in fresh fruit) -try to avoid sweet drinks -try to avoid white [place], white bread, pasta (unless whole grain), white or yellow potatoes  EXERCISE GUIDELINES FOR ADULTS: -if you wish to increase your physical activity, do so gradually and with the approval of your doctor -STOP and seek medical care immediately if you have any chest pain, chest discomfort or trouble breathing when starting or increasing exercise  -move and stretch your body, legs, feet and arms when sitting for long periods -Physical activity guidelines for optimal health in adults: -least 150 minutes per week of aerobic exercise (can talk, but not sing) once approved by your doctor, 20-30 minutes of sustained activity or two 10 minute episodes of sustained activity every day.  -resistance training at least 2 days per week if approved by your doctor -balance exercises 3+ days per week:   Stand somewhere where you have something sturdy to hold onto if you lose balance.    1) lift up on toes, start with 5x per day and work up to 20x   2) stand and lift on leg straight out to the side so that foot is a few inches of the floor, start with 5x each side and work up to 20x each side   3) stand on one foot, start with 5 seconds each side and work up to 20 seconds on each side  If you need ideas or help with getting more active:  -Silver sneakers https://tools.silversneakers.com  -Walk with a Doc: http://www.duncan-williams.com/  -try to include resistance (weight lifting/strength building) and balance exercises twice per week: or the following link for ideas: http://castillo-powell.com/  BuyDucts.dk  STRESS MANAGEMENT: -can try meditating, or just sitting quietly with deep breathing while intentionally  relaxing all parts of your body for 5 minutes daily -if you need further help with stress, anxiety or depression please follow up with your primary doctor or contact the wonderful folks at [facility]: [phone]  SOCIAL CONNECTIONS: -options in [place] if you wish to engage in more social and exercise related activities:  -Silver sneakers https://tools.silversneakers.com  -Walk with a Doc: http://www.duncan-williams.com/  -Check out the [facility] Active Adults 50+ section on the [place] [employer] (hiking clubs, book clubs, cards and games, chess, exercise classes, aquatic classes and much more) - see the website for details: https://www.[place].gov/departments/parks-recreation/active-adults50  -YouTube has lots of exercise videos for different ages and abilities as well  -[facility] (a variety of indoor and outdoor inperson activities for adults). [phone]. [address]  -Virtual Online Classes (a variety of topics): see seniorplanet.org or call (727)506-2098  -consider volunteering at a school, hospice  center, church, senior center or elsewhere           Terressa Koyanagi, DO

## 2023-11-03 ENCOUNTER — Ambulatory Visit
Admission: RE | Admit: 2023-11-03 | Discharge: 2023-11-03 | Disposition: A | Discharge status: Home / Self Care | Payer: PPO | Source: Ambulatory Visit | Attending: General Surgery | Admitting: General Surgery

## 2023-11-03 ENCOUNTER — Other Ambulatory Visit: Payer: Self-pay | Admitting: Family Medicine

## 2023-11-03 ENCOUNTER — Ambulatory Visit: Payer: PPO | Admitting: Rehabilitative and Restorative Service Providers"

## 2023-11-03 ENCOUNTER — Inpatient Hospital Stay: Admission: RE | Admit: 2023-11-03 | Payer: PPO | Source: Ambulatory Visit

## 2023-11-03 DIAGNOSIS — B078 Other viral warts: Secondary | ICD-10-CM | POA: Diagnosis not present

## 2023-11-03 DIAGNOSIS — D0511 Intraductal carcinoma in situ of right breast: Secondary | ICD-10-CM

## 2023-11-03 DIAGNOSIS — Z853 Personal history of malignant neoplasm of breast: Secondary | ICD-10-CM | POA: Diagnosis not present

## 2023-11-03 DIAGNOSIS — L820 Inflamed seborrheic keratosis: Secondary | ICD-10-CM | POA: Diagnosis not present

## 2023-11-03 DIAGNOSIS — Z780 Asymptomatic menopausal state: Secondary | ICD-10-CM

## 2023-11-09 ENCOUNTER — Ambulatory Visit: Payer: PPO | Admitting: Rehabilitative and Restorative Service Providers"

## 2023-11-09 ENCOUNTER — Encounter: Payer: Self-pay | Admitting: Rehabilitative and Restorative Service Providers"

## 2023-11-09 DIAGNOSIS — M6281 Muscle weakness (generalized): Secondary | ICD-10-CM

## 2023-11-09 DIAGNOSIS — M25552 Pain in left hip: Secondary | ICD-10-CM

## 2023-11-09 DIAGNOSIS — R252 Cramp and spasm: Secondary | ICD-10-CM

## 2023-11-09 DIAGNOSIS — M545 Low back pain, unspecified: Secondary | ICD-10-CM | POA: Diagnosis not present

## 2023-11-09 DIAGNOSIS — M5459 Other low back pain: Secondary | ICD-10-CM

## 2023-11-09 NOTE — Therapy (Signed)
OUTPATIENT PHYSICAL THERAPY TREATMENT NOTE   Patient Name: [name] [name] MRN: [id number] DOB:September 09, 1946, 77 y.o., female Today's Date: 11/09/2023  END OF SESSION:  PT End of Session - 11/09/23 0847     Visit Number 2    Date for PT Re-Evaluation 12/17/23    Authorization Type [employer]    PT Start Time 0845    PT Stop Time 0925    PT Time Calculation (min) 40 min    Activity Tolerance Patient tolerated treatment well    Behavior During Therapy [facility] for tasks assessed/performed             Past Medical History:  Diagnosis Date   Anxiety    Arthritis    Atrial fibrillation (HCC)    Benign colon polyp    Cancer (HCC) 2012   breast- left   Complication of anesthesia    Diverticulosis    Dysrhythmia    History of breast cancer    s/p lumpectomy and HRT   Hyperlipidemia    intolerant of all cholesterol meds   Obesity    Palpitations    PONV (postoperative nausea and vomiting)    Past Surgical History:  Procedure Laterality Date   ABDOMINAL HYSTERECTOMY  partial   per pt she still has uterus   BREAST BIOPSY Right 2011   benign   BREAST BIOPSY  10/22/2022   MM RT RADIOACTIVE SEED LOC MAMMO GUIDE 10/22/2022 [facility] MAMMOGRAPHY   BREAST LUMPECTOMY  07/26/2007   Left lumpectomy+sln,ER+PR-,Her2-,T1bN0   BREAST LUMPECTOMY WITH RADIOACTIVE SEED LOCALIZATION Right 10/23/2022   Procedure: RIGHT BREAST LUMPECTOMY WITH RADIOACTIVE SEED LOCALIZATION;  Surgeon: Griselda Miner, MD;  Location: [facility];  Service: General;  Laterality: Right;   CHOLECYSTECTOMY     CP : ETT 10/11     Medical History. Walked 630 on Bruce protocl. normal ECG   CT RADIATION THERAPY GUIDE     KNEE ARTHROSCOPY     left 2004; right 2014   Left Knee Score     OOPHORECTOMY  2008   scalp surgery  2012   cylindromas- trichoepethliomas   TOTAL ABDOMINAL HYSTERECTOMY W/ BILATERAL SALPINGOOPHORECTOMY     Patient Active Problem List   Diagnosis Date Noted   Contact dermatitis  09/17/2022   Paroxysmal atrial fibrillation (HCC) 12/13/2020   Secondary hypercoagulable state (HCC) 12/13/2020   Ductal carcinoma in situ (DCIS) of right breast 07/28/2011   Shortness of breath 06/25/2011   HLD (hyperlipidemia) 03/28/2010   PALPITATIONS 03/27/2010    PCP: [name], MD  REFERRING PROVIDER: [name], MD  REFERRING DIAG: M54.50,G89.29 (ICD-10-CM) - Chronic left-sided low back pain without sciatica  THERAPY DIAG:  Other low back pain  Cramp and spasm  Muscle weakness (generalized)  Pain of both hip joints  Rationale for Evaluation and Treatment: Rehabilitation  ONSET DATE: October 2024  SUBJECTIVE:   SUBJECTIVE STATEMENT: Patient denies current pain, but states that she does still have some pain in the mornings.  States that the exercises have been helping.  States 80-90% improvement since starting skilled PT.  PERTINENT HISTORY: Breast Cancer s/p lumpectomy without lymph node removal.  OA, A-Fib PAIN:  Are you having pain? Yes: NPRS scale: 0/10 Pain location: low back and bitat hips Pain description: sore, 'just hurts to move' Aggravating factors: worst at night Relieving factors: less pain during daytime  PRECAUTIONS: None  RED FLAGS: None   WEIGHT BEARING RESTRICTIONS: No  FALLS:  Has patient fallen in last 6  months? Yes. Number of falls 1 fall when she was standing on a stool and it gave out on her  LIVING ENVIRONMENT: Lives with: lives alone Lives in: House/apartment Stairs:  one level home Has following equipment at home: Grab bars  OCCUPATION: Retired  PLOF: Independent and Leisure: going out with friends, some traveling, reading, walking  PATIENT GOALS: To get some relief from the pain.  NEXT MD VISIT: Annual Wellness visit on December 10 with [name].  OBJECTIVE:  Note: Objective measures were completed at Evaluation unless otherwise noted.  DIAGNOSTIC FINDINGS:  Hip Radiograph on  09/14/2023: IMPRESSION: 1. No acute findings. 2. Very minimal degenerative changes in the hips. 3. 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  PATIENT SURVEYS:  Eval:  LEFS 46 / 80 = 57.5 % 11/09/2023:  Lower Extremity Functional Score: 57 / 80 = 71.3 %  COGNITION: Overall cognitive status: Within functional limits for tasks assessed     SENSATION: Denies numbness and tingling  MUSCLE LENGTH: Hamstrings: Tightness bilaterally with right > left  POSTURE:  Pt with scoliosis    LOWER EXTREMITY ROM:  WFL  LOWER EXTREMITY MMT:  Eval: Bilateral hip strength of 4/5 grossly throughout Bilateral hamstring strength 4/5 Bilateral quads WFL  FUNCTIONAL TESTS:  Eval: 5 times sit to stand: 11.05 sec Single Leg Stance:  right-  26.37 sec, left - 3.26 sec  GAIT: Distance walked: >500 ft Assistive device utilized: None Level of assistance: Complete Independence Comments: Pt denies any pain with walking 1-2 miles daily   TODAY'S TREATMENT:                                                                                                                               DATE: 11/09/2023 Nustep level 4 x6 minutes with PT present to discuss status Seated hamstring stretch 2x20 sec bilat Seated hip adduction ball squeeze 2x10 LEFS Seated with 2# ankle weights:  side step over a small cone 2x10 bilat Seated LAQ with 2# ankle weights 2x10 bilat FWD and backwards monster walking with red tband 2x8 ft Side stepping with red tband 2x8 ft bilat Seated 3 way green pball rollout 5x10 sec Sit to/from stand with 2# dumbbell in each hand:  x10 with chest press, x10 with overhead press Squat with 2# dumbbell in each hand:  cross body punches x10 Seated sciatic nerve tensioner x10 bilat   DATE: 10/26/2023 Discussed role of PT.  Reviewed HEP and provided with handout.    PATIENT EDUCATION:  Education details: Issued HEP Person educated:  Patient Education method: Explanation, Facilities manager, and Handouts Education comprehension: verbalized understanding  HOME EXERCISE PROGRAM: Access Code: [email or web address] URL: [email or web address] Date: 11/09/2023 Prepared by: Clydie Braun Johnanna Bakke  Exercises - Single Leg Stance with Support  - 1 x daily - 7 x weekly - 3 reps - 20 - 30 sec hold - Tandem Stance with Support  - 1 x daily - 7  x weekly - 2 reps - 20 - 30 sec hold - Seated Hamstring Stretch  - 1 x daily - 7 x weekly - 2 reps - 20 sec hold - Supine Figure 4 Piriformis Stretch  - 1 x daily - 7 x weekly - 2 reps - 20 sec hold - Supine Lower Trunk Rotation  - 1 x daily - 7 x weekly - 1-2 sets - 10 reps - Bent Knee Fallouts  - 1 x daily - 7 x weekly - 2 sets - 10 reps - Supine Posterior Pelvic Tilt  - 1 x daily - 7 x weekly - 2 sets - 10 reps - Active Straight Leg Raise with Quad Set  - 1 x daily - 7 x weekly - 2 sets - 10 reps - Supine Double Knee to Chest  - 1 x daily - 7 x weekly - 2 reps - 10-20 sec hold - Hooklying Single Knee to Chest Stretch  - 1 x daily - 7 x weekly - 2 reps - 15 sec hold - Forward Monster Walks  - 1 x daily - 7 x weekly - 2 sets - 10 reps - Backward Monster Walks  - 1 x daily - 7 x weekly - 2 sets - 10 reps - Side Stepping with Resistance at Ankles  - 1 x daily - 7 x weekly - 2 sets - 10 reps - Squat with Medicine [employer]  - 1 x daily - 7 x weekly - 2 sets - 10 reps  ASSESSMENT:  CLINICAL IMPRESSION: Ms Petillo presents to skilled PT stating that she is feeling better and she is sleeping better.  Patient does still report some difficulty getting in and out of the car with some pain.  Patient able to progress with exercises in clinic.  Patient provided with red theraband for use at home with updated HEP.  Patient has met all short term goals at this time and is progressing with long term goals.  Patient required hand held assist with forward monster walking, but able to complete others without UE  assist.  OBJECTIVE IMPAIRMENTS: decreased balance, decreased strength, increased muscle spasms, and pain.   ACTIVITY LIMITATIONS: carrying, lifting, and bed mobility  PARTICIPATION LIMITATIONS: community activity  PERSONAL FACTORS: Time since onset of injury/illness/exacerbation and 1-2 comorbidities: OA, A-Fib  are also affecting patient's functional outcome.   REHAB POTENTIAL: Good  CLINICAL DECISION MAKING: Stable/uncomplicated  EVALUATION COMPLEXITY: Low   GOALS: Goals reviewed with patient? Yes  SHORT TERM GOALS: Target date: 11/12/2023 Patient will be independent with initial HEP. Baseline: Goal status: Met   2.  Patient will report at least a 25% improvement in pain since starting PT. Baseline:  Goal status: Met   LONG TERM GOALS: Target date: 12/17/2023  Patient will be independent with advanced HEP to allow for self progression at discharge. Baseline:  Goal status: Ongoing  2.  Patient will increase Lower Extremity Functional Scale to at least 65% to demonstrate improvements in functional mobility/tasks. Baseline: 57.5% Goal status: Met on 11/09/2023  3.  Pt to increase functional strength to [facility] to allow her to lift a case of water, carry it from her car to home, and put it away without increased pain. Baseline:  Goal status: Ongoing  4.  Pt to report at least an 80% improvement in pain to allow her to sleep in her bed and perform bed mobility without increased pain. Baseline:  Goal status: Ongoing  5.  Patient will improve  single leg stance to greater than 30 seconds on each leg with good stability noted to decrease risk of falling. Baseline: right-  26.37 sec, left - 3.26 sec Goal status: INITIAL    PLAN:  PT FREQUENCY: 1-2x/week  PT DURATION: 8 weeks  PLANNED INTERVENTIONS: 97164- PT Re-evaluation, 97110-Therapeutic exercises, 97530- Therapeutic activity, 97112- Neuromuscular re-education, 97535- Self Care, 16109- Manual therapy, [id number]- Gait  training, [phone]- Canalith repositioning, [id number]- Aquatic Therapy, 97014- Electrical stimulation (unattended), [id number]- Electrical stimulation (manual), [id number]- Ultrasound, [id number]- Traction (mechanical), [id number]- Ionotophoresis 4mg /ml Dexamethasone, Balance training, Stair training, Taping, Dry Needling, Joint mobilization, Joint manipulation, Spinal manipulation, Spinal mobilization, Vestibular training, Cryotherapy, and Moist heat  PLAN FOR NEXT SESSION: Assess and progress HEP as indicated, strengthening, flexibility, manual/dry needling as indicated    Reather Laurence, PT, DPT 11/09/23, 9:43 AM  [facility] [address] [place], [place] [address] Phone # [phone] Fax [phone]

## 2023-11-15 ENCOUNTER — Encounter: Payer: Self-pay | Admitting: Rehabilitative and Restorative Service Providers"

## 2023-11-15 ENCOUNTER — Ambulatory Visit: Payer: PPO | Admitting: Rehabilitative and Restorative Service Providers"

## 2023-11-15 DIAGNOSIS — M25551 Pain in right hip: Secondary | ICD-10-CM

## 2023-11-15 DIAGNOSIS — M6281 Muscle weakness (generalized): Secondary | ICD-10-CM

## 2023-11-15 DIAGNOSIS — M545 Low back pain, unspecified: Secondary | ICD-10-CM | POA: Diagnosis not present

## 2023-11-15 DIAGNOSIS — M5459 Other low back pain: Secondary | ICD-10-CM

## 2023-11-15 DIAGNOSIS — R252 Cramp and spasm: Secondary | ICD-10-CM

## 2023-11-15 NOTE — Therapy (Signed)
OUTPATIENT PHYSICAL THERAPY TREATMENT NOTE AND DISCHARGE SUMMARY   Patient Name: [name] [name] MRN: [id number] DOB:[birth year], 77 y.o., female Today's Date: 11/15/2023  END OF SESSION:  PT End of Session - 11/15/23 1235     Visit Number 3    Date for PT Re-Evaluation 12/17/23    Authorization Type [employer]    PT Start Time 1230    PT Stop Time 1310    PT Time Calculation (min) 40 min    Activity Tolerance Patient tolerated treatment well    Behavior During Therapy WFL for tasks assessed/performed             Past Medical History:  Diagnosis Date   Anxiety    Arthritis    Atrial fibrillation (HCC)    Benign colon polyp    Cancer (HCC) 2012   breast- left   Complication of anesthesia    Diverticulosis    Dysrhythmia    History of breast cancer    s/p lumpectomy and HRT   Hyperlipidemia    intolerant of all cholesterol meds   Obesity    Palpitations    PONV (postoperative nausea and vomiting)    Past Surgical History:  Procedure Laterality Date   ABDOMINAL HYSTERECTOMY  partial   per pt she still has uterus   BREAST BIOPSY Right 2011   benign   BREAST BIOPSY  10/22/2022   MM RT RADIOACTIVE SEED LOC MAMMO GUIDE 10/22/2022 [facility] MAMMOGRAPHY   BREAST LUMPECTOMY  07/26/2007   Left lumpectomy+sln,ER+PR-,Her2-,T1bN0   BREAST LUMPECTOMY WITH RADIOACTIVE SEED LOCALIZATION Right 10/23/2022   Procedure: RIGHT BREAST LUMPECTOMY WITH RADIOACTIVE SEED LOCALIZATION;  Surgeon: Griselda Miner, MD;  Location: [facility];  Service: General;  Laterality: Right;   CHOLECYSTECTOMY     CP : ETT 10/11     Medical History. Walked 630 on [place] protocl. normal ECG   CT RADIATION THERAPY GUIDE     KNEE ARTHROSCOPY     left 2004; right 2014   Left Knee Score     OOPHORECTOMY  2008   scalp surgery  2012   cylindromas- trichoepethliomas   TOTAL ABDOMINAL HYSTERECTOMY W/ BILATERAL SALPINGOOPHORECTOMY     Patient Active Problem List   Diagnosis Date Noted    Contact dermatitis 09/17/2022   Paroxysmal atrial fibrillation (HCC) 12/13/2020   Secondary hypercoagulable state (HCC) 12/13/2020   Ductal carcinoma in situ (DCIS) of right breast 07/28/2011   Shortness of breath 06/25/2011   HLD (hyperlipidemia) 03/28/2010   PALPITATIONS 03/27/2010    PCP: [name], MD  REFERRING PROVIDER: [name], MD  REFERRING DIAG: M54.50,G89.29 (ICD-10-CM) - Chronic left-sided low back pain without sciatica  THERAPY DIAG:  Other low back pain  Cramp and spasm  Muscle weakness (generalized)  Pain of both hip joints  Rationale for Evaluation and Treatment: Rehabilitation  ONSET DATE: October 2024  SUBJECTIVE:   SUBJECTIVE STATEMENT: Patient denies current pain.  States that she is able to return to her prior sleeping routine and able to perform all desired tasks again, as she did prior to the fall.  PERTINENT HISTORY: Breast Cancer s/p lumpectomy without lymph node removal.  OA, A-Fib PAIN:  Are you having pain? No  PRECAUTIONS: None  RED FLAGS: None   WEIGHT BEARING RESTRICTIONS: No  FALLS:  Has patient fallen in last 6 months? Yes. Number of falls 1 fall when she was standing on a stool and it gave out on her  LIVING ENVIRONMENT: Lives with:  lives alone Lives in: House/apartment Stairs:  one level home Has following equipment at home: Grab bars  OCCUPATION: Retired  PLOF: Independent and Leisure: going out with friends, some traveling, reading, walking  PATIENT GOALS: To get some relief from the pain.  NEXT MD VISIT: Annual Wellness visit on December 10 with [name].  OBJECTIVE:  Note: Objective measures were completed at Evaluation unless otherwise noted.  DIAGNOSTIC FINDINGS:  Hip Radiograph on 09/14/2023: IMPRESSION: 1. No acute findings. 2. Very minimal degenerative changes in the hips. 3. 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  PATIENT  SURVEYS:  Eval:  LEFS 46 / 80 = 57.5 % 11/09/2023:  Lower Extremity Functional Score: 57 / 80 = 71.3 %  COGNITION: Overall cognitive status: Within functional limits for tasks assessed     SENSATION: Denies numbness and tingling  MUSCLE LENGTH: Hamstrings: Tightness bilaterally with right > left  POSTURE:  Pt with scoliosis    LOWER EXTREMITY ROM:  WFL  LOWER EXTREMITY MMT:  Eval: Bilateral hip strength of 4/5 grossly throughout Bilateral hamstring strength 4/5 Bilateral quads WFL  FUNCTIONAL TESTS:  Eval: 5 times sit to stand: 11.05 sec Single Leg Stance:  right-  26.37 sec, left - 3.26 sec  11/15/2023: Single Leg Stance:  >30 sec bilat  GAIT: Distance walked: >500 ft Assistive device utilized: None Level of assistance: Complete Independence Comments: Pt denies any pain with walking 1-2 miles daily   TODAY'S TREATMENT:                                                                                                                               DATE: 11/15/2023 Nustep level 5 x6 minutes with PT present to discuss status Seated hamstring stretch 2x20 sec bilat Seated hip adduction ball squeeze 2x10 Seated thoracic extension with ball behind back 2x10 Sit to/from stand with 2# dumbbell in each hand:  x10 with chest press, x10 with overhead press Squat with 2# dumbbell in each hand:  cross body punches 2x10 Seated LAQ with 3# ankle weights 2x10 bilat Ambulation from ortho to PT gym with 3# on bilat ankles Tandem gait on AirEx beam in parallel bars down and back x2 Side stepping on AirEx beam in parallel bars down and back x2 Seated sciatic nerve tensioner x10 bilat Seated 3 way green pball rollout 5x10 sec Standing lat pulldown 25# 2x10 Single leg stance 2x20 sec bilat, then x30 sec bilat   DATE: 11/09/2023 Nustep level 4 x6 minutes with PT present to discuss status Seated hamstring stretch 2x20 sec bilat Seated hip adduction ball squeeze 2x10 LEFS Seated  with 2# ankle weights:  side step over a small cone 2x10 bilat Seated LAQ with 2# ankle weights 2x10 bilat FWD and backwards monster walking with red tband 2x8 ft Side stepping with red tband 2x8 ft bilat Seated 3 way green pball rollout 5x10 sec Sit to/from stand with 2# dumbbell  in each hand:  x10 with chest press, x10 with overhead press Squat with 2# dumbbell in each hand:  cross body punches x10 Seated sciatic nerve tensioner x10 bilat   PATIENT EDUCATION:  Education details: Issued HEP Person educated: Patient Education method: Explanation, Facilities manager, and Handouts Education comprehension: verbalized understanding  HOME EXERCISE PROGRAM: Access Code: [email or web address] URL: [email or web address] Date: 11/09/2023 Prepared by: Clydie Braun Khamille Beynon  Exercises - Single Leg Stance with Support  - 1 x daily - 7 x weekly - 3 reps - 20 - 30 sec hold - Tandem Stance with Support  - 1 x daily - 7 x weekly - 2 reps - 20 - 30 sec hold - Seated Hamstring Stretch  - 1 x daily - 7 x weekly - 2 reps - 20 sec hold - Supine Figure 4 Piriformis Stretch  - 1 x daily - 7 x weekly - 2 reps - 20 sec hold - Supine Lower Trunk Rotation  - 1 x daily - 7 x weekly - 1-2 sets - 10 reps - Bent Knee Fallouts  - 1 x daily - 7 x weekly - 2 sets - 10 reps - Supine Posterior Pelvic Tilt  - 1 x daily - 7 x weekly - 2 sets - 10 reps - Active Straight Leg Raise with Quad Set  - 1 x daily - 7 x weekly - 2 sets - 10 reps - Supine Double Knee to Chest  - 1 x daily - 7 x weekly - 2 reps - 10-20 sec hold - Hooklying Single Knee to Chest Stretch  - 1 x daily - 7 x weekly - 2 reps - 15 sec hold - Forward Monster Walks  - 1 x daily - 7 x weekly - 2 sets - 10 reps - Backward Monster Walks  - 1 x daily - 7 x weekly - 2 sets - 10 reps - Side Stepping with Resistance at Ankles  - 1 x daily - 7 x weekly - 2 sets - 10 reps - Squat with Medicine [employer]  - 1 x daily - 7 x weekly - 2 sets - 10  reps  ASSESSMENT:  CLINICAL IMPRESSION: Ms Klamm presents to skilled PT stating that she is feeling better and able to return to her typical activities.  Patient with good participation and has improved greatly with her balance.  Patient has improved with her strength and is able to return to typical activities and lifting objects.  Patient has met all goals and is ready for discharge from skilled PT at this time to continue with HEP.  OBJECTIVE IMPAIRMENTS: decreased balance, decreased strength, increased muscle spasms, and pain.   ACTIVITY LIMITATIONS: carrying, lifting, and bed mobility  PARTICIPATION LIMITATIONS: community activity  PERSONAL FACTORS: Time since onset of injury/illness/exacerbation and 1-2 comorbidities: OA, A-Fib  are also affecting patient's functional outcome.   REHAB POTENTIAL: Good  CLINICAL DECISION MAKING: Stable/uncomplicated  EVALUATION COMPLEXITY: Low   GOALS: Goals reviewed with patient? Yes  SHORT TERM GOALS: Target date: 11/12/2023 Patient will be independent with initial HEP. Baseline: Goal status: Met   2.  Patient will report at least a 25% improvement in pain since starting PT. Baseline:  Goal status: Met   LONG TERM GOALS: Target date: 12/17/2023  Patient will be independent with advanced HEP to allow for self progression at discharge. Baseline:  Goal status: MET  2.  Patient will increase Lower Extremity Functional Scale to at least 65% to demonstrate improvements in functional mobility/tasks.  Baseline: 57.5% Goal status: Met on 11/09/2023  3.  Pt to increase functional strength to [facility] to allow her to lift a case of water, carry it from her car to home, and put it away without increased pain. Baseline:  Goal status: MET  4.  Pt to report at least an 80% improvement in pain to allow her to sleep in her bed and perform bed mobility without increased pain. Baseline:  Goal status: MET  5.  Patient will improve single leg stance to  greater than 30 seconds on each leg with good stability noted to decrease risk of falling. Baseline: right-  26.37 sec, left - 3.26 sec Goal status: MET    PLAN:  PT FREQUENCY: 1-2x/week  PT DURATION: 8 weeks  PLANNED INTERVENTIONS: 97164- PT Re-evaluation, 97110-Therapeutic exercises, 97530- Therapeutic activity, 97112- Neuromuscular re-education, 97535- Self Care, 62130- Manual therapy, [id number]- Gait training, [phone]- Canalith repositioning, [id number]- Aquatic Therapy, 97014- Electrical stimulation (unattended), [id number]- Electrical stimulation (manual), [id number]- Ultrasound, [id number]- Traction (mechanical), [id number]- Ionotophoresis 4mg /ml Dexamethasone, Balance training, Stair training, Taping, Dry Needling, Joint mobilization, Joint manipulation, Spinal manipulation, Spinal mobilization, Vestibular training, Cryotherapy, and Moist heat    PHYSICAL THERAPY DISCHARGE SUMMARY  Patient agrees to discharge. Patient goals were met. Patient is being discharged due to meeting the stated rehab goals.    Reather Laurence, PT, DPT 11/15/23, 1:21 PM  [facility] [address] [place], [place] [address] Phone # [phone] Fax [phone]

## 2023-12-21 DIAGNOSIS — C4449 Other specified malignant neoplasm of skin of scalp and neck: Secondary | ICD-10-CM | POA: Diagnosis not present

## 2024-01-07 ENCOUNTER — Telehealth: Payer: Self-pay | Admitting: Cardiovascular Disease

## 2024-01-07 DIAGNOSIS — D239 Other benign neoplasm of skin, unspecified: Secondary | ICD-10-CM | POA: Diagnosis not present

## 2024-01-07 NOTE — Telephone Encounter (Signed)
   Patient Name: [name] [name]  DOB: Jan 01, 1946 MRN: [id number]  Primary Cardiologist: None  Chart reviewed as part of pre-operative protocol coverage.   Per protocol patient can hold aspirin 7 days prior to procedure and should restart postprocedure when surgically safe and advised by performing provider.   Napoleon Form, Leodis Rains, NP 01/07/2024, 1:53 PM

## 2024-01-07 NOTE — Telephone Encounter (Signed)
   Pre-operative Risk Assessment    Patient Name: [name] [name]  DOB: 03/13/1946 MRN: [id number]   Date of last office visit: 09/2722 Date of next office visit: n/a   Request for Surgical Clearance    Procedure:  Excision of two lesion of scalp  Date of Surgery:  Clearance TBD                                Surgeon:  Etter Sjogren, MD Surgeon's Group or Practice Name:  [facility] Phone number:  [phone] Fax number:  [phone]   Type of Clearance Requested:  Hold 81mg  Aspirin EC, 7days prior    Type of Anesthesia:  Not Indicated   Additional requests/questions:    Signed, Narda Amber   01/07/2024, 1:45 PM

## 2024-03-14 ENCOUNTER — Ambulatory Visit (INDEPENDENT_AMBULATORY_CARE_PROVIDER_SITE_OTHER): Admitting: Family Medicine

## 2024-03-14 ENCOUNTER — Encounter: Payer: Self-pay | Admitting: Family Medicine

## 2024-03-14 VITALS — BP 116/70 | HR 56 | Temp 97.4°F | Ht 64.0 in | Wt 170.4 lb

## 2024-03-14 DIAGNOSIS — S30861A Insect bite (nonvenomous) of abdominal wall, initial encounter: Secondary | ICD-10-CM | POA: Diagnosis not present

## 2024-03-14 DIAGNOSIS — W57XXXA Bitten or stung by nonvenomous insect and other nonvenomous arthropods, initial encounter: Secondary | ICD-10-CM

## 2024-03-14 MED ORDER — TRIAMCINOLONE ACETONIDE 0.1 % EX CREA: 1.0000 | TOPICAL_CREAM | Freq: Two times a day (BID) | CUTANEOUS | 0 refills | Status: DC

## 2024-03-14 NOTE — Progress Notes (Signed)
   Acute Office Visit  Subjective:     Patient ID: [name] [name], female    DOB: [birth year], 78 y.o.   MRN: [id number]  Chief Complaint  Patient presents with   Rash    Patient complains of itchy rash noted along the abdomen x1 week, noticed "piece of black tissue", not sure what it was and noticed white-red rash around the area    Rash   Patient is in today for itchy rash for 1 week. States that there was a "piece of black" that was stuck to her, had a "hole" there in her skin. States that it was round/circular lesion, didn't see any bugs or anything on her skin. States she was outside doing gardening last week. The rash is about the size of a quarter. She was putting rubbing alcohol on it to stop the itching and thinks it has gotten better since it started.   Review of Systems  Skin:  Positive for rash.  All other systems reviewed and are negative.       Objective:    BP 116/70   Pulse (!) 56   Temp (!) 97.4 F (36.3 C) (Oral)   Ht 5\' 4"  (1.626 m)   Wt 170 lb 6.4 oz (77.3 kg)   SpO2 98%   BMI 29.25 kg/m    Physical Exam Vitals reviewed.  Constitutional:      Appearance: Normal appearance.  Skin:    Findings: Lesion (left lower abdomen there is a round erythematous lesion with a central opening in the skin, there is no induration or fluctuance, no drainage, the skin is slightly raised in the area) present.  Neurological:     Mental Status: She is alert and oriented to person, place, and time.   No areas of central clearing of the lesion  No results found for any visits on 03/14/24.      Assessment & Plan:   Problem List Items Addressed This Visit   None Visit Diagnoses       Insect bite of abdominal wall, initial encounter    -  Primary   Relevant Medications   triamcinolone  cream (KENALOG ) 0.1 %     Most likely an allergic reaction to an insect sting, I do not think this is a tick bite/ lymes as the lesion is not a target lesion. Pt reports it is already  healing, will treat with PRN triamcinolone  cream to speed the process.   Meds ordered this encounter  Medications   triamcinolone  cream (KENALOG ) 0.1 %    Sig: Apply 1 Application topically 2 (two) times daily.    Dispense:  30 g    Refill:  0    Return for annual physical exam -- ok to schedule anytime.  Aida House, MD

## 2024-04-14 ENCOUNTER — Ambulatory Visit (INDEPENDENT_AMBULATORY_CARE_PROVIDER_SITE_OTHER): Admitting: Family Medicine

## 2024-04-14 ENCOUNTER — Encounter: Payer: Self-pay | Admitting: Family Medicine

## 2024-04-14 VITALS — BP 120/80 | HR 64 | Temp 97.5°F | Ht 62.5 in | Wt 168.1 lb

## 2024-04-14 DIAGNOSIS — Z131 Encounter for screening for diabetes mellitus: Secondary | ICD-10-CM

## 2024-04-14 DIAGNOSIS — E782 Mixed hyperlipidemia: Secondary | ICD-10-CM | POA: Diagnosis not present

## 2024-04-14 DIAGNOSIS — I480 Paroxysmal atrial fibrillation: Secondary | ICD-10-CM

## 2024-04-14 DIAGNOSIS — Z0000 Encounter for general adult medical examination without abnormal findings: Secondary | ICD-10-CM | POA: Diagnosis not present

## 2024-04-14 LAB — COMPREHENSIVE METABOLIC PANEL WITH GFR
ALT: 15 U/L (ref 0–35)
AST: 18 U/L (ref 0–37)
Albumin: 4.4 g/dL (ref 3.5–5.2)
Alkaline Phosphatase: 71 U/L (ref 39–117)
BUN: 18 mg/dL (ref 6–23)
CO2: 29 meq/L (ref 19–32)
Calcium: 9.7 mg/dL (ref 8.4–10.5)
Chloride: 102 meq/L (ref 96–112)
Creatinine, Ser: 0.82 mg/dL (ref 0.40–1.20)
GFR: 68.13 mL/min (ref >60.00)
Glucose, Bld: 105 mg/dL — ABNORMAL HIGH (ref 70–99)
Potassium: 4.8 meq/L (ref 3.5–5.1)
Sodium: 139 meq/L (ref 135–145)
Total Bilirubin: 0.7 mg/dL (ref 0.2–1.2)
Total Protein: 7.6 g/dL (ref 6.0–8.3)

## 2024-04-14 LAB — TSH: TSH: 1.81 u[IU]/mL (ref 0.35–5.50)

## 2024-04-14 LAB — HEMOGLOBIN A1C: Hgb A1c MFr Bld: 5.9 % (ref 4.6–6.5)

## 2024-04-14 MED ORDER — DILTIAZEM HCL 30 MG PO TABS: ORAL_TABLET | ORAL | 3 refills | Status: DC

## 2024-04-14 NOTE — Progress Notes (Unsigned)
 Complete physical exam  Patient: [name] [name]   DOB: 12/02/1945   78 y.o. Female  MRN: [id number]  Subjective:     Chief Complaint  Patient presents with   Annual Exam    [name] [name] is a 78 y.o. female who presents today for a complete physical exam. She reports consuming a pt is on the low carb  diet. Home exercise routine includes stretching and walking 3-4 hrs per week. She generally feels well. She reports sleeping somewhat poorly, is taking magnesium to help with this. She does not have additional problems to discuss today.   CHADS VASC score is 3-- pt continues to decline anticoagulation. Pt states she is unsure if she needs to follow up with the cardiologist since she is not taking NOAC. We discussed this and I recommended a followu p EKG today for surveillance of her paroxysmal atrial fibrillation. Pt denies any chest pain, no heart palpitations or SOB.   Most recent fall risk assessment:    04/14/2024    8:32 AM  Fall Risk   Falls in the past year? 1  Number falls in past yr: 0  Injury with Fall? 1  Risk for fall due to : Impaired balance/gait  Follow up Falls evaluation completed     Most recent depression screenings:    04/14/2024    8:33 AM 11/02/2023    3:00 PM  PHQ 2/9 Scores  PHQ - 2 Score 0 0  PHQ- 9 Score 1     Vision:Within last year and Dr. 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   Ascorbic Acid (VITAMIN C) 1000 MG tablet Take 1,000 mg by mouth daily.   aspirin EC 81 MG tablet Take 81 mg by mouth daily. Swallow whole.   b complex vitamins capsule Take 1 capsule by mouth daily.   magnesium oxide (MAG-OX) 400 (240 Mg) MG tablet Take 400 mg by mouth daily.   Moringa Oleifera (MORINGA PO) Take 1 g by mouth daily.   Multiple Vitamin (MULTIVITAMIN) tablet Take 1 tablet by mouth daily.   OVER THE COUNTER MEDICATION Cholesterol support-nightly   OVER THE COUNTER MEDICATION Energize Electrolytes-1 scoop daily   OVER THE COUNTER MEDICATION Omegas with Turmeric-daily   oxyCODONE  (ROXICODONE ) 5 MG immediate release tablet Take 1 tablet (5 mg total) by mouth every 6 (six) hours as needed for severe pain.   triamcinolone  cream (KENALOG ) 0.1 % Apply 1 Application topically 2 (two) times daily.   Vitamin D -Vitamin K (VITAMIN K2-VITAMIN D3 PO) Take by mouth daily.   [DISCONTINUED] diltiazem  (CARDIZEM ) 30 MG tablet Take 1 tablet every 4 hours AS NEEDED for heart rate >100 as long  as top blood pressure >100.   No facility-administered medications prior to visit.    Review of Systems  HENT:  Negative for hearing loss.   Eyes:  Negative for blurred vision.  Respiratory:  Negative for shortness of breath.   Cardiovascular:  Negative for chest pain.  Gastrointestinal: Negative.   Genitourinary: Negative.   Musculoskeletal:  Negative for back pain.  Neurological:  Negative for headaches.  Psychiatric/Behavioral:  Negative for depression.        Objective:     BP 120/80   Pulse 64   Temp (!) 97.5 F (36.4 C) (Oral)   Ht 5' 2.5" (1.588 m)   Wt 168 lb 1.6 oz (76.2 kg)   SpO2 97%   BMI 30.26 kg/m    Physical Exam Vitals  reviewed.  Constitutional:      Appearance: Normal appearance. She is well-groomed and normal weight.  HENT:     Right Ear: Tympanic membrane and ear canal normal.     Left Ear: Tympanic membrane and ear canal normal.     Mouth/Throat:     Mouth: Mucous membranes are moist.     Pharynx: No posterior oropharyngeal erythema.  Eyes:     Conjunctiva/sclera: Conjunctivae normal.  Neck:     Thyroid : No thyromegaly.  Cardiovascular:     Rate and Rhythm: Normal rate and regular rhythm.     Pulses: Normal pulses.     Heart sounds: S1 normal and S2 normal.  Pulmonary:     Effort: Pulmonary effort is normal.     Breath sounds: Normal breath sounds and air entry.  Abdominal:     General: Abdomen is flat. Bowel sounds are normal.     Palpations: Abdomen is soft.  Musculoskeletal:     Right lower leg: No edema.     Left lower leg: No edema.  Lymphadenopathy:     Cervical: No cervical adenopathy.  Neurological:     Mental Status: She is alert and oriented to person, place, and time. Mental status is at baseline.     Gait: Gait is intact.  Psychiatric:        Mood and Affect: Mood and affect normal.        Speech: Speech normal.        Behavior: Behavior normal.        Judgment: Judgment normal.   EKG findings   EKG compared to previous EKG dated 10/19/2022. Today there is Sinus bradycardia at a rate of 55, otherwise normal morphology, no ST or T wave abnormalities.        Assessment & Plan:    Routine Health Maintenance and Physical Exam  Immunization History  Administered Date(s) Administered   Td 11/23/2006   Tdap 05/27/2012    Health Maintenance  Topic Date Due   COVID-19 Vaccine (1) Never done   Zoster Vaccines- Shingrix (1 of 2) Never done   DEXA SCAN  Never done   DTaP/Tdap/Td (3 - Td or Tdap) 11/01/2024 (Originally 05/27/2022)   Pneumonia Vaccine 71+ Years old (1 of 1 - PCV) 11/01/2024 (Originally 01/24/1995)   Hepatitis C Screening  11/01/2024 (Originally 01/24/1964)    INFLUENZA VACCINE  06/23/2024   Medicare Annual Wellness (AWV)  11/01/2024   HPV VACCINES  Aged Out   Meningococcal B Vaccine  Aged Out    Discussed health benefits of physical activity, and encouraged her to engage in regular exercise appropriate for her age and condition.  Routine general medical examination at a health care  facility  Paroxysmal atrial fibrillation [facility]) Assessment & Plan: Under good control with cardizem  30 mg as needed. She does not necessarily need to follow up with cardiology unless she is having symptoms, she denies any cardiac sx at this time. Will continue the current plan and medications. EKG in sinus bradycardia today, similar to previous EKG's in chart.   Orders: -     EKG 12-Lead -     dilTIAZem  HCl; Take 1 tablet every 4 hours AS NEEDED for heart rate >100 as long as top blood pressure >100.  Dispense: 30 tablet; Refill: 3 -     Comprehensive metabolic panel with GFR; Future -     TSH; Future  Diabetes mellitus screening -     Hemoglobin A1c; Future  Mixed hyperlipidemia -     NMR, lipoprofile; Future  Normal physical exam findings today. Reviewed health maintenance, labs ordered for annual surveillance. RTC yearly for annual physical.   Return in 1 year (on 04/14/2025).     Aida House, MD

## 2024-04-14 NOTE — Patient Instructions (Addendum)
 Debrox ear drops fr ear wax  Health Maintenance, Female Adopting a healthy lifestyle and getting preventive care are important in promoting health and wellness. Ask your health care provider about: The right schedule for you to have regular tests and exams. Things you can do on your own to prevent diseases and keep yourself healthy. What should I know about diet, weight, and exercise? Eat a healthy diet  Eat a diet that includes plenty of vegetables, fruits, low-fat dairy products, and lean protein. Do not eat a lot of foods that are high in solid fats, added sugars, or sodium. Maintain a healthy weight Body mass index (BMI) is used to identify weight problems. It estimates body fat based on height and weight. Your health care provider can help determine your BMI and help you achieve or maintain a healthy weight. Get regular exercise Get regular exercise. This is one of the most important things you can do for your health. Most adults should: Exercise for at least 150 minutes each week. The exercise should increase your heart rate and make you sweat (moderate-intensity exercise). Do strengthening exercises at least twice a week. This is in addition to the moderate-intensity exercise. Spend less time sitting. Even light physical activity can be beneficial. Watch cholesterol and blood lipids Have your blood tested for lipids and cholesterol at 79 years of age, then have this test every 5 years. Have your cholesterol levels checked more often if: Your lipid or cholesterol levels are high. You are older than 79 years of age. You are at high risk for heart disease. What should I know about cancer screening? Depending on your health history and family history, you may need to have cancer screening at various ages. This may include screening for: Breast cancer. Cervical cancer. Colorectal cancer. Skin cancer. Lung cancer. What should I know about heart disease, diabetes, and high blood  pressure? Blood pressure and heart disease High blood pressure causes heart disease and increases the risk of stroke. This is more likely to develop in people who have high blood pressure readings or are overweight. Have your blood pressure checked: Every 3-5 years if you are 63-31 years of age. Every year if you are 62 years old or older. Diabetes Have regular diabetes screenings. This checks your fasting blood sugar level. Have the screening done: Once every three years after age 90 if you are at a normal weight and have a low risk for diabetes. More often and at a younger age if you are overweight or have a high risk for diabetes. What should I know about preventing infection? Hepatitis B If you have a higher risk for hepatitis B, you should be screened for this virus. Talk with your health care provider to find out if you are at risk for hepatitis B infection. Hepatitis C Testing is recommended for: Everyone born from [birth year] through 1966. Anyone with known risk factors for hepatitis C. Sexually transmitted infections (STIs) Get screened for STIs, including gonorrhea and chlamydia, if: You are sexually active and are younger than 79 years of age. You are older than 79 years of age and your health care provider tells you that you are at risk for this type of infection. Your sexual activity has changed since you were last screened, and you are at increased risk for chlamydia or gonorrhea. Ask your health care provider if you are at risk. Ask your health care provider about whether you are at high risk for HIV. Your health care provider may recommend  a prescription medicine to help prevent HIV infection. If you choose to take medicine to prevent HIV, you should first get tested for HIV. You should then be tested every 3 months for as long as you are taking the medicine. Pregnancy If you are about to stop having your period (premenopausal) and you may become pregnant, seek counseling before you  get pregnant. Take 400 to 800 micrograms (mcg) of folic acid every day if you become pregnant. Ask for birth control (contraception) if you want to prevent pregnancy. Osteoporosis and menopause Osteoporosis is a disease in which the bones lose minerals and strength with aging. This can result in bone fractures. If you are 39 years old or older, or if you are at risk for osteoporosis and fractures, ask your health care provider if you should: Be screened for bone loss. Take a calcium or vitamin D  supplement to lower your risk of fractures. Be given hormone replacement therapy (HRT) to treat symptoms of menopause. Follow these instructions at home: Alcohol use Do not drink alcohol if: Your health care provider tells you not to drink. You are pregnant, may be pregnant, or are planning to become pregnant. If you drink alcohol: Limit how much you have to: 0-1 drink a day. Know how much alcohol is in your drink. In the [place] one drink equals one 12 oz bottle of beer (355 mL), one 5 oz glass of wine (148 mL), or one 1 oz glass of hard liquor (44 mL). Lifestyle Do not use any products that contain nicotine or tobacco. These products include cigarettes, chewing tobacco, and vaping devices, such as e-cigarettes. If you need help quitting, ask your health care provider. Do not use street drugs. Do not share needles. Ask your health care provider for help if you need support or information about quitting drugs. General instructions Schedule regular health, dental, and eye exams. Stay current with your vaccines. Tell your health care provider if: You often feel depressed. You have ever been abused or do not feel safe at home. Summary Adopting a healthy lifestyle and getting preventive care are important in promoting health and wellness. Follow your health care provider's instructions about healthy diet, exercising, and getting tested or screened for diseases. Follow your health care provider's  instructions on monitoring your cholesterol and blood pressure. This information is not intended to replace advice given to you by your health care provider. Make sure you discuss any questions you have with your health care provider. Document Revised: 03/31/2021 Document Reviewed: 03/31/2021 Elsevier Patient Education  2024 [employer].

## 2024-04-17 LAB — NMR, LIPOPROFILE
Cholesterol, Total: 321 mg/dL — ABNORMAL HIGH (ref 100–199)
HDL Particle Number: 31.3 umol/L (ref >=30.5)
HDL-C: 91 mg/dL (ref >39)
LDL Particle Number: 1897 nmol/L — ABNORMAL HIGH (ref <1000)
LDL Size: 22.4 nm (ref >20.5)
LDL-C (NIH Calc): 212 mg/dL — ABNORMAL HIGH (ref 0–99)
LP-IR Score: <25 (ref <=45)
Small LDL Particle Number: <90 nmol/L (ref <=527)
Triglycerides: 107 mg/dL (ref 0–149)

## 2024-04-17 NOTE — Assessment & Plan Note (Signed)
 Under good control with cardizem  30 mg as needed. She does not necessarily need to follow up with cardiology unless she is having symptoms, she denies any cardiac sx at this time. Will continue the current plan and medications. EKG in sinus bradycardia today, similar to previous EKG's in chart.

## 2024-04-21 ENCOUNTER — Ambulatory Visit: Payer: Self-pay | Admitting: Family Medicine

## 2024-04-21 DIAGNOSIS — E782 Mixed hyperlipidemia: Secondary | ICD-10-CM

## 2024-05-29 NOTE — Progress Notes (Signed)
 Plastic Surgery Office Visit New Patient  Reason for Consultation/Chief Complaint: multiple cylindromas   History of Present Illness: [name] [name] is a 78 y.o. 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 She has had some removed previously by Dr. Alexa and was not pleased with the experience of going to sleep for surgery so she would prefer most to be removed under local with her awake. The one that bothers her the most is to the right temple although she has multiple across her scalp and forehead.  05/30/24 Patient reports that the lesions of her head have not changed much since her last visit. She denies pain, but states that they still bother her. She endorses some recent growth of the lesions on her scalp.  Review of Systems:   No fevers No cough No recent illnesses Otherwise as per above  Past Medical History: Past Medical History:  Diagnosis Date  . Abnormal heart rhythm    per patient hx of A-fib  . Allergies   . Arthritis   . Arthritis   . Breast disorder   . Cancer    (CMD)   . Facial trauma   . High cholesterol   . Latex allergy    unsure of allergy per patient    Past Surgical History: Past Surgical History:  Procedure Laterality Date  . BREAST LUMPECTOMY  1998   Procedure: BREAST LUMPECTOMY; right -benign  . BREAST LUMPECTOMY  2008   Procedure: BREAST LUMPECTOMY; left-breast cancer, treated with lumpectomy and XRT  . BREAST SURGERY     Procedure: BREAST SURGERY  . CHOLECYSTECTOMY     Procedure: CHOLECYSTECTOMY  . COSMETIC SURGERY     Procedure: COSMETIC SURGERY; face  . KNEE ARTHROSCOPY     Procedure: KNEE ARTHROSCOPY  . OOPHORECTOMY     Procedure: OOPHORECTOMY  . SKIN LESION EXCISION     Procedure: SKIN LESION EXCISION; multiple cylindroma excisions  . SKIN LESION EXCISION   09/15/2012   Procedure: SKIN LESION EXCISION CHEEK / FACE;  Surgeon: Gwendlyn LELON Alexa, MD;  Location: [facility] OUTPATIENT OR;  Service: Plastics;  Laterality: N/A;  EXC ECCRINE CYLINDROMA NOSE X2, LT TEMPLE    Allergies: Allergies as of 05/30/2024 - Reviewed 05/30/2024  Allergen Reaction Noted  . Apixaban Hives 07/12/2017  . Neomycin-bacitracin-polymyxin Swelling 03/14/2012  . Ibuprofen Rash 05/06/2022  . Tetracycline Rash 03/14/2012    Medications:  Current Outpatient Medications:  .  acetaminophen  (TYLENOL ) 500 mg tablet, Take 500 mg by mouth., Disp: , Rfl:  .  ascorbic acid (VITAMIN C) 1,000 mg tablet, Take 1,000 mg by mouth daily., Disp: , Rfl:  .  aspirin 81 mg EC tablet, Take 81 mg by mouth., Disp: , Rfl:  .  cholecalciferol, vitamin D3, (VITAMIN D3 ORAL), Take by mouth., Disp: , Rfl:  .  cyanocobalamin, vitamin B-12, (VITAMIN B-12 ORAL), Take by mouth., Disp: , Rfl:  .  dilTIAZem  (CARDIZEM ) 30 mg immediate release tablet, TAKE 1 TABLET EVERY 4 HOURS AS NEEDED FOR HEART RATE >100 AS LONG AS TOP BLOOD PRESSURE >100., Disp: , Rfl:  .  fluticasone propionate (FLONASE) 50 mcg/spray nasal spray, 1 spray Once Daily. (Patient not taking: Reported on 01/07/2024), Disp: 1 Inhaler, Rfl: 3 .  magnesium 250 mg tablet, Take by mouth daily., Disp: , Rfl:  .  MULTIVITAMIN ORAL, Take by mouth., Disp: , Rfl:  .  omega-3/dha/epa/fish oil (OMEGA-3 FISH OIL ORAL), Take by mouth., Disp: , Rfl:  .  polyethylene glycol [id number] -5.86 gram solr oral solution, Take 4,000 mL by mouth once., Disp: , Rfl:   Social History: Social History   Socioeconomic History  . Marital status: Widowed    Spouse name: Not on file  . Number of children: Not on file  . Years of education: Not on file  . Highest education level: Not on file  Occupational History  . Not on file  Tobacco Use  . Smoking status: Never    Passive exposure: Never  . Smokeless tobacco: Never  Substance and Sexual Activity  . Alcohol use:  No  . Drug use: No  . Sexual activity: Not on file  Other Topics Concern  . Not on file  Social History Narrative   Pt is married and has been married for almost 50 years  She keeps her 38 month old grand-daughter 4 days a week   Social Drivers of Architectural technologist Insecurity: No Food Insecurity (04/14/2024)   Received from [facility]   Food vital sign   . Within the past 12 months, you worried that your food would run out before you got money to buy more: Never true   . Within the past 12 months, the food you bought just didn't last and you didn't have money to get more: Never true  Transportation Needs: No Transportation Needs (04/14/2024)   Received from [facility] - Transportation   . Lack of Transportation (Medical): No   . Lack of Transportation (Non-Medical): No  Safety: Not At Risk (04/14/2024)   Received from [facility]   Safety   . Within the last year, have you been afraid of your partner or ex-partner?: No   . Within the last year, have you been humiliated or emotionally abused in other ways by your partner or ex-partner?: No   . Within the last year, have you been kicked, hit, slapped, or otherwise physically hurt by your partner or ex-partner?: No   . Within the last year, have you been raped or forced to have any kind of sexual activity by your partner or ex-partner?: No  Living Situation: Unknown (04/14/2024)   Received from [facility] Situation   . In the last 12 months, was there a time when you were not able to pay the mortgage or rent on time?: No   . Number of Times Moved in the Last Year: Not on file   . At any time in the past 12 months, were you homeless or living in a shelter (including now)?: No    Family History: Family History  Problem Relation Name Age of Onset  . Cancer Mother    . Obesity Mother    . Diabetes Maternal Aunt    . Lupus Maternal Aunt    . Diabetes Maternal Uncle    . Lupus Paternal Aunt    . Heart failure  Maternal Grandmother    . Osteoarthritis Neg Hx    . Rheum arthritis Neg Hx    . Asthma Neg Hx    . Breast cancer Neg Hx    . Depression Neg Hx    . Hyperlipidemia Neg Hx    . Hypertension Neg Hx    . Migraines Neg Hx    . Rashes / Skin problems Neg Hx    . Seizures Neg Hx    . Thyroid  disease Neg  Hx    . Stroke Neg Hx    . Clotting disorder Neg Hx    . Anesthesia problems Neg Hx      Negative for problems with anesthesia, surgery, bleeding, or wound healing problems.  Physical Examination:  Vitals:   05/30/24 1032  BP: 138/66  Pulse: 98  Temp: 97.3 F (36.3 C)  TempSrc: Temporal   Patient well-appearing and appropriate Normal respiratory effort Breathing clear, no stridor, no wheezing Extremities warm and well perfused  Face: 3.2 x 2.3 cm cylindroma to the right temple, there is sufficient soft tissue laxity to facilitate closure in this region. Similar sized cylindroma to left temple.  Forehead: multiple smaller lesions at and just in front of hairline Scalp: multiple lesions with one very large lesion over the vertical scalp.  Assessment and Plan  1. Brooke-Spiegler syndrome       Will schedule for excision of the right temple and multiple left-sided hairline lesions under sedation, possible use of rotation flap along hairline for temple reconstruction. Will address large scalp lesion at a later time in the OR given its size and need for reconstruction with local flaps to restore hairbearing to this area. Discussed the risks and possible complications associated with surgery. The patient has opted to proceed.  This document serves as a record of services personally performed by K. Lang MD. It was created on their behalf by Logan CHARLENA everitt Ofelia, a trained medical scribe. The creation of this record is the providers dictation and/or activities during the visit. Tue 05/30/2024  Electronically signed by: Rockey Dallas Lang, MD 05/30/2024 10:05 PM

## 2024-05-30 DIAGNOSIS — D239 Other benign neoplasm of skin, unspecified: Secondary | ICD-10-CM | POA: Diagnosis not present

## 2024-06-23 ENCOUNTER — Other Ambulatory Visit: Payer: PPO

## 2024-06-26 ENCOUNTER — Ambulatory Visit (HOSPITAL_BASED_OUTPATIENT_CLINIC_OR_DEPARTMENT_OTHER)
Admission: RE | Admit: 2024-06-26 | Discharge: 2024-06-26 | Disposition: A | Discharge status: Home / Self Care | Source: Ambulatory Visit | Attending: Family Medicine | Admitting: Family Medicine

## 2024-06-26 DIAGNOSIS — Z780 Asymptomatic menopausal state: Secondary | ICD-10-CM | POA: Diagnosis not present

## 2024-06-26 DIAGNOSIS — M810 Age-related osteoporosis without current pathological fracture: Secondary | ICD-10-CM | POA: Diagnosis not present

## 2024-06-28 ENCOUNTER — Ambulatory Visit: Payer: Self-pay | Admitting: Family Medicine

## 2024-08-28 DIAGNOSIS — D23111 Other benign neoplasm of skin of right upper eyelid, including canthus: Secondary | ICD-10-CM | POA: Diagnosis not present

## 2024-08-28 DIAGNOSIS — D23121 Other benign neoplasm of skin of left upper eyelid, including canthus: Secondary | ICD-10-CM | POA: Diagnosis not present

## 2024-08-28 DIAGNOSIS — D239 Other benign neoplasm of skin, unspecified: Secondary | ICD-10-CM | POA: Diagnosis not present

## 2024-08-28 DIAGNOSIS — D2339 Other benign neoplasm of skin of other parts of face: Secondary | ICD-10-CM | POA: Diagnosis not present

## 2024-09-04 ENCOUNTER — Encounter: Payer: Self-pay | Admitting: Family Medicine

## 2024-09-04 ENCOUNTER — Ambulatory Visit: Payer: Self-pay

## 2024-09-04 ENCOUNTER — Ambulatory Visit (INDEPENDENT_AMBULATORY_CARE_PROVIDER_SITE_OTHER): Admitting: Family Medicine

## 2024-09-04 ENCOUNTER — Telehealth: Payer: Self-pay | Admitting: Family Medicine

## 2024-09-04 VITALS — BP 128/78 | HR 65 | Temp 97.9°F | Ht 62.5 in | Wt 161.0 lb

## 2024-09-04 DIAGNOSIS — M79604 Pain in right leg: Secondary | ICD-10-CM

## 2024-09-04 DIAGNOSIS — I8001 Phlebitis and thrombophlebitis of superficial vessels of right lower extremity: Secondary | ICD-10-CM

## 2024-09-04 NOTE — Progress Notes (Unsigned)
   Acute Office Visit  Subjective:     Patient ID: [name] [name], female    DOB: 04/17/1946, 78 y.o.   MRN: [id number]  Chief Complaint  Patient presents with   Leg Pain    Patient complains of sudden onset of right lateral knee pain x2 days, no known injury    HPI Patient is in today for ***  ROS      Objective:    BP 128/78   Pulse 65   Temp 97.9 F (36.6 C) (Oral)   Ht 5' 2.5 (1.588 m)   Wt 161 lb (73 kg)   SpO2 98%   BMI 28.98 kg/m  {Vitals History (Optional):23777}  Physical Exam  No results found for any visits on 09/04/24.      Assessment & Plan:   Problem List Items Addressed This Visit   None Visit Diagnoses       Thrombophlebitis of superficial veins of right lower extremity    -  Primary     Right leg pain       Relevant Orders   VAS US  UPPER EXTREMITY VENOUS DUPLEX       No orders of the defined types were placed in this encounter.   No follow-ups on file.  Heron [name] Sharper, MD

## 2024-09-04 NOTE — Telephone Encounter (Signed)
 Patient came to desk after her appointment and stated that it is her right leg that is in pain, not her left leg as stated on AVS.

## 2024-09-04 NOTE — Telephone Encounter (Signed)
Appt today at 2:20pm

## 2024-09-04 NOTE — Telephone Encounter (Signed)
 FYI Only or Action Required?: FYI only for provider.  Patient was last seen in primary care on 04/14/2024 by Ozell Heron HERO, MD.  Called Nurse Triage reporting Knee Pain.  Symptoms began several days ago.  Interventions attempted: Ice/heat application.  Symptoms are: unchanged.  Triage Disposition: See Physician Within 24 Hours  Patient/caregiver understands and will follow disposition?: Yes  Copied from CRM [id number]. Topic: Clinical - Red Word Triage >> Sep 04, 2024 12:08 PM Larissa RAMAN wrote: [facility] that prompted transfer to Nurse Triage: Rt knee/leg- pain, warm to touch- surgery 1 week ago Reason for Disposition  Localized pain, redness or hard lump along vein  Answer Assessment - Initial Assessment Questions Scheduled appt with pcp 09/04/24.  Advised ED if symptoms worsen.  1. ONSET: When did the pain start?      Side of knee right, down and behind knee pain; varicose vein; no drainage, redness to surgical sites, x5 Denies pain or warm to touch calf muscle. 2. LOCATION: Where is the pain located?      Right leg, tender to touch, warm and red; been icing; outer side of right knee 3. PAIN: How bad is the pain?    (Scale 1-10; or mild, moderate, severe)     4/10 4. WORK OR EXERCISE: Has there been any recent work or exercise that involved this part of the body?      Week ago surgery 5. CAUSE: What do you think is causing the leg pain?     surgery 6. OTHER SYMPTOMS: Do you have any other symptoms? (e.g., chest pain, back pain, breathing difficulty, swelling, rash, fever, numbness, weakness)     Denies breathing difficulty, fever, chest pain, numbness/ weakness chills  Protocols used: Leg Pain-A-AH

## 2024-09-04 NOTE — Telephone Encounter (Signed)
 I fixed it

## 2024-09-06 ENCOUNTER — Other Ambulatory Visit: Payer: Self-pay | Admitting: Family Medicine

## 2024-09-06 ENCOUNTER — Ambulatory Visit (HOSPITAL_COMMUNITY)
Admission: RE | Admit: 2024-09-06 | Discharge: 2024-09-06 | Disposition: A | Discharge status: Home / Self Care | Source: Ambulatory Visit | Attending: Family Medicine | Admitting: Family Medicine

## 2024-09-06 ENCOUNTER — Other Ambulatory Visit (HOSPITAL_COMMUNITY): Payer: Self-pay

## 2024-09-06 ENCOUNTER — Encounter: Payer: Self-pay | Admitting: Student-PharmD

## 2024-09-06 ENCOUNTER — Ambulatory Visit: Attending: Vascular Surgery | Admitting: Student-PharmD

## 2024-09-06 VITALS — BP 140/84 | HR 67 | Wt 162.8 lb

## 2024-09-06 DIAGNOSIS — M79604 Pain in right leg: Secondary | ICD-10-CM | POA: Insufficient documentation

## 2024-09-06 DIAGNOSIS — I8001 Phlebitis and thrombophlebitis of superficial vessels of right lower extremity: Secondary | ICD-10-CM | POA: Insufficient documentation

## 2024-09-06 DIAGNOSIS — I82451 Acute embolism and thrombosis of right peroneal vein: Secondary | ICD-10-CM | POA: Diagnosis not present

## 2024-09-06 MED ORDER — XARELTO VTE STARTER PACK 15 & 20 MG PO TBPK
ORAL_TABLET | ORAL | 0 refills | Status: DC
  Filled 2024-09-06: qty 51, 30d supply, fill #0

## 2024-09-06 MED ORDER — RIVAROXABAN 20 MG PO TABS: 20.0000 mg | ORAL_TABLET | Freq: Every day | ORAL | 1 refills | Status: DC

## 2024-09-06 NOTE — Patient Instructions (Signed)
-  Start rivaroxaban  (Xarelto ) 15 mg twice daily with food for 21 days followed by 20 mg daily with food. -[facility]. You may need to call the pharmacy to ask them to fill this when you start to run low on your current supply.  -It is important to take your medication around the same time every day.  -Avoid NSAIDs like ibuprofen (Advil, Motrin) and naproxen (Aleve) as well as aspirin doses over 100 mg daily. -Tylenol  (acetaminophen ) is the preferred over the counter pain medication to lower the risk of bleeding. -Be sure to alert all of your health care providers that you are taking an anticoagulant prior to starting a new medication or having a procedure. -Monitor for signs and symptoms of bleeding (abnormal bruising, prolonged bleeding, nose bleeds, bleeding from gums, discolored urine, black tarry stools). If you have fallen and hit your head OR if your bleeding is severe or not stopping, seek emergency care.  -Go to the emergency room if emergent signs and symptoms of new clot occur (new or worse swelling and pain in an arm or leg, shortness of breath, chest pain, fast or irregular heartbeats, lightheadedness, dizziness, fainting, coughing up blood) or if you experience a significant color change (pale or blue) in the extremity that has the DVT.  -We recommend you wear compression stockings (20-30 mmHg) as long as you are having swelling or pain. Be sure to purchase the correct size and take them off at night.   If you have any questions or need to reschedule an appointment, please call [phone]. If you are having an emergency, call 911 or present to the nearest emergency room.   What is a DVT?  -Deep vein thrombosis (DVT) is a condition in which a blood clot forms in a vein of the deep venous system which can occur in the lower leg, thigh, pelvis, arm, or neck. This condition is serious and can be life-threatening if the clot travels to the arteries of the lungs and  causing a blockage (pulmonary embolism, PE). A DVT can also damage veins in the leg, which can lead to long-term venous disease, leg pain, swelling, discoloration, and ulcers or sores (post-thrombotic syndrome).  -Treatment may include taking an anticoagulant medication to prevent more clots from forming and the current clot from growing, wearing compression stockings, and/or surgical procedures to remove or dissolve the clot.

## 2024-09-06 NOTE — Progress Notes (Signed)
 DVT Clinic Note  Name: [name] [name]     MRN: [id number]     DOB: [birth year]     Sex: female  PCP: Ozell Heron [name], MD  Today's Visit: Visit Information: Initial Visit  Referred to DVT Clinic by: Primary Care - Dr. Ozell Referred to CPP by: Dr. Sheree Reason for referral:  Chief Complaint  Patient presents with   DVT   HISTORY OF PRESENT ILLNESS: [name] [name] is a 78 y.o. female with PMH paroxysmal atrial fibrillation. HLD, breast cancer s/p lumpectomy and HRT, who presents after diagnosis of DVT for medication management. Denies prior history of DVT. Does have history of varicose veins. First noticed that her right lower leg was tender, red, and slightly swollen on Friday 09/01/24. Most of the tenderness was around a varicose vein by her knee but then she started to have some tenderness behind her calf and down into her ankle/foot. Denies pain today except with palpation. She is s/p surgery on her scalp to remove benign tumors. She typically walks twice a day but after the procedure has rested more than usual. She was seen by her PCP Monday and suspected thrombophlebitis but wanted to rule out DVT. Ultrasound today showed acute DVT in the right peroneal veins and intramuscular thrombosis in the gastrocnemius vein. Also had superficial thrombophlebitis. She was referred to DVT Clinic to start treatment. Patient reports history of hives with Eliquis. Reports she has taken Xarelto  before and felt sick while on it. Was previously prescribed this for afib but patient has opted to take aspirin instead. Prefers to avoid medications when able. Denies chest pain, SOB.   Positive Thrombotic Risk Factors: Older Age, Other (comment) (recent surgical removal of tumors on scalp, has been less active since then compared to normal physical activity) Bleeding Risk Factors: Age >65 years  Negative Thrombotic Risk Factors: Previous VTE, Recent surgery (within 3 months), Recent trauma (within 3 months), Recent  admission to hospital with acute illness (within 3 months), Paralysis, paresis, or recent plaster cast immobilization of lower extremity, Central venous catheterization, Bed rest >72 hours within 3 months, Sedentary journey lasting >8 hours within 4 weeks, Pregnancy, Within 6 weeks postpartum, Recent cesarean section (within 3 months), Estrogen therapy, Testosterone therapy, Erythropoiesis-stimulating agent, Recent COVID diagnosis (within 3 months), Active cancer, Non-malignant, chronic inflammatory condition, Known thrombophilic condition, Smoking, Obesity  Rx Insurance Coverage: Medicare Rx Affordability: Xarelto  is $47/month.  Rx Assistance Provided: None needed at this time Preferred Pharmacy: Starter pack filled at [facility] on-site, [facility].   Past Medical History:  Diagnosis Date   Anxiety    Arthritis    Atrial fibrillation (HCC)    Benign colon polyp    Cancer (HCC) 2012   breast- left   Complication of anesthesia    Diverticulosis    Dysrhythmia    History of breast cancer    s/p lumpectomy and HRT   Hyperlipidemia    intolerant of all cholesterol meds   Obesity    Palpitations    PONV (postoperative nausea and vomiting)     Past Surgical History:  Procedure Laterality Date   ABDOMINAL HYSTERECTOMY  partial   per pt she still has uterus   BREAST BIOPSY Right 2011   benign   BREAST BIOPSY  10/22/2022   MM RT RADIOACTIVE SEED LOC MAMMO GUIDE 10/22/2022 [facility]MO[facility]AP[facility]   BREAST LUMPECTOMY  07/26/2007   Left lumpectomy+sln,ER+PR-,Her2-,T1bN0   BREAST LUMPECTOMY WITH RADIOACTIVE SEED LOCALIZATION Right 10/23/2022  Procedure: RIGHT BREAST LUMPECTOMY WITH RADIOACTIVE SEED LOCALIZATION;  Surgeon: Curvin Deward MOULD, MD;  Location: [facility];  Service: General;  Laterality: Right;   CHOLECYSTECTOMY     CP : ETT 10/11     Medical History. Walked 630 on [address] protocl. normal ECG   CT RADIATION THERAPY GUIDE     KNEE ARTHROSCOPY      left 2004; right 2014   Left Knee Score     OOPHORECTOMY  2008   scalp surgery  2012   cylindromas- trichoepethliomas   TOTAL ABDOMINAL HYSTERECTOMY W/ BILATERAL SALPINGOOPHORECTOMY      Social History   Socioeconomic History   Marital status: Widowed    Spouse name: Not on file   Number of children: 3   Years of education: Not on file   Highest education level: Not on file  Occupational History   Occupation: Retired Engineer, civil (consulting)  Tobacco Use   Smoking status: Never   Smokeless tobacco: Never   Tobacco comments:    Never smoke 04/09/22  Vaping Use   Vaping status: Never Used  Substance and Sexual Activity   Alcohol use: Not Currently    Alcohol/week: 1.0 standard drink of alcohol    Types: 1 Glasses of wine per week    Comment: occasional   Drug use: No   Sexual activity: Not Currently  Other Topics Concern   Not on file  Social History Narrative   Not on file   Social Drivers of Health   Financial Resource Strain: Low Risk  (04/14/2024)   Overall Financial Resource Strain (CARDIA)    Difficulty of Paying Living Expenses: Not hard at all  Food Insecurity: No Food Insecurity (04/14/2024)   Hunger Vital Sign    Worried About Running Out of Food in the Last Year: Never true    Ran Out of Food in the Last Year: Never true  Transportation Needs: No Transportation Needs (04/14/2024)   PRAPARE - Administrator, Civil Service (Medical): No    Lack of Transportation (Non-Medical): No  Physical Activity: Insufficiently Active (04/14/2024)   Exercise Vital Sign    Days of Exercise per Week: 3 days    Minutes of Exercise per Session: 30 min  Stress: No Stress Concern Present (04/14/2024)   [employer] of Occupational Health - Occupational Stress Questionnaire    Feeling of Stress : Only a little  Social Connections: Moderately Integrated (04/14/2024)   Social Connection and Isolation Panel    Frequency of Communication with Friends and Family: Three times a week     Frequency of Social Gatherings with Friends and Family: Once a week    Attends Religious Services: More than 4 times per year    Active Member of [employer] or Organizations: Yes    Attends Banker Meetings: More than 4 times per year    Marital Status: Widowed  Intimate Partner Violence: Not At Risk (04/14/2024)   Humiliation, Afraid, Rape, and Kick questionnaire    Fear of Current or Ex-Partner: No    Emotionally Abused: No    Physically Abused: No    Sexually Abused: No    Family History  Problem Relation Age of Onset   Cancer Mother        liver   Cancer Father        bone   Cancer Maternal Uncle        colon   Coronary artery disease Neg Hx     Allergies as  of 09/06/2024 - Review Complete 09/06/2024  Allergen Reaction Noted   Naproxen sodium Hives 10/14/2016   Neomycin-bacitracin zn-polymyx Swelling    Tetracycline Rash    Apixaban Hives 07/12/2017   Advil [ibuprofen] Rash 05/06/2022   Other Rash 03/14/2012    Current Outpatient Medications on File Prior to Visit  Medication Sig Dispense Refill   acetaminophen  (TYLENOL ) 500 MG tablet Take 500 mg by mouth every 6 (six) hours as needed for moderate pain.     Ascorbic Acid (VITAMIN C) 1000 MG tablet Take 1,000 mg by mouth daily.     b complex vitamins capsule Take 1 capsule by mouth daily.     Biotin 10000 MCG TABS 1 tablet Orally Once a day     diltiazem  (CARDIZEM ) 30 MG tablet Take 1 tablet every 4 hours AS NEEDED for heart rate >100 as long as top blood pressure >100. 30 tablet 3   magnesium oxide (MAG-OX) 400 (240 Mg) MG tablet Take 400 mg by mouth daily.     Moringa Oleifera (MORINGA PO) Take 1 g by mouth daily.     Multiple Vitamin (MULTIVITAMIN) tablet Take 1 tablet by mouth daily.     OVER THE COUNTER MEDICATION Cholesterol support-nightly     OVER THE COUNTER MEDICATION Energize Electrolytes-1 scoop daily     OVER THE COUNTER MEDICATION Omegas with Turmeric-daily     OVER THE COUNTER MEDICATION  Collagen     Vitamin D -Vitamin K (VITAMIN K2-VITAMIN D3 PO) Take by mouth daily.     triamcinolone  cream (KENALOG ) 0.1 % Apply 1 Application topically 2 (two) times daily. 30 g 0   No current facility-administered medications on file prior to visit.   REVIEW OF SYSTEMS:  Review of Systems  Respiratory:  Negative for shortness of breath.   Cardiovascular:  Negative for chest pain, palpitations and leg swelling.  Musculoskeletal:  Negative for myalgias.  Neurological:  Negative for dizziness and tingling.   PHYSICAL EXAMINATION:  Vitals:   09/06/24 1605  BP: (!) 140/84  Pulse: 67  SpO2: 98%  Weight: 162 lb 12.8 oz (73.8 kg)    Body mass index is 29.3 kg/m.  Physical Exam Vitals reviewed.  Cardiovascular:     Rate and Rhythm: Normal rate.  Pulmonary:     Effort: Pulmonary effort is normal.  Musculoskeletal:        General: No tenderness.     Right lower leg: Edema (very mild) present.  Skin:    Findings: No bruising or erythema.  Psychiatric:        Mood and Affect: Mood normal.        Behavior: Behavior normal.        Thought Content: Thought content normal.   Villalta Score for Post-Thrombotic Syndrome: Pain: Mild Cramps: Absent Heaviness: Absent Paresthesia: Absent Pruritus: Absent Pretibial Edema: Mild Skin Induration: Absent Hyperpigmentation: Absent Redness: Absent Venous Ectasia: Mild Pain on calf compression: Absent Villalta Preliminary Score: 3 Is venous ulcer present?: No If venous ulcer is present and score is <15, then 15 points total are assigned: Absent Villalta Total Score: 3  LABS:  CBC     Component Value Date/Time   WBC 6.6 05/06/2022 1222   RBC 5.08 05/06/2022 1222   HGB 15.4 (H) 05/06/2022 1222   HGB 14.4 01/26/2012 1001   HCT 45.4 05/06/2022 1222   HCT 42.5 01/26/2012 1001   PLT 252 05/06/2022 1222   PLT 229 01/26/2012 1001   MCV 89.4 05/06/2022 1222   MCV 89.9 01/26/2012  1001   MCH 30.3 05/06/2022 1222   MCHC 33.9 05/06/2022  1222   RDW 13.3 05/06/2022 1222   RDW 13.9 01/26/2012 1001   LYMPHSABS 1.6 01/26/2012 1001   MONOABS 0.4 01/26/2012 1001   EOSABS 0.1 01/26/2012 1001   BASOSABS 0.0 01/26/2012 1001    Hepatic Function      Component Value Date/Time   PROT 7.6 04/14/2024 0946   ALBUMIN 4.4 04/14/2024 0946   AST 18 04/14/2024 0946   ALT 15 04/14/2024 0946   ALKPHOS 71 04/14/2024 0946   BILITOT 0.7 04/14/2024 0946    Renal Function   Lab Results  Component Value Date   CREATININE 0.82 04/14/2024   CREATININE 0.76 01/27/2023   CREATININE 0.81 05/06/2022    CrCl cannot be calculated (Patient's most recent lab result is older than the maximum 21 days allowed.).   [facility] Vascular Lab Studies:  09/06/24 VAS US  LOWER EXTREMITY VENOUS (DVT)RIGHT  Summary:  RIGHT:  - Findings consistent with acute deep vein thrombosis involving the right  peroneal veins, from the proximal to mid calf with a vein of the Gastroc  involved.  - Findings consistent with acute superficial vein thrombosis involving the  right varicosities at the medial knee and posterior calf.  - All other veins visualized appear fully compressible and demonstrate  appropriate Doppler characteristics.   ASSESSMENT: Location of DVT: Right distal vein Cause of DVT: provoked by a transient risk factor  Patient without prior history of DVT diagnosed with acute DVT in the right peroneal veins and intramuscular thrombosis of the right gastrocnemius vein. Indicated to start anticoagulation. Discussed options with patient. Will avoid Eliquis due to allergy. Reports history of not tolerating Xarelto  well but would rather try this again than use subQ Lovenox to bridge to warfarin. Agree that this would be appropriate. Renal function wnl. Will start Xarelto  starter pack. Believe this to be a provoked DVT in light of recent acute decline in activity after her recent procedure. For this reason and because the DVT is distal only, will plan to treat for a  total of 3 months. Patient would like to be on anticoagulation for as short a time as possible. Patient will work to increase physical activity back to normal. Counseled patient extensively on Xarelto . Since she is taking aspirin for afib only (not on anticoagulation for this due to patient preference), will have her hold aspirin while on anticoagulation with Xarelto . No barriers related to medication adherence or access identified today. All questions have been answered.   PLAN: -Hold aspirin while on Xarelto .  -Start rivaroxaban  (Xarelto ) 15 mg twice daily with food for 21 days followed by 20 mg daily with food. -Expected duration of therapy: 3 months. Therapy started on 09/06/24. -Patient educated on purpose, proper use and potential adverse effects of rivaroxaban  (Xarelto ). -Discussed importance of taking medication around the same time every day. -Advised patient of medications to avoid (NSAIDs, aspirin doses >100 mg daily). -Educated that Tylenol  (acetaminophen ) is the preferred analgesic to lower the risk of bleeding. -Advised patient to alert all providers of anticoagulation therapy prior to starting a new medication or having a procedure. -Emphasized importance of monitoring for signs and symptoms of bleeding (abnormal bruising, prolonged bleeding, nose bleeds, bleeding from gums, discolored urine, black tarry stools). -Educated patient to present to the ED if emergent signs and symptoms of new thrombosis occur. -Counseled patient to wear compression stockings daily, removing at night. Elevate legs as needed.   Follow up: 3 months in  DVT Clinic for end of treatment visit  Lum Herald, PharmD, JAQUELINE, CPP Deep Vein Thrombosis Clinic Clinical Pharmacist Practitioner [phone]

## 2024-09-07 ENCOUNTER — Ambulatory Visit: Payer: Self-pay | Admitting: Family Medicine

## 2024-09-07 ENCOUNTER — Telehealth: Payer: Self-pay | Admitting: Pharmacist

## 2024-09-07 DIAGNOSIS — I8001 Phlebitis and thrombophlebitis of superficial vessels of right lower extremity: Secondary | ICD-10-CM

## 2024-09-07 DIAGNOSIS — I824Z1 Acute embolism and thrombosis of unspecified deep veins of right distal lower extremity: Secondary | ICD-10-CM

## 2024-09-07 NOTE — Telephone Encounter (Signed)
 Returned patient's call with questions about Xarelto . She asked if it was possible for her to take a lower dose of Xarelto  to treat her DVT. Counseled that she needs to take the prescribed dose of Xarelto  as this is the appropriate dose for treatment of DVT. She confirmed understanding. She also stated she read the package insert and had concerns about bleeding side effects and kidney/liver effects. Counseled on signs and symptoms of bleeding to watch out for and when to let us  and her PCP know. ED precautions provided. All patient questions were answered.

## 2024-09-21 ENCOUNTER — Other Ambulatory Visit: Payer: Self-pay | Admitting: Family Medicine

## 2024-09-21 DIAGNOSIS — Z853 Personal history of malignant neoplasm of breast: Secondary | ICD-10-CM

## 2024-09-21 DIAGNOSIS — H25043 Posterior subcapsular polar age-related cataract, bilateral: Secondary | ICD-10-CM | POA: Diagnosis not present

## 2024-09-21 DIAGNOSIS — H2513 Age-related nuclear cataract, bilateral: Secondary | ICD-10-CM | POA: Diagnosis not present

## 2024-09-26 ENCOUNTER — Encounter (HOSPITAL_BASED_OUTPATIENT_CLINIC_OR_DEPARTMENT_OTHER): Payer: Self-pay

## 2024-10-08 ENCOUNTER — Other Ambulatory Visit: Payer: Self-pay

## 2024-10-08 ENCOUNTER — Emergency Department (HOSPITAL_COMMUNITY)

## 2024-10-08 ENCOUNTER — Inpatient Hospital Stay (HOSPITAL_COMMUNITY)
Admission: EM | Admit: 2024-10-08 | Discharge: 2024-10-10 | DRG: 065 | Disposition: A | Discharge status: Home / Self Care | Attending: Emergency Medicine | Admitting: Emergency Medicine

## 2024-10-08 ENCOUNTER — Encounter (HOSPITAL_COMMUNITY): Payer: Self-pay

## 2024-10-08 DIAGNOSIS — Z886 Allergy status to analgesic agent status: Secondary | ICD-10-CM | POA: Diagnosis not present

## 2024-10-08 DIAGNOSIS — Z7982 Long term (current) use of aspirin: Secondary | ICD-10-CM | POA: Diagnosis not present

## 2024-10-08 DIAGNOSIS — R000 Tachycardia, unspecified: Secondary | ICD-10-CM | POA: Diagnosis not present

## 2024-10-08 DIAGNOSIS — I480 Paroxysmal atrial fibrillation: Secondary | ICD-10-CM | POA: Diagnosis present

## 2024-10-08 DIAGNOSIS — Z860100 Personal history of colon polyps, unspecified: Secondary | ICD-10-CM | POA: Diagnosis not present

## 2024-10-08 DIAGNOSIS — Z90711 Acquired absence of uterus with remaining cervical stump: Secondary | ICD-10-CM | POA: Diagnosis not present

## 2024-10-08 DIAGNOSIS — R29818 Other symptoms and signs involving the nervous system: Secondary | ICD-10-CM | POA: Diagnosis not present

## 2024-10-08 DIAGNOSIS — I639 Cerebral infarction, unspecified: Secondary | ICD-10-CM | POA: Diagnosis not present

## 2024-10-08 DIAGNOSIS — I10 Essential (primary) hypertension: Secondary | ICD-10-CM | POA: Diagnosis present

## 2024-10-08 DIAGNOSIS — Z881 Allergy status to other antibiotic agents status: Secondary | ICD-10-CM

## 2024-10-08 DIAGNOSIS — I4891 Unspecified atrial fibrillation: Secondary | ICD-10-CM | POA: Diagnosis not present

## 2024-10-08 DIAGNOSIS — G8194 Hemiplegia, unspecified affecting left nondominant side: Secondary | ICD-10-CM | POA: Diagnosis present

## 2024-10-08 DIAGNOSIS — Z853 Personal history of malignant neoplasm of breast: Secondary | ICD-10-CM

## 2024-10-08 DIAGNOSIS — Z888 Allergy status to other drugs, medicaments and biological substances status: Secondary | ICD-10-CM | POA: Diagnosis not present

## 2024-10-08 DIAGNOSIS — Z79899 Other long term (current) drug therapy: Secondary | ICD-10-CM

## 2024-10-08 DIAGNOSIS — I6340 Cerebral infarction due to embolism of unspecified cerebral artery: Secondary | ICD-10-CM | POA: Diagnosis not present

## 2024-10-08 DIAGNOSIS — R29700 NIHSS score 0: Secondary | ICD-10-CM | POA: Diagnosis not present

## 2024-10-08 DIAGNOSIS — I6381 Other cerebral infarction due to occlusion or stenosis of small artery: Secondary | ICD-10-CM | POA: Diagnosis not present

## 2024-10-08 DIAGNOSIS — R457 State of emotional shock and stress, unspecified: Secondary | ICD-10-CM | POA: Diagnosis not present

## 2024-10-08 DIAGNOSIS — R4781 Slurred speech: Secondary | ICD-10-CM | POA: Diagnosis not present

## 2024-10-08 DIAGNOSIS — Z7901 Long term (current) use of anticoagulants: Secondary | ICD-10-CM | POA: Diagnosis not present

## 2024-10-08 DIAGNOSIS — E785 Hyperlipidemia, unspecified: Secondary | ICD-10-CM | POA: Diagnosis not present

## 2024-10-08 DIAGNOSIS — I6389 Other cerebral infarction: Secondary | ICD-10-CM | POA: Diagnosis not present

## 2024-10-08 DIAGNOSIS — G459 Transient cerebral ischemic attack, unspecified: Principal | ICD-10-CM

## 2024-10-08 DIAGNOSIS — Z91048 Other nonmedicinal substance allergy status: Secondary | ICD-10-CM | POA: Diagnosis not present

## 2024-10-08 DIAGNOSIS — Z8673 Personal history of transient ischemic attack (TIA), and cerebral infarction without residual deficits: Secondary | ICD-10-CM | POA: Diagnosis present

## 2024-10-08 LAB — DIFFERENTIAL
Abs Immature Granulocytes: 0.02 K/uL (ref 0.00–0.07)
Basophils Absolute: 0.1 K/uL (ref 0.0–0.1)
Basophils Relative: 2 %
Eosinophils Absolute: 0.2 K/uL (ref 0.0–0.5)
Eosinophils Relative: 4 %
Immature Granulocytes: 0 %
Lymphocytes Relative: 26 %
Lymphs Abs: 1.3 K/uL (ref 0.7–4.0)
Monocytes Absolute: 0.5 K/uL (ref 0.1–1.0)
Monocytes Relative: 9 %
Neutro Abs: 3 K/uL (ref 1.7–7.7)
Neutrophils Relative %: 59 %

## 2024-10-08 LAB — URINALYSIS, ROUTINE W REFLEX MICROSCOPIC
Bilirubin Urine: NEGATIVE
Glucose, UA: NEGATIVE mg/dL
Hgb urine dipstick: NEGATIVE
Ketones, ur: 5 mg/dL — AB
Leukocytes,Ua: NEGATIVE
Nitrite: NEGATIVE
Protein, ur: NEGATIVE mg/dL
Specific Gravity, Urine: 1.012 (ref 1.005–1.030)
pH: 7 (ref 5.0–8.0)

## 2024-10-08 LAB — CBC
HCT: 40.4 % (ref 36.0–46.0)
HCT: 41.3 % (ref 36.0–46.0)
Hemoglobin: 13.5 g/dL (ref 12.0–15.0)
Hemoglobin: 13.5 g/dL (ref 12.0–15.0)
MCH: 30.9 pg (ref 26.0–34.0)
MCH: 30.4 pg (ref 26.0–34.0)
MCHC: 33.4 g/dL (ref 30.0–36.0)
MCHC: 32.7 g/dL (ref 30.0–36.0)
MCV: 92.4 fL (ref 80.0–100.0)
MCV: 93 fL (ref 80.0–100.0)
Platelets: 210 K/uL (ref 150–400)
Platelets: 201 K/uL (ref 150–400)
RBC: 4.37 MIL/uL (ref 3.87–5.11)
RBC: 4.44 MIL/uL (ref 3.87–5.11)
RDW: 13.9 % (ref 11.5–15.5)
RDW: 14 % (ref 11.5–15.5)
WBC: 5.1 K/uL (ref 4.0–10.5)
WBC: 6.5 K/uL (ref 4.0–10.5)
nRBC: 0 % (ref 0.0–0.2)
nRBC: 0 % (ref 0.0–0.2)

## 2024-10-08 LAB — RAPID URINE DRUG SCREEN, HOSP PERFORMED
Amphetamines: NOT DETECTED
Barbiturates: NOT DETECTED
Benzodiazepines: NOT DETECTED
Cocaine: NOT DETECTED
Opiates: NOT DETECTED
Tetrahydrocannabinol: NOT DETECTED

## 2024-10-08 LAB — COMPREHENSIVE METABOLIC PANEL WITH GFR
ALT: 36 U/L (ref 0–44)
ALT: 36 U/L (ref 0–44)
AST: 34 U/L (ref 15–41)
AST: 31 U/L (ref 15–41)
Albumin: 3.7 g/dL (ref 3.5–5.0)
Albumin: 3.7 g/dL (ref 3.5–5.0)
Alkaline Phosphatase: 145 U/L — ABNORMAL HIGH (ref 38–126)
Alkaline Phosphatase: 142 U/L — ABNORMAL HIGH (ref 38–126)
Anion gap: 11 (ref 5–15)
Anion gap: 11 (ref 5–15)
BUN: 16 mg/dL (ref 8–23)
BUN: 14 mg/dL (ref 8–23)
CO2: 23 mmol/L (ref 22–32)
CO2: 23 mmol/L (ref 22–32)
Calcium: 9.1 mg/dL (ref 8.9–10.3)
Calcium: 9.2 mg/dL (ref 8.9–10.3)
Chloride: 107 mmol/L (ref 98–111)
Chloride: 106 mmol/L (ref 98–111)
Creatinine, Ser: 0.74 mg/dL (ref 0.44–1.00)
Creatinine, Ser: 0.78 mg/dL (ref 0.44–1.00)
GFR, Estimated: >60 mL/min (ref >60)
GFR, Estimated: >60 mL/min (ref >60)
Glucose, Bld: 111 mg/dL — ABNORMAL HIGH (ref 70–99)
Glucose, Bld: 92 mg/dL (ref 70–99)
Potassium: 3.8 mmol/L (ref 3.5–5.1)
Potassium: 4 mmol/L (ref 3.5–5.1)
Sodium: 141 mmol/L (ref 135–145)
Sodium: 140 mmol/L (ref 135–145)
Total Bilirubin: 1.1 mg/dL (ref 0.0–1.2)
Total Bilirubin: 1.2 mg/dL (ref 0.0–1.2)
Total Protein: 6.7 g/dL (ref 6.5–8.1)
Total Protein: 6.8 g/dL (ref 6.5–8.1)

## 2024-10-08 LAB — CBG MONITORING, ED: Glucose-Capillary: 91 mg/dL (ref 70–99)

## 2024-10-08 LAB — T4, FREE: Free T4: 0.91 ng/dL (ref 0.61–1.12)

## 2024-10-08 LAB — ETHANOL: Alcohol, Ethyl (B): <15 mg/dL (ref <15)

## 2024-10-08 LAB — PROTIME-INR
INR: 1.1 (ref 0.8–1.2)
Prothrombin Time: 15.2 s (ref 11.4–15.2)

## 2024-10-08 LAB — HEMOGLOBIN A1C
Hgb A1c MFr Bld: 5.4 % (ref 4.8–5.6)
Mean Plasma Glucose: 108.28 mg/dL

## 2024-10-08 LAB — APTT: aPTT: 27 s (ref 24–36)

## 2024-10-08 LAB — TSH: TSH: 1.499 u[IU]/mL (ref 0.350–4.500)

## 2024-10-08 MED ORDER — SODIUM CHLORIDE 0.9 % IV SOLN: INTRAVENOUS | Status: DC

## 2024-10-08 MED ORDER — ACETAMINOPHEN 325 MG PO TABS
650.0000 mg | ORAL_TABLET | ORAL | Status: DC | PRN
  Administered 2024-10-08 – 2024-10-10 (×2): 650 mg via ORAL
  Filled 2024-10-08 (×3): qty 2

## 2024-10-08 MED ORDER — ATORVASTATIN CALCIUM 80 MG PO TABS
80.0000 mg | ORAL_TABLET | Freq: Every day | ORAL | Status: DC
  Administered 2024-10-09 – 2024-10-10 (×2): 80 mg via ORAL
  Filled 2024-10-08: qty 1
  Filled 2024-10-08: qty 2

## 2024-10-08 MED ORDER — ASPIRIN 81 MG PO TBEC
81.0000 mg | DELAYED_RELEASE_TABLET | Freq: Once | ORAL | Status: AC
  Administered 2024-10-08: 81 mg via ORAL
  Filled 2024-10-08: qty 1

## 2024-10-08 MED ORDER — STROKE: EARLY STAGES OF RECOVERY BOOK
Freq: Once | Status: DC
  Filled 2024-10-08: qty 1

## 2024-10-08 MED ORDER — HEPARIN SODIUM (PORCINE) 5000 UNIT/ML IJ SOLN
5000.0000 [IU] | Freq: Three times a day (TID) | INTRAMUSCULAR | Status: DC
  Filled 2024-10-08: qty 1

## 2024-10-08 MED ORDER — ACETAMINOPHEN 650 MG RE SUPP: 650.0000 mg | RECTAL | Status: DC | PRN

## 2024-10-08 MED ORDER — ACETAMINOPHEN 160 MG/5ML PO SOLN: 650.0000 mg | ORAL | Status: DC | PRN

## 2024-10-08 NOTE — ED Provider Notes (Signed)
 [place] [facility]ERGENCY DEPARTMENT AT [facility] Provider Note   CSN: [id number] Arrival date & time: 10/08/24  9096     Patient presents with: Possible TIA   [name] [name] is a 78 y.o. female.   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  Pt said she woke up this am normal around 0720.  She developed sx where she felt that her left side was weak and that she could not talk around 0730.  Sx were resolved by the time EMS arrived around 0825.         Prior to Admission medications   Medication Sig Start Date End Date Taking? Authorizing Provider  acetaminophen  (TYLENOL ) 500 MG tablet Take 500 mg by mouth every 6 (six) hours as needed for moderate pain.   Yes [provider]  Ascorbic Acid (VITAMIN C) 1000 MG tablet Take 1,000 mg by mouth daily.   Yes Ozell Heron [name], MD  b complex vitamins capsule Take 1 capsule by mouth every other day.   Yes Ozell Heron [name], MD  diltiazem  (CARDIZEM ) 30 MG tablet Take 1 tablet every 4 hours AS NEEDED for heart rate >100 as long as top blood pressure >100. 04/14/24  Yes Ozell Heron [name], MD  magnesium oxide (MAG-OX) 400 (240 Mg) MG tablet Take 400 mg by mouth daily.   Yes Ozell Heron [name], MD  Moringa Oleifera (MORINGA PO) Take 1 g by mouth daily.   Yes [provider]  Multiple Vitamin (MULTIVITAMIN) tablet Take 1 tablet by mouth daily.   Yes Ozell Heron [name], MD  OVER THE COUNTER MEDICATION Cholesterol support-nightly   Yes [provider]  OVER THE COUNTER MEDICATION Energize Electrolytes-1 scoop daily   Yes [provider]  OVER THE COUNTER MEDICATION Collagen   Yes [provider]  rivaroxaban  (XARELTO ) 20 MG TABS tablet Take 1 tablet (20 mg total) by mouth daily with supper. Take with food. Start after completion of starter pack. 09/06/24  Yes Yates, Madison B, RPH-CPP  triamcinolone  cream (KENALOG ) 0.1 % Apply 1 Application  topically 2 (two) times daily. 03/14/24  Yes Ozell Heron [name], MD  Vitamin D -Vitamin K (VITAMIN K2-VITAMIN D3 PO) Take 1 tablet by mouth daily.   Yes [provider]  OVER THE COUNTER MEDICATION Omegas with Turmeric-daily    [provider]    Allergies: Naproxen sodium, Neomycin-bacitracin zn-polymyx, Tetracycline, Apixaban, Advil [ibuprofen], and Other    Review of Systems  Neurological:  Positive for speech difficulty and weakness.  All other systems reviewed and are negative.   Updated Vital Signs BP 139/76   Pulse 72   Temp 97.9 F (36.6 C)   Resp 17   SpO2 99%   Physical Exam Vitals and nursing note reviewed.  Constitutional:      Appearance: Normal appearance.  HENT:     Head: Normocephalic and atraumatic.     Right Ear: External ear normal.     Left Ear: External ear normal.     Nose: Nose normal.     Mouth/Throat:     Mouth: Mucous membranes are moist.     Pharynx: Oropharynx is clear.  Eyes:     Extraocular Movements: Extraocular movements intact.     Conjunctiva/sclera: Conjunctivae normal.     Pupils: Pupils are equal, round, and reactive to light.  Cardiovascular:     Rate and Rhythm: Normal rate and regular rhythm.  Pulses: Normal pulses.     Heart sounds: Normal heart sounds.  Pulmonary:     Effort: Pulmonary effort is normal.     Breath sounds: Normal breath sounds.  Abdominal:     General: Abdomen is flat. Bowel sounds are normal.     Palpations: Abdomen is soft.  Musculoskeletal:        General: Normal range of motion.     Cervical back: Normal range of motion and neck supple.  Skin:    General: Skin is warm.     Capillary Refill: Capillary refill takes less than 2 seconds.  Neurological:     General: No focal deficit present.     Mental Status: She is alert and oriented to person, place, and time.  Psychiatric:        Mood and Affect: Mood normal.        Behavior: Behavior normal.     (all labs ordered are listed,  but only abnormal results are displayed) Labs Reviewed  COMPREHENSIVE METABOLIC PANEL WITH GFR - Abnormal; Notable for the following components:      Result Value   Glucose, Bld 111 (*)    Alkaline Phosphatase 145 (*)    All other components within normal limits  URINALYSIS, ROUTINE W REFLEX MICROSCOPIC - Abnormal; Notable for the following components:   Ketones, ur 5 (*)    All other components within normal limits  PROTIME-INR  APTT  CBC  DIFFERENTIAL  ETHANOL  RAPID URINE DRUG SCREEN, HOSP PERFORMED  CBG MONITORING, ED    EKG: EKG Interpretation Date/Time:  Sunday October 08 2024 09:18:27 EST Ventricular Rate:  68 PR Interval:  157 QRS Duration:  92 QT Interval:  421 QTC Calculation: 448 R Axis:   50  Text Interpretation: Sinus rhythm Low voltage, precordial leads Confirmed by Dean Clarity [phone]) on 10/08/2024 9:24:41 AM  Radiology: CT HEAD WO CONTRAST Result Date: 10/08/2024 EXAM: CT HEAD WITHOUT CONTRAST 10/08/2024 12:45:28 PM TECHNIQUE: CT of the head was performed without the administration of intravenous contrast. Automated exposure control, iterative reconstruction, and/or weight based adjustment of the mA/kV was utilized to reduce the radiation dose to as low as reasonably achievable. COMPARISON: CT of the head dated 04/14/2013. CLINICAL HISTORY: Neuro deficit, acute, stroke suspected. FINDINGS: BRAIN AND VENTRICLES: No acute hemorrhage. No evidence of acute infarct. No hydrocephalus. No extra-axial collection. No mass effect or midline shift. ORBITS: No acute abnormality. SINUSES: There is a polypoid mucosal density within the right maxillary sinus. SOFT TISSUES AND SKULL: No acute soft tissue abnormality. No skull fracture. IMPRESSION: 1. No acute intracranial abnormality. 2. Polypoid mucosal density within the right maxillary sinus. Electronically signed by: Evalene Coho MD 10/08/2024 12:50 PM EST RP Workstation: [id number]     Procedures   Medications  Ordered in the ED - No data to display                                  Medical Decision Making Amount and/or Complexity of Data Reviewed Labs: ordered. Radiology: ordered.  Risk Decision regarding hospitalization.   This patient presents to the ED for concern of weakness, this involves an extensive number of treatment options, and is a complaint that carries with it a high risk of complications and morbidity.  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  The pertinent results include:  cbc nl, cmp nl, inr nl at 1.1; etoh <15; ua neg; uds neg   Imaging Studies ordered:  I ordered imaging studies including ct head/MRI brain I independently visualized and interpreted imaging which showed  CT head: No acute intracranial abnormality.  2. Polypoid mucosal density within the right maxillary sinus.   I agree with the radiologist interpretation   Cardiac Monitoring:  The patient was maintained on a cardiac monitor.  I personally viewed and interpreted the cardiac monitored which showed an underlying rhythm of: nsr   Medicines ordered and prescription drug management:   I have reviewed the patients home medicines and have made adjustments as needed   Test Considered:  mri  Consultations Obtained:  I requested consultation with the hospitalist (Dr. CHARLENA Blanch),  and discussed lab and imaging findings as well as pertinent plan -she will admit and requests neurology consult.   Problem List / ED Course:  TIA:  pt is on Xarelto .     Reevaluation:  After the interventions noted  above, I reevaluated the patient and found that they have :improved   Social Determinants of Health:  Lives at home   Dispostion:  After consideration of the diagnostic results and the patients response to treatment, I feel that the patent would benefit from admission.       Final diagnoses:  TIA (transient ischemic attack)    ED Discharge Orders     None          Dean Clarity, MD 10/08/24 1711

## 2024-10-08 NOTE — ED Notes (Signed)
 THE PT HAS BEEN BACK FROM MRI  SHE REMAINS ALERT SHE WANTS SOMETHING TO EAT OR DRINK

## 2024-10-08 NOTE — ED Triage Notes (Signed)
 Patient BIB EMS from home c/o left sided weakness, tingling, and slurred speech. Patient woke up at 0720 and symptoms started at 0730. Symptoms resolved by the time EMS arrived at 0825.

## 2024-10-08 NOTE — H&P (Signed)
 History and Physical    Patient: [name] [name] DOB: 1946-01-04 DOA: 10/08/2024 DOS: the patient was seen and examined on 10/08/2024 . PCP: Ozell Heron [name], MD  Patient coming from: Home Chief complaint: Chief Complaint  Patient presents with   Possible TIA   HPI:  [name] [name] is a 78 y.o. female with past medical history  of essential hypertension, comes for not feeling well with left-sided weakness.  Patient states last night she had flashing lights in her right eye that resolved and then she had flashing lights in the left eye and she went about her day.  Today morning when she woke up she was feeling fine and then she realized when going to the bathroom that her left leg felt extremely heavy and her left side was very weak.  She did make it back to bed with no falls palpitations chest pain and realized that she was not feeling better infection was feeling worse and he decided to call 911.  ED Course:  Vital signs in the ED were notable for the following:  Vitals:   10/08/24 1530 10/08/24 1532 10/08/24 1545 10/08/24 1715  BP: 139/76 139/76 117/69 138/63  Pulse: 67 72 68 60  Temp:  97.9 F (36.6 C)    Resp: 16 17 (!) 22 14  SpO2: 98% 99% 98% 100%   >>ED evaluation thus far shows: EKG shows sinus rhythm 68 PR 157 QTc 448 QRS 92 no ST-T wave changes normal axis. - CMP shows glucose of 111 alk phos 145 otherwise normal limits. - CBC within normal limits. -Urinalysis is within normal limits.   - Head CT noncontrast is negative for any acute findings shows a polypoid mucosal density in the right maxillary sinus. -Abnormal MRI with multiple scattered acute infarcts involving bilateral cerebral hemisphere and posterior inferior right cerebellum infarcts covering multiple vascular territories suggestive of an embolic source.  >>While in the ED patient received the following: Medications - No data to display ROS Past Medical History:  Diagnosis Date   Anxiety     Arthritis    Atrial fibrillation (HCC)    Benign colon polyp    Cancer (HCC) 2012   breast- left   Complication of anesthesia    Diverticulosis    Dysrhythmia    History of breast cancer    s/p lumpectomy and HRT   Hyperlipidemia    intolerant of all cholesterol meds   Obesity    Palpitations    PONV (postoperative nausea and vomiting)    Past Surgical History:  Procedure Laterality Date   ABDOMINAL HYSTERECTOMY  partial   per pt she still has uterus   BREAST BIOPSY Right 2011   benign   BREAST BIOPSY  10/22/2022   MM RT RADIOACTIVE SEED LOC MAMMO GUIDE 10/22/2022 [facility]   BREAST LUMPECTOMY  07/26/2007   Left lumpectomy+sln,ER+PR-,Her2-,T1bN0   BREAST LUMPECTOMY WITH RADIOACTIVE SEED LOCALIZATION Right 10/23/2022   Procedure: RIGHT BREAST LUMPECTOMY WITH RADIOACTIVE SEED LOCALIZATION;  Surgeon: Curvin Deward MOULD, MD;  Location: [facility];  Service: General;  Laterality: Right;   CHOLECYSTECTOMY     CP : ETT 10/11     Medical History. Walked 630 on [place] protocl. normal ECG   CT RADIATION THERAPY GUIDE     KNEE ARTHROSCOPY     left 2004; right 2014   Left Knee Score     OOPHORECTOMY  2008   scalp surgery  2012   cylindromas- trichoepethliomas   TOTAL ABDOMINAL  HYSTERECTOMY W/ BILATERAL SALPINGOOPHORECTOMY      reports that she has never smoked. She has never used smokeless tobacco. She reports that she does not currently use alcohol after a past usage of about 1.0 standard drink of alcohol per week. She reports that she does not use drugs. Allergies  Allergen Reactions   Naproxen Sodium Hives   Neomycin-Bacitracin Zn-Polymyx Swelling    In ear, where it was applied.    Tetracycline Rash    All over the body   Apixaban Hives   Advil [Ibuprofen] Rash   Other Rash    ALL TAPES-RASH (PT HAS LEAST RXN TO PAPER TAPE)   Family History  Problem Relation Age of Onset   Cancer Mother        liver   Cancer Father        bone   Cancer Maternal  Uncle        colon   Coronary artery disease Neg Hx    Prior to Admission medications   Medication Sig Start Date End Date Taking? Authorizing Provider  acetaminophen  (TYLENOL ) 500 MG tablet Take 500 mg by mouth every 6 (six) hours as needed for moderate pain.   Yes [provider]  Ascorbic Acid (VITAMIN C) 1000 MG tablet Take 1,000 mg by mouth daily.   Yes Ozell Heron HERO, MD  b complex vitamins capsule Take 1 capsule by mouth every other day.   Yes Ozell Heron HERO, MD  diltiazem  (CARDIZEM ) 30 MG tablet Take 1 tablet every 4 hours AS NEEDED for heart rate >100 as long as top blood pressure >100. 04/14/24  Yes Ozell Heron HERO, MD  magnesium oxide (MAG-OX) 400 (240 Mg) MG tablet Take 400 mg by mouth daily.   Yes Ozell Heron HERO, MD  Moringa Oleifera (MORINGA PO) Take 1 g by mouth daily.   Yes [provider]  Multiple Vitamin (MULTIVITAMIN) tablet Take 1 tablet by mouth daily.   Yes Ozell Heron HERO, MD  OVER THE COUNTER MEDICATION Cholesterol support-nightly   Yes [provider]  OVER THE COUNTER MEDICATION Energize Electrolytes-1 scoop daily   Yes [provider]  OVER THE COUNTER MEDICATION Collagen   Yes [provider]  rivaroxaban  (XARELTO ) 20 MG TABS tablet Take 1 tablet (20 mg total) by mouth daily with supper. Take with food. Start after completion of starter pack. 09/06/24  Yes Yates, Madison B, RPH-CPP  triamcinolone  cream (KENALOG ) 0.1 % Apply 1 Application topically 2 (two) times daily. 03/14/24  Yes Ozell Heron HERO, MD  Vitamin D -Vitamin K (VITAMIN K2-VITAMIN D3 PO) Take 1 tablet by mouth daily.   Yes [provider]  OVER THE COUNTER MEDICATION [place] with Turmeric-daily    [provider]                                                                                 Vitals:   10/08/24 1530 10/08/24 1532 10/08/24 1545 10/08/24 1715  BP: 139/76 139/76 117/69 138/63  Pulse: 67 72 68 60  Resp: 16 17  (!) 22 14  Temp:  97.9 F (36.6 C)    SpO2: 98% 99% 98% 100%   Physical Exam Vitals  and nursing note reviewed.  Constitutional:      General: She is not in acute distress. HENT:     Head: Normocephalic and atraumatic.     Right Ear: Hearing normal.     Left Ear: Hearing normal.     Nose: No nasal deformity.     Mouth/Throat:     Lips: Pink.  Eyes:     General: Lids are normal.     Extraocular Movements: Extraocular movements intact.  Cardiovascular:     Rate and Rhythm: Normal rate and regular rhythm.     Heart sounds: Normal heart sounds.  Pulmonary:     Effort: Pulmonary effort is normal.     Breath sounds: Normal breath sounds.  Abdominal:     General: Bowel sounds are normal. There is no distension.     Palpations: Abdomen is soft. There is no mass.     Tenderness: There is no abdominal tenderness.  Musculoskeletal:     Right lower leg: No edema.     Left lower leg: No edema.  Skin:    General: Skin is warm.  Neurological:     General: No focal deficit present.     Mental Status: She is alert and oriented to person, place, and time.     Cranial Nerves: Cranial nerves 2-12 are intact. No cranial nerve deficit, dysarthria or facial asymmetry.     Sensory: No sensory deficit.     Motor: Motor function is intact. No weakness, tremor or pronator drift.     Coordination: Finger-Nose-Finger Test and Heel to [place] Test normal.     Deep Tendon Reflexes:     Reflex Scores:      Bicep reflexes are 2+ on the right side and 2+ on the left side.      Patellar reflexes are 2+ on the right side and 2+ on the left side. Psychiatric:        Mood and Affect: Mood normal.        Speech: Speech normal.        Behavior: Behavior normal.     Labs on Admission: I have personally reviewed following labs and imaging studies CBC: Recent Labs  Lab 10/08/24 0911  WBC 5.1  NEUTROABS 3.0  HGB 13.5  HCT 40.4  MCV 92.4  PLT 210   Basic Metabolic Panel: Recent Labs  Lab  10/08/24 0911  NA 141  K 3.8  CL 107  CO2 23  GLUCOSE 111*  BUN 16  CREATININE 0.74  CALCIUM 9.1   GFR: CrCl cannot be calculated (Unknown ideal weight.). Liver Function Tests: Recent Labs  Lab 10/08/24 0911  AST 34  ALT 36  ALKPHOS 145*  BILITOT 1.1  PROT 6.7  ALBUMIN 3.7   No results for input(s): LIPASE, AMYLASE in the last 168 hours. No results for input(s): AMMONIA in the last 168 hours. Recent Labs    04/14/24 0946 10/08/24 0911  BUN 18 16  CREATININE 0.82 0.74    Cardiac Enzymes: No results for input(s): CKTOTAL, CKMB, CKMBINDEX, TROPONINI in the last 168 hours. BNP (last 3 results) No results for input(s): PROBNP in the last 8760 hours. HbA1C: No results for input(s): HGBA1C in the last 72 hours. CBG: Recent Labs  Lab 10/08/24 1402  GLUCAP 91   Lipid Profile: No results for input(s): CHOL, HDL, LDLCALC, TRIG, CHOLHDL, LDLDIRECT in the last 72 hours. Thyroid  Function Tests: No results for input(s): TSH, T4TOTAL, FREET4, T3FREE, THYROIDAB in the last 72 hours. Anemia  Panel: No results for input(s): VITAMINB12, FOLATE, FERRITIN, TIBC, IRON, RETICCTPCT in the last 72 hours. Urine analysis:    Component Value Date/Time   COLORURINE YELLOW 10/08/2024 1459   APPEARANCEUR CLEAR 10/08/2024 1459   LABSPEC 1.012 10/08/2024 1459   PHURINE 7.0 10/08/2024 1459   GLUCOSEU NEGATIVE 10/08/2024 1459   HGBUR NEGATIVE 10/08/2024 1459   BILIRUBINUR NEGATIVE 10/08/2024 1459   KETONESUR 5 (A) 10/08/2024 1459   PROTEINUR NEGATIVE 10/08/2024 1459   NITRITE NEGATIVE 10/08/2024 1459   LEUKOCYTESUR NEGATIVE 10/08/2024 1459   Radiological Exams on Admission: MR BRAIN WO CONTRAST Result Date: 10/08/2024 EXAM: MRI BRAIN WITHOUT CONTRAST 10/08/2024 04:21:15 PM TECHNIQUE: Multiplanar multisequence MRI of the head/brain was performed without the administration of intravenous contrast. COMPARISON: None available. CLINICAL  HISTORY: Neuro deficit, acute, stroke suspected. Left-sided weakness and paresthesias. Slurred speech. Patient awoke with these symptoms today. FINDINGS: BRAIN AND VENTRICLES: Diffusion weighted images demonstrate multiple small scattered acute infarcts. The largest right-sided infarct is in the posterior right middle frontal gyrus measuring 7 mm on image 59 of series 7. The largest left-sided infarct is in the left parietal lobe measuring 7 mm on image 85 of series 5. Bilateral lacunar infarcts are present in the posterior inferior cerebellum . A linear infarct in the posterior inferior right cerebellum extends 17 mm. T2 and FLAIR hyperintensities are associated with the areas of acute/subacute infarction. No other significant white matter disease is present. No intracranial hemorrhage. No mass. No midline shift. No hydrocephalus. The sella is unremarkable. Normal flow voids. ORBITS: No acute abnormality. SINUSES AND MASTOIDS: No acute abnormality. BONES AND SOFT TISSUES: Normal marrow signal. Multiple raised skin lesions are present throughout the scalp. These lesions may represent neurofibromas or multiple sebaceous cysts. IMPRESSION: 1. Multiple small scattered acute infarcts involving bilateral cerebral hemispheres and posterior inferior right cerebellum, largest lesions measuring 7 mm in the posterior right middle frontal gyrus and left parietal lobe, and a 17 mm linear infarct in the posterior inferior right cerebellum. The infarcts cover multiple vascular territories suggesting a embolic central source. Electronically signed by: Lonni Necessary MD 10/08/2024 05:12 PM EST RP Workstation: [id number]   CT HEAD WO CONTRAST Result Date: 10/08/2024 EXAM: CT HEAD WITHOUT CONTRAST 10/08/2024 12:45:28 PM TECHNIQUE: CT of the head was performed without the administration of intravenous contrast. Automated exposure control, iterative reconstruction, and/or weight based adjustment of the mA/kV was utilized to  reduce the radiation dose to as low as reasonably achievable. COMPARISON: CT of the head dated 04/14/2013. CLINICAL HISTORY: Neuro deficit, acute, stroke suspected. FINDINGS: BRAIN AND VENTRICLES: No acute hemorrhage. No evidence of acute infarct. No hydrocephalus. No extra-axial collection. No mass effect or midline shift. ORBITS: No acute abnormality. SINUSES: There is a polypoid mucosal density within the right maxillary sinus. SOFT TISSUES AND SKULL: No acute soft tissue abnormality. No skull fracture. IMPRESSION: 1. No acute intracranial abnormality. 2. Polypoid mucosal density within the right maxillary sinus. Electronically signed by: Evalene Coho MD 10/08/2024 12:50 PM EST RP Workstation: [id number]   Data Reviewed: Relevant notes from primary care and specialist visits, past discharge summaries as available in EHR, including Care Everywhere . Prior diagnostic testing as pertinent to current admission diagnoses, Updated medications and problem lists for reconciliation .ED course, including vitals, labs, imaging, treatment and response to treatment,Triage notes, nursing and pharmacy notes and ED provider's notes.Notable results as noted in HPI.Discussed case with EDMD/ ED APP/ or Specialty MD on call and as needed.  Assessment & Plan    >>  Acute CVA: - Admit to telemetry. - Stroke service consulted. - tPA not given because out of window. - CT head shows no acute findings. - MRI brain shows extensive bilateral cerebral hemisphere acute CVA please see complete report. - Aspirin daily 81 mg. - Check FLP, hemoglobin A1c.  Patient has a history of LDLs over 200 send has not been on statin therapy advised patient to consider and started patient on aspirin 81 along with atorvastatin 80 mg today. - Check 2 D Echocardiogram with bubble study for PFO evaluation.  And carotid dopplers. - RN to perform stroke swallowing screen and if patient passes, place diet order. - Neuro checks Q 2 hours x 12  hours, then Q 4 hours. - PT/OT evaluations - Atrial fibrillation not present -Xarelto  held currently - Normal saline started for 1 day. -TFTs A1c lipid panel,speech and PT and OT consult.   >> Hyperlipidemia: - lipid panel added on. - Lipitor 80 mg given to patient.-  >> Paroxysmal atrial fibrillation: -Currently in sinus rhythm. CHA2DS2/VAS Stroke Risk Points  Current as of 6 minutes ago     5 >= 2 Points: High Risk  1 to 1.99 Points: Medium Risk  Will defer to discharging team to discharge patient on antiplatelet and/or anticoagulation regimen for her A-fib after discussing with neurology.   DVT prophylaxis:  Heparin Q8 Consults:  Neurology  Advance Care Planning:    Code Status: Full Code   Family Communication:  None Disposition Plan:  Home Severity of Illness: The appropriate patient status for this patient is INPATIENT. Inpatient status is judged to be reasonable and necessary in order to provide the required intensity of service to ensure the patient's safety. The patient's presenting symptoms, physical exam findings, and initial radiographic and laboratory data in the context of their chronic comorbidities is felt to place them at high risk for further clinical deterioration. Furthermore, it is not anticipated that the patient will be medically stable for discharge from the hospital within 2 midnights of admission.   * I certify that at the point of admission it is my clinical judgment that the patient will require inpatient hospital care spanning beyond 2 midnights from the point of admission due to high intensity of service, high risk for further deterioration and high frequency of surveillance required.*  Unresulted Labs (From admission, onward)     Start     Ordered   10/09/24 0500  Lipid panel  (Labs)  Tomorrow morning,   R       Comments: Fasting    10/08/24 1753   10/08/24 1754  TSH  Add-on,   AD        10/08/24 1753   10/08/24 1754  T4, free  Add-on,   AD         10/08/24 1753   10/08/24 1754  Hemoglobin A1c  Add-on,   AD        10/08/24 1753   10/08/24 1753  Comprehensive metabolic panel  (Labs)  Once,   R        10/08/24 1753   10/08/24 1753  CBC  (Labs)  Once,   R        10/08/24 1753   10/08/24 1751  Urine rapid drug screen (hosp performed)not at [facility])  Once,   R        10/08/24 1753            Meds ordered this encounter  Medications   aspirin EC tablet 81  mg   atorvastatin (LIPITOR) tablet 80 mg    stroke: early stages of recovery book   0.9 %  sodium chloride infusion   OR Linked Order Group    acetaminophen  (TYLENOL ) tablet 650 mg    acetaminophen  (TYLENOL ) 160 MG/5ML solution 650 mg    acetaminophen  (TYLENOL ) suppository 650 mg   heparin injection 5,000 Units     Orders Placed This Encounter  Procedures   Critical Care   CT HEAD WO CONTRAST   MR BRAIN WO CONTRAST   Protime-INR   APTT   CBC   Differential   Comprehensive metabolic panel   Ethanol   Urine rapid drug screen (hosp performed)   Urinalysis, Routine w reflex microscopic -Urine, Clean Catch   Urine rapid drug screen (hosp performed)not at [facility]   Lipid panel   Comprehensive metabolic panel   CBC   TSH   T4, free   Hemoglobin A1c   Diet NPO time specified   Vital signs q 2 hours x 12 hours, then q 4 hours   ED Cardiac monitoring   NIH Stroke Scale   Swallow screen   Nurse notify provider if SBP > 220/120   If O2 sat <94% Administer O2 @ 2 Liters/Minute   Initiate Carrier Fluid Protocol   NIHSS score documentation NIHSS score range: 0-42   Vital signs   Notify physician (specify)   OOB with assistance   Activity as tolerated   Swallow screen - If patient does NOT pass this screen, place order for SLP eval and treat (SLP2) - swallowing evaluation (BSE, MBS and/or diet order as indicated)   NIH Stroke Scale   Intake and output   Cardiac Monitoring Continuous x 24 hours Indications for use: Acute neurological event   Apply Stroke Care  Plan: Ischemic Stroke, TIA   Discuss with patient and document patient's goals for stroke risk factor reduction   Initiate Oral Care Protocol   Initiate Carrier Fluid Protocol   Provide stroke education material to patient and family.   Nurse to provide smoking / tobacco cessation education   If the patient has passed the Stroke Swallow Screen or has a feeding tube, then RN may order General Admission PRN Orders (through manage orders) for the following patient needs: allergy symptoms (Claritin), cold sores (Carmex), cough (Robitussin DM), eye irritation (Liquifilm Tears), hemorrhoids (Tucks), indigestion (Maalox), minor skin irritation (hydrocortisone cream), muscle pain Lucienne Gay), nose irritation (saline nasal spray) and sore throat (Chloraseptic spray).   Full code   Consult to hospitalist   Consult to Registered Dietitian   Consult to Transition of Care Team   OT eval and treat   PT eval and treat   ED Pulse oximetry, continuous   Oxygen therapy Mode or (Route): Nasal cannula; Liters Per Minute: 2; Keep O2 saturation between: greater than 94 %   SLP eval and treat Reason for evaluation: Cognitive/Language evaluation   CBG monitoring, ED   ED EKG   EKG 12-Lead   EKG 12-Lead   ECHOCARDIOGRAM COMPLETE   Saline lock IV   Admit to Inpatient (patient's expected length of stay will be greater than 2 midnights or inpatient only procedure)   Fall precautions   Aspiration precautions    Author: Mario LULLA Blanch, MD 12 pm- 8 pm. [facility]. 10/08/2024 6:16 PM Please note for any communication after hours contact [facility] Assigned provider on call on Amion.

## 2024-10-08 NOTE — ED Provider Notes (Signed)
 5:05 PM Patient signed out to me by previous ED physician. Pt presenting with TIA-left sided weakness at 7AM that approved prior to arrival to ED.    [facility] stable MRI pending  P: call neuro after mri, update Dr. CHARLENA Blanch admitting  Physical Exam  BP 139/76   Pulse 72   Temp 97.9 F (36.6 C)   Resp 17   SpO2 99%   Physical Exam  Procedures  .Critical Care  Performed by: Elnor Bernarda SQUIBB, DO Authorized by: Elnor Bernarda SQUIBB, DO   Critical care provider statement:    Critical care time (minutes):  70   Critical care was necessary to treat or prevent imminent or life-threatening deterioration of the following conditions: acute stroke.   Critical care was time spent personally by me on the following activities:  Development of treatment plan with patient or surrogate, discussions with consultants, evaluation of patient's response to treatment, examination of patient, ordering and review of laboratory studies, ordering and review of radiographic studies, ordering and performing treatments and interventions, pulse oximetry, re-evaluation of patient's condition and review of old charts   Care discussed with comment:  Neuro and admitting   ED Course / MDM    Medical Decision Making Amount and/or Complexity of Data Reviewed Labs: ordered. Radiology: ordered.  Risk OTC drugs. Decision regarding hospitalization.   MRI demonstrates:  1. Multiple small scattered acute infarcts involving bilateral cerebral hemispheres and posterior inferior right cerebellum, largest lesions measuring 7 mm in the posterior right middle frontal gyrus and left parietal lobe, and a 17 mm linear infarct in the posterior inferior right cerebellum. The infarcts cover multiple vascular territories suggesting a embolic central source.   Dr. Michaela with neuro recs admit and daily asa. Hospitalist Dr. Blanch accepting and up to date on plan.       Elnor Bernarda SQUIBB, DO 10/08/24 1733

## 2024-10-08 NOTE — ED Notes (Signed)
 Pt ambulated to restroom with steady gait.

## 2024-10-08 NOTE — ED Notes (Signed)
 THE PT PASSED HER SWALLOW SCREEN WITH NO DIFFICULTY

## 2024-10-09 ENCOUNTER — Inpatient Hospital Stay (HOSPITAL_COMMUNITY)

## 2024-10-09 DIAGNOSIS — Z7901 Long term (current) use of anticoagulants: Secondary | ICD-10-CM

## 2024-10-09 DIAGNOSIS — I6389 Other cerebral infarction: Secondary | ICD-10-CM | POA: Diagnosis not present

## 2024-10-09 DIAGNOSIS — I6340 Cerebral infarction due to embolism of unspecified cerebral artery: Secondary | ICD-10-CM

## 2024-10-09 DIAGNOSIS — I4891 Unspecified atrial fibrillation: Secondary | ICD-10-CM | POA: Diagnosis not present

## 2024-10-09 DIAGNOSIS — I639 Cerebral infarction, unspecified: Secondary | ICD-10-CM | POA: Diagnosis not present

## 2024-10-09 DIAGNOSIS — R29700 NIHSS score 0: Secondary | ICD-10-CM | POA: Diagnosis not present

## 2024-10-09 DIAGNOSIS — R29818 Other symptoms and signs involving the nervous system: Secondary | ICD-10-CM | POA: Diagnosis not present

## 2024-10-09 DIAGNOSIS — E785 Hyperlipidemia, unspecified: Secondary | ICD-10-CM

## 2024-10-09 LAB — ECHOCARDIOGRAM COMPLETE
Area-P 1/2: 2.73 cm2
Height: 64 in
MV M vel: 3.65 m/s
MV Peak grad: 53.3 mmHg
MV VTI: 1.88 cm2
S' Lateral: 2.7 cm
Single Plane A4C EF: 69.8 %
Weight: 2603.19 [oz_av]

## 2024-10-09 LAB — GLUCOSE, CAPILLARY: Glucose-Capillary: 111 mg/dL — ABNORMAL HIGH (ref 70–99)

## 2024-10-09 LAB — LIPID PANEL
Cholesterol: 287 mg/dL — ABNORMAL HIGH (ref 0–200)
HDL: 88 mg/dL (ref >40)
LDL Cholesterol: 183 mg/dL — ABNORMAL HIGH (ref 0–99)
Total CHOL/HDL Ratio: 3.3 ratio
Triglycerides: 80 mg/dL (ref <150)
VLDL: 16 mg/dL (ref 0–40)

## 2024-10-09 MED ORDER — RIVAROXABAN 10 MG PO TABS
20.0000 mg | ORAL_TABLET | Freq: Every day | ORAL | Status: DC
  Filled 2024-10-09: qty 2

## 2024-10-09 MED ORDER — ADULT MULTIVITAMIN W/MINERALS CH
1.0000 | ORAL_TABLET | Freq: Every day | ORAL | Status: DC
  Administered 2024-10-09 – 2024-10-10 (×2): 1 via ORAL
  Filled 2024-10-09 (×2): qty 1

## 2024-10-09 MED ORDER — MAGNESIUM OXIDE -MG SUPPLEMENT 400 (240 MG) MG PO TABS
400.0000 mg | ORAL_TABLET | Freq: Every day | ORAL | Status: DC
  Administered 2024-10-09 – 2024-10-10 (×2): 400 mg via ORAL
  Filled 2024-10-09 (×2): qty 1

## 2024-10-09 MED ORDER — VITAMIN B-12 100 MCG PO TABS
100.0000 ug | ORAL_TABLET | ORAL | Status: DC
  Administered 2024-10-09: 100 ug via ORAL
  Filled 2024-10-09: qty 1

## 2024-10-09 MED ORDER — RIVAROXABAN 20 MG PO TABS
20.0000 mg | ORAL_TABLET | Freq: Every day | ORAL | Status: DC
  Administered 2024-10-09: 20 mg via ORAL

## 2024-10-09 MED ORDER — VITAMIN C 500 MG PO TABS
1000.0000 mg | ORAL_TABLET | Freq: Every day | ORAL | Status: DC
  Administered 2024-10-09 – 2024-10-10 (×2): 1000 mg via ORAL
  Filled 2024-10-09 (×2): qty 2

## 2024-10-09 MED ORDER — ATORVASTATIN CALCIUM 80 MG PO TABS: 80.0000 mg | ORAL_TABLET | Freq: Every day | ORAL | 2 refills | Status: DC

## 2024-10-09 NOTE — ED Notes (Signed)
 Patient assisted to restroom. Patient ambulated to restroom with steady gait.

## 2024-10-09 NOTE — Evaluation (Addendum)
 Occupational Therapy Evaluation Patient Details Name: [name] [name] MRN: [id number] DOB: December 22, 1945 Today's Date: 10/09/2024   History of Present Illness   Patient is a 78 yo female presenting to the ED with L sided weakness, tingling, slurred speech on 10/08/24. CT clear, MRI finding  multiple scattered acute infarcts involving bilateral cerebral hemisphere and posterior inferior right cerebellum infarcts covering multiple vascular territories suggestive of an embolic source. 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 Currently, patient with no residual symptoms, and back to her baseline from an ADL, visual, cognitive, and functional mobility perspective. Patient and daughter educated on BE FAST acronym, and all questions answered. OT to sign off at this time, please re-consult if further needs arise.   BP 155/80 (102) HR 69-72 BPM     If plan is discharge home, recommend the following:   Supervision due to cognitive status;Direct supervision/assist for medications management;Direct supervision/assist for financial management (initially for utmost safety)     Functional Status Assessment   Patient has not had a recent decline in their functional status     Equipment Recommendations   None recommended by OT     Recommendations for Other Services         Precautions/Restrictions   Precautions Precautions: Fall Recall of Precautions/Restrictions: Intact Restrictions Weight Bearing Restrictions Per Provider Order: No     Mobility Bed Mobility Overal bed mobility: Modified Independent                  Transfers Overall transfer level: Modified independent                        Balance Overall balance assessment: Mild deficits observed, not formally tested                                          ADL either performed or assessed with clinical judgement   ADL Overall ADL's : At baseline                                       General ADL Comments: Prior to this admission, patient living alone, with daughter close by, driving and fully independent. Currently, patient with no residual symptoms, and back to her baseline from an ADL, visual, cognitive, and functional mobility perspective. Patient and daughter educated on BE FAST acronym, and all questions answered. OT to sign off at this time, please re-consult if further needs arise.     Vision Baseline Vision/History: 1 Wears glasses Ability to See in Adequate Light: 0 Adequate Patient Visual Report: No change from baseline Vision Assessment?: Yes Eye Alignment: Within Functional Limits Ocular Range of Motion: Within Functional Limits Alignment/Gaze Preference: Within Defined Limits Tracking/Visual Pursuits: Able to track stimulus in all quads without difficulty Saccades: Within functional limits Convergence: Within functional limits Visual Fields: No apparent deficits Additional Comments: visual deficits have resolved, explains it as a flashing light or wheel looking image in her eye that subsides after a fewminutes     Perception Perception: Within Functional Limits       Praxis Praxis: [facility]  Pertinent Vitals/Pain Pain Assessment Pain Assessment: No/denies pain     Extremity/Trunk Assessment Upper Extremity Assessment Upper Extremity Assessment: Right hand dominant;Overall [facility] for tasks assessed   Lower Extremity Assessment Lower Extremity Assessment: Defer to PT evaluation   Cervical / Trunk Assessment Cervical / Trunk Assessment: Normal   Communication Communication Communication: No apparent difficulties   Cognition Arousal: Alert Behavior During Therapy: WFL for tasks assessed/performed Cognition: No apparent impairments                                Following commands: Intact       Cueing  General Comments      VSS   Exercises     Shoulder Instructions      Home Living Family/patient expects to be discharged to:: Private residence Living Arrangements: Alone Available Help at Discharge: Family;Available 24 hours/day Type of Home: Apartment Home Access: Level entry     Home Layout: One level     Bathroom Shower/Tub: Chief Strategy Officer: Standard     Home Equipment: Grab bars - tub/shower   Additional Comments: likes to read, play games, go shopping, to lunch with friends/family      Prior Functioning/Environment Prior Level of Function : Independent/Modified Independent;Driving             Mobility Comments: independent ADLs Comments: independent, likes to go out with friends and go shoppping    OT Problem List:     OT Treatment/Interventions:        OT Goals(Current goals can be found in the care plan section)   Acute Rehab OT Goals Patient Stated Goal: to get all the answers before going home OT Goal Formulation: With patient/family Time For Goal Achievement: 10/23/24 Potential to Achieve Goals: Good   OT Frequency:       Co-evaluation              AM-PAC OT 6 Clicks Daily Activity     Outcome Measure Help from another person eating meals?: None Help from another person taking care of personal grooming?: None Help from another person toileting, which includes using toliet, bedpan, or urinal?: None Help from another person bathing (including washing, rinsing, drying)?: None Help from another person to put on and taking off regular upper body clothing?: None Help from another person to put on and taking off regular lower body clothing?: None 6 Click Score: 24   End of Session Nurse Communication: Mobility status  Activity Tolerance: Patient tolerated treatment well Patient left: in bed;with call bell/phone within reach;with family/visitor  present  OT Visit Diagnosis: Other abnormalities of gait and mobility (R26.89)                Time: 8948-8884 OT Time Calculation (min): 24 min Charges:  OT General Charges $OT Visit: 1 Visit OT Evaluation $OT Eval Moderate Complexity: 1 Mod OT Treatments $Self Care/Home Management : 8-22 mins  Ronal Gift E. Genia Perin, OTR/L Acute Rehabilitation Services [phone]   Ronal Gift Salt 10/09/2024, 2:41 PM

## 2024-10-09 NOTE — TOC CM/SW Note (Addendum)
 TOC consult received. Unknown needs at this time. PT sent message via Epic chat for recommendations r/t HH or DME. Awaiting response.   Merilee Batty, MSN, RN Case Management [phone]: Patient converted to inpt. PT note recommends skilled PT in the acute setting and from outpatient neurorehabilitation at d/c. Per note, patient ambulated 180' with contact guard assist. Discharge plan is likely for outpt therapy when medically appropriate.

## 2024-10-09 NOTE — Progress Notes (Signed)
 PROGRESS NOTE  [name] [name] FMW:[id number] DOB: [birth year] DOA: 10/08/2024 PCP: Ozell [name] [name] [name], MD   LOS: 1 day   Brief narrative:  [name] [name] is a 78 y.o. female with past medical history  of essential hypertension, atrial fibrillation and DVT on Xarelto  with compliance today, hyperlipidemia intolerant of statins presented to hospital with left-sided weakness, heaviness of the left extremities with flashes in her eyes.  In the ED, patient had stable vitals.  CT head scan was negative but MRI showed embolic appearing infarcts.  Patient was then considered for admission to the hospital for further evaluation and treatment.      Assessment/Plan: Principal Problem:   Acute CVA (cerebrovascular accident) (HCC)  Acute CVA: Continue telemetry monitoring.  Neurology has seen the patient.  Orthopedic candidate.  CT head without any acute findings but MRI of the brain showed extensive bilateral cerebral CVA.  LDL of 183.  Hemoglobin A1c of 5.4.  Will continue with aspirin Lipitor.  MRI angiogram of the head did not show any significant intracranial stenosis.  On permissive hypertension.  Neurology has recommended to continue home Xarelto .  Check 2D echocardiogram, carotid duplex PT OT.  Hyperlipidemia: LDL of 183.  Continue Lipitor 80 milligram daily   Paroxysmal atrial fibrillation: Currently in normal sinus rhythm. CHA2DS2/VAS    5 >= 2 Points: High Risk.  On Xarelto  at home.  Will continue as per neurology  DVT prophylaxis: rivaroxaban  (XARELTO ) tablet 20 mg Start: 10/09/24 1000 rivaroxaban  (XARELTO ) tablet 20 mg   Disposition: Home  Status is: Inpatient Remains inpatient appropriate because: Acute stroke    Code Status:     Code Status: Full Code  Family Communication: None at bedside  Consultants: Neurology  Procedures: None  Anti-infectives:  None  Anti-infectives (From admission, onward)    None        Subjective: Today, patient was seen and examined at  bedside.  Patient denies any dizziness lightheadedness nausea vomiting fever chills.  Objective: Vitals:   10/09/24 1100 10/09/24 1200  BP: (!) 155/80 (!) 147/74  Pulse: 60 65  Resp: 14 16  Temp:    SpO2: 99% 100%    Intake/Output Summary (Last 24 hours) at 10/09/2024 1333 Last data filed at 10/08/2024 1751 Gross per 24 hour  Intake 0 ml  Output --  Net 0 ml   Filed Weights   10/08/24 2334 10/09/24 0033 10/09/24 0414  Weight: 73.8 kg 73.8 kg 73.8 kg   Body mass index is 27.93 kg/m.   Physical Exam: GENERAL: Patient is alert awake and oriented. Not in obvious distress. HENT: No scleral pallor or icterus. Pupils equally reactive to light. Oral mucosa is moist NECK: is supple, no gross swelling noted. CHEST: Clear to auscultation. No crackles or wheezes.   CVS: S1 and S2 heard, no murmur. Regular rate and rhythm.  ABDOMEN: Soft, non-tender, bowel sounds are present. EXTREMITIES: No edema. CNS: Cranial nerves are intact. No focal motor deficits. SKIN: warm and dry without rashes.  Data Review: I have personally reviewed the following laboratory data and studies,  CBC: Recent Labs  Lab 10/08/24 0911 10/08/24 1858  WBC 5.1 6.5  NEUTROABS 3.0  --   HGB 13.5 13.5  HCT 40.4 41.3  MCV 92.4 93.0  PLT 210 201   Basic Metabolic Panel: Recent Labs  Lab 10/08/24 0911 10/08/24 1858  NA 141 140  K 3.8 4.0  CL 107 106  CO2 23 23  GLUCOSE 111* 92  BUN 16 14  CREATININE 0.74 0.78  CALCIUM 9.1 9.2   Liver Function Tests: Recent Labs  Lab 10/08/24 0911 10/08/24 1858  AST 34 31  ALT 36 36  ALKPHOS 145* 142*  BILITOT 1.1 1.2  PROT 6.7 6.8  ALBUMIN 3.7 3.7   No results for input(s): LIPASE, AMYLASE in the last 168 hours. No results for input(s): AMMONIA in the last 168 hours. Cardiac Enzymes: No results for input(s): CKTOTAL, CKMB, CKMBINDEX, TROPONINI in the last 168 hours. BNP (last 3 results) No results for input(s): BNP in the last 8760  hours.  ProBNP (last 3 results) No results for input(s): PROBNP in the last 8760 hours.  CBG: Recent Labs  Lab 10/08/24 1402  GLUCAP 91   No results found for this or any previous visit (from the past 240 hours).   Studies: MR ANGIO HEAD WO CONTRAST Result Date: 10/09/2024 EXAM: MR Angiography Head without intravenous contrast. 10/09/2024 03:39:00 AM TECHNIQUE: Magnetic resonance angiography images of the head without intravenous contrast. Multiplanar 2D and 3D reformatted images are provided for review. COMPARISON: None provided. CLINICAL HISTORY: Neuro deficit, acute, stroke suspected Neuro deficit, acute, stroke suspected FINDINGS: ANTERIOR CIRCULATION: No significant stenosis of the internal carotid arteries. No significant stenosis of the anterior cerebral arteries. No significant stenosis of the middle cerebral arteries. No aneurysm. POSTERIOR CIRCULATION: No significant stenosis of the posterior cerebral arteries. Right fetal type PCA. No significant stenosis of the basilar artery. No significant stenosis of the vertebral arteries. No aneurysm. IMPRESSION: 1. No significant stenosis of the intracranial vasculature. Electronically signed by: Gilmore Molt MD 10/09/2024 03:50 AM EST RP Workstation: [id number]   MR BRAIN WO CONTRAST Result Date: 10/08/2024 EXAM: MRI BRAIN WITHOUT CONTRAST 10/08/2024 04:21:15 PM TECHNIQUE: Multiplanar multisequence MRI of the head/brain was performed without the administration of intravenous contrast. COMPARISON: None available. CLINICAL HISTORY: Neuro deficit, acute, stroke suspected. Left-sided weakness and paresthesias. Slurred speech. Patient awoke with these symptoms today. FINDINGS: BRAIN AND VENTRICLES: Diffusion weighted images demonstrate multiple small scattered acute infarcts. The largest right-sided infarct is in the posterior right middle frontal gyrus measuring 7 mm on image 59 of series 7. The largest left-sided infarct is in the left  parietal lobe measuring 7 mm on image 85 of series 5. Bilateral lacunar infarcts are present in the posterior inferior cerebellum . A linear infarct in the posterior inferior right cerebellum extends 17 mm. T2 and FLAIR hyperintensities are associated with the areas of acute/subacute infarction. No other significant white matter disease is present. No intracranial hemorrhage. No mass. No midline shift. No hydrocephalus. The sella is unremarkable. Normal flow voids. ORBITS: No acute abnormality. SINUSES AND MASTOIDS: No acute abnormality. BONES AND SOFT TISSUES: Normal marrow signal. Multiple raised skin lesions are present throughout the scalp. These lesions may represent neurofibromas or multiple sebaceous cysts. IMPRESSION: 1. Multiple small scattered acute infarcts involving bilateral cerebral hemispheres and posterior inferior right cerebellum, largest lesions measuring 7 mm in the posterior right middle frontal gyrus and left parietal lobe, and a 17 mm linear infarct in the posterior inferior right cerebellum. The infarcts cover multiple vascular territories suggesting a embolic central source. Electronically signed by: Lonni Necessary MD 10/08/2024 05:12 PM EST RP Workstation: [id number]   CT HEAD WO CONTRAST Result Date: 10/08/2024 EXAM: CT HEAD WITHOUT CONTRAST 10/08/2024 12:45:28 PM TECHNIQUE: CT of the head was performed without the administration of intravenous contrast. Automated exposure control, iterative reconstruction, and/or weight based adjustment of the mA/kV was utilized to reduce the radiation dose to as low  as reasonably achievable. COMPARISON: CT of the head dated 04/14/2013. CLINICAL HISTORY: Neuro deficit, acute, stroke suspected. FINDINGS: BRAIN AND VENTRICLES: No acute hemorrhage. No evidence of acute infarct. No hydrocephalus. No extra-axial collection. No mass effect or midline shift. ORBITS: No acute abnormality. SINUSES: There is a polypoid mucosal density within the right  maxillary sinus. SOFT TISSUES AND SKULL: No acute soft tissue abnormality. No skull fracture. IMPRESSION: 1. No acute intracranial abnormality. 2. Polypoid mucosal density within the right maxillary sinus. Electronically signed by: Evalene Coho MD 10/08/2024 12:50 PM EST RP Workstation: [id number]      Vernal Alstrom, MD  [facility] 10/09/2024  If 7PM-7AM, please contact night-coverage

## 2024-10-09 NOTE — ED Notes (Signed)
 At 0045, primary RN entered patient room to assess vitals. Patient informed primary RN that about an hour prior she experienced a flashing light sensation in her R eye Patient stated that she was told by the doctor that she should have come in immediately when she experienced that and patient is concerned. Primary RN assessed patient and noted her to be at baseline. Pt denies any visual changes, numbness, tingling, or strength deficits in any extremity. Patient endorses a mild headache but says it is nothing more than she has had over the last couple of days. NIH 0. Speech is clear, face is symmetric, and pupils are 3BR. At this time, patient neuro status is WDL. Primary RN notified provider. No new orders at this time.

## 2024-10-09 NOTE — Consult Note (Addendum)
 NEUROLOGY CONSULT NOTE   Date of service: October 09, 2024 Patient Name: [name] [name] MRN:  [id number] DOB:  07-17-1946 Chief Complaint: Embolic appearing infarcts, episode of left leg weakness Requesting Provider: Tobie Mario GAILS, MD  History of Present Illness  [name] [name] is a 78 y.o. female with hx of atrial fibrillation and DVT on Xarelto  and has been compliant with it, hyperlipidemia and intolerant of statins in the past due to muscle aches and weakness who woke up on 10/08/2024 at 7:20 AM and was at her baseline.  She walked over to the bathroom at 7:30 AM, and noted that her left leg was clumsy and was very heavy.  Her left arm also felt the same.  She got back into the bed and this went on for about 30 minutes.  At 1 point, she felt that this worsened and she was tingly in her leg.  The symptoms resolved but she came into the ED for further evaluation and workup.  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  Patient reports that she was started on Xarelto  around middle of the October and took a starter pack and then is currently on Xarelto  20 mg daily and is compliant with it.  She reports that her symptoms have significantly improved and pretty much resolved at this time.  She denies any prior history of strokes.  No family history of strokes that she can recall.  She does not smoke, does not use marijuana or use any recreational substances.  She denies any history of diabetes, hypertension.  She endorses hyperlipidemia.  She reports taking a supplement for her hyperlipidemia.  LKW: 0720 on 10/08/2024 Modified rankin score: 0-Completely asymptomatic and back to baseline post- stroke IV Thrombolysis: Not offered, too mild to treat.   EVT: Not offered, low suspicion for an LVO.    NIHSS components Score: Comment  1a Level of Conscious 0[]  1[]  2[]  3[]      1b LOC Questions 0[]  1[]  2[]       1c LOC Commands 0[]  1[]  2[]       2 Best Gaze 0[]  1[]  2[]       3 Visual  0[]  1[]  2[]  3[]      4 Facial Palsy 0[]  1[]  2[]  3[]      5a Motor Arm - left 0[]  1[]  2[]  3[]  4[]  UN[]    5b Motor Arm - Right 0[]  1[]  2[]  3[]  4[]  UN[]    6a Motor Leg - Left 0[]  1[]  2[]  3[]  4[]  UN[]    6b Motor Leg - Right 0[]  1[]  2[]  3[]  4[]  UN[]    7 Limb Ataxia 0[]  1[]  2[]  UN[]      8 Sensory 0[]  1[]  2[]  UN[]      9 Best Language 0[]  1[]  2[]  3[]      10 Dysarthria 0[]  1[]  2[]  UN[]      11 Extinct. and Inattention 0[]  1[]  2[]       TOTAL: 0      ROS  Comprehensive ROS performed and pertinent positives documented in HPI   Past History   Past Medical History:  Diagnosis Date   Anxiety    Arthritis    Atrial fibrillation (HCC)    Benign colon polyp    Cancer (HCC) 2012   breast- left   Complication of anesthesia    Diverticulosis    Dysrhythmia    History of breast cancer    s/p lumpectomy and HRT   Hyperlipidemia    intolerant of all cholesterol meds   Obesity  Palpitations    PONV (postoperative nausea and vomiting)     Past Surgical History:  Procedure Laterality Date   ABDOMINAL HYSTERECTOMY  partial   per pt she still has uterus   BREAST BIOPSY Right 2011   benign   BREAST BIOPSY  10/22/2022   MM RT RADIOACTIVE SEED LOC MAMMO GUIDE 10/22/2022 [facility]   BREAST LUMPECTOMY  07/26/2007   Left lumpectomy+sln,ER+PR-,Her2-,T1bN0   BREAST LUMPECTOMY WITH RADIOACTIVE SEED LOCALIZATION Right 10/23/2022   Procedure: RIGHT BREAST LUMPECTOMY WITH RADIOACTIVE SEED LOCALIZATION;  Surgeon: Curvin Deward MOULD, MD;  Location: [facility];  Service: General;  Laterality: Right;   CHOLECYSTECTOMY     CP : ETT 10/11     Medical History. Walked 630 on [place] protocl. normal ECG   CT RADIATION THERAPY GUIDE     KNEE ARTHROSCOPY     left 2004; right 2014   Left Knee Score     OOPHORECTOMY  2008   scalp surgery  2012   cylindromas- trichoepethliomas   TOTAL ABDOMINAL HYSTERECTOMY W/ BILATERAL SALPINGOOPHORECTOMY      Family History: Family History  Problem  Relation Age of Onset   Cancer Mother        liver   Cancer Father        bone   Cancer Maternal Uncle        colon   Coronary artery disease Neg Hx     Social History  reports that she has never smoked. She has never used smokeless tobacco. She reports that she does not currently use alcohol after a past usage of about 1.0 standard drink of alcohol per week. She reports that she does not use drugs.  Allergies  Allergen Reactions   Naproxen Sodium Hives   Neomycin-Bacitracin Zn-Polymyx Swelling    In ear, where it was applied.    Tetracycline Rash    All over the body   Apixaban Hives   Advil [Ibuprofen] Rash   Other Rash    ALL TAPES-RASH (PT HAS LEAST RXN TO PAPER TAPE)    Medications   Current Facility-Administered Medications:     stroke: early stages of recovery book, , Does not apply, Once, Tobie Mario GAILS, MD   0.9 %  sodium chloride infusion, , Intravenous, Continuous, Tobie Mario GAILS, MD, Last Rate: 75 mL/hr at 10/08/24 1920, New Bag at 10/08/24 1920   acetaminophen  (TYLENOL ) tablet 650 mg, 650 mg, Oral, Q4H PRN, 650 mg at 10/08/24 2213 **OR** acetaminophen  (TYLENOL ) 160 MG/5ML solution 650 mg, 650 mg, Per Tube, Q4H PRN **OR** acetaminophen  (TYLENOL ) suppository 650 mg, 650 mg, Rectal, Q4H PRN, Tobie Mario GAILS, MD   atorvastatin (LIPITOR) tablet 80 mg, 80 mg, Oral, Daily, Tobie, Ekta V, MD   heparin injection 5,000 Units, 5,000 Units, Subcutaneous, Q8H, Tobie Mario GAILS, MD  Current Outpatient Medications:    acetaminophen  (TYLENOL ) 500 MG tablet, Take 500 mg by mouth every 6 (six) hours as needed for moderate pain., Disp: , Rfl:    Ascorbic Acid (VITAMIN C) 1000 MG tablet, Take 1,000 mg by mouth daily., Disp: , Rfl:    b complex vitamins capsule, Take 1 capsule by mouth every other day., Disp: , Rfl:    diltiazem  (CARDIZEM ) 30 MG tablet, Take 1 tablet every 4 hours AS NEEDED for heart rate >100 as long as top blood pressure >100., Disp: 30 tablet, Rfl: 3   magnesium oxide  (MAG-OX) 400 (240 Mg) MG tablet, Take 400 mg by mouth daily., Disp: ,  Rfl:    Moringa Oleifera (MORINGA PO), Take 1 g by mouth daily., Disp: , Rfl:    Multiple Vitamin (MULTIVITAMIN) tablet, Take 1 tablet by mouth daily., Disp: , Rfl:    OVER THE COUNTER MEDICATION, Cholesterol support-nightly, Disp: , Rfl:    OVER THE COUNTER MEDICATION, Energize Electrolytes-1 scoop daily, Disp: , Rfl:    OVER THE COUNTER MEDICATION, Collagen, Disp: , Rfl:    rivaroxaban  (XARELTO ) 20 MG TABS tablet, Take 1 tablet (20 mg total) by mouth daily with supper. Take with food. Start after completion of starter pack., Disp: 30 tablet, Rfl: 1   triamcinolone  cream (KENALOG ) 0.1 %, Apply 1 Application topically 2 (two) times daily., Disp: 30 g, Rfl: 0   Vitamin D -Vitamin K (VITAMIN K2-VITAMIN D3 PO), Take 1 tablet by mouth daily., Disp: , Rfl:    OVER THE COUNTER MEDICATION, Omegas with Turmeric-daily, Disp: , Rfl:   Vitals   Vitals:   10/08/24 2334 10/09/24 0033 10/09/24 0100 10/09/24 0203  BP:  137/65 (!) 128/51 (!) 152/83  Pulse:  65 65 73  Resp:  18 (!) 21 15  Temp:  97.8 F (36.6 C)    TempSrc:  Oral    SpO2:  96% 95% 99%  Weight: 73.8 kg 73.8 kg    Height: 5' 4 (1.626 m) 5' 4 (1.626 m)      Body mass index is 27.93 kg/m.   Physical Exam   General: Laying comfortably in bed; in no acute distress.  HENT: Normal oropharynx and mucosa. Normal external appearance of ears and nose.  Neck: Supple, no pain or tenderness  CV: No JVD. No peripheral edema.  Pulmonary: Symmetric Chest rise. Normal respiratory effort.  Abdomen: Soft to touch, non-tender.  Ext: No cyanosis, edema, or deformity  Skin: No rash. Normal palpation of skin.   Musculoskeletal: Normal digits and nails by inspection. No clubbing.   Neurologic Examination  Mental status/Cognition: Alert, oriented to self, place, month and year, good attention.  Speech/language: Fluent, comprehension intact, object naming intact, repetition intact.   Cranial nerves:   CN II Pupils equal and reactive to light, no VF deficits    CN III,IV,VI EOM intact, no gaze preference or deviation, no nystagmus    CN V normal sensation in V1, V2, and V3 segments bilaterally    CN VII no asymmetry, no nasolabial fold flattening    CN VIII normal hearing to speech    CN IX & X normal palatal elevation, no uvular deviation    CN XI 5/5 head turn and 5/5 shoulder shrug bilaterally    CN XII midline tongue protrusion    Motor:  Muscle bulk: Normal, tone normal Mvmt Root Nerve  Muscle Right Left Comments  SA C5/6 Ax Deltoid 5 5   EF C5/6 Mc Biceps 5 5   EE C6/7/8 Rad Triceps 5 5   WF C6/7 Med FCR     WE C7/8 PIN ECU     F Ab C8/T1 U ADM/FDI 5 5   HF L1/2/3 Fem Illopsoas 5 5   KE L2/3/4 Fem Quad 5 5   DF L4/5 D Peron Tib Ant 5 5   PF S1/2 Tibial Grc/Sol 5 5    Sensation:  Light touch Intact throughout   Pin prick    Temperature    Vibration   Proprioception    Coordination/Complex Motor:  - Finger to Nose intact bilaterally - Heel to shin intact bilaterally - Rapid alternating movement are slightly slowed in  left upper extremity. - Gait: Deferred. Labs/Imaging/Neurodiagnostic studies   CBC:  Recent Labs  Lab Oct 29, 2024 0911 2024-10-29 1858  WBC 5.1 6.5  NEUTROABS 3.0  --   HGB 13.5 13.5  HCT 40.4 41.3  MCV 92.4 93.0  PLT 210 201   Basic Metabolic Panel:  Lab Results  Component Value Date   NA 140 10-29-2024   K 4.0 2024/10/29   CO2 23 29-Oct-2024   GLUCOSE 92 10/29/24   BUN 14 10-29-2024   CREATININE 0.78 29-Oct-2024   CALCIUM 9.2 Oct 29, 2024   GFRNONAA >60 2024/10/29   GFRAA  07/20/2007    >60        The eGFR has been calculated using the MDRD equation. This calculation has not been validated in all clinical   Lipid Panel:  Lab Results  Component Value Date   LDLCALC 183 (H) 10/09/2024   HgbA1c:  Lab Results  Component Value Date   HGBA1C 5.4 2024-10-29   Urine Drug Screen:     Component Value Date/Time    LABOPIA NONE DETECTED 2024/10/29 1459   COCAINSCRNUR NONE DETECTED 29-Oct-2024 1459   LABBENZ NONE DETECTED October 29, 2024 1459   AMPHETMU NONE DETECTED 2024-10-29 1459   THCU NONE DETECTED October 29, 2024 1459   LABBARB NONE DETECTED 2024/10/29 1459    Alcohol Level     Component Value Date/Time   ETH <15 10-29-2024 0911   INR  Lab Results  Component Value Date   INR 1.1 10-29-24   APTT  Lab Results  Component Value Date   APTT 27 2024/10/29   AED levels: No results found for: PHENYTOIN, ZONISAMIDE, LAMOTRIGINE, LEVETIRACETA  CT Head without contrast(Personally reviewed): CTH was negative for a large hypodensity concerning for a large territory infarct or hyperdensity concerning for an ICH   MR Angio head without contrast and Carotid Duplex BL(Personally reviewed): Pending  MRI Brain(Personally reviewed): 1. Multiple small scattered acute infarcts involving bilateral cerebral hemispheres and posterior inferior right cerebellum, largest lesions measuring 7 mm in the posterior right middle frontal gyrus and left parietal lobe, and a 17 mm linear infarct in the posterior inferior right cerebellum. The infarcts cover multiple vascular territories suggesting a embolic central source.  ASSESSMENT   [name] [name] is a 78 y.o. female presenting with about 30 minutes episode of left leg and left arm weakness and some tingliness.  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  She has a history of atrial fibrillation and DVT and is on Xarelto  and compliant.  Etiology of her embolic infarct is unclear at this time and is pending full workup.  RECOMMENDATIONS  - Frequent Neuro checks per stroke unit protocol - Recommend Vascular imaging with MRA Angio Head without contrast and US  Carotid doppler.  Patient declined contrast for CTA. - Recommend obtaining TTE  - Recommend obtaining Lipid panel with LDL - Please start statin if LDL > 70 - Recommend HbA1c to evaluate for diabetes and  how well it is controlled. - Continue home Xarelto . - SBP goal - permissive hypertension first 24 h < 220/110. Held home meds.  - Recommend Telemetry monitoring for arrythmia - Recommend bedside swallow screen prior to PO intake. - Stroke education booklet - Recommend PT/OT/SLP consult - Recommend Urine Tox screen.  ______________________________________________________________________  Plan discussed with patient at the bedside.  I personally spent a total of 60 minutes in the care of the patient today including preparing to see the patient, getting/reviewing separately obtained history, performing a medically appropriate exam/evaluation, counseling and educating, placing orders,  documenting clinical information in the EHR, independently interpreting results, communicating results, and coordinating care.   Signed, Weda Baumgarner, MD [facility]

## 2024-10-09 NOTE — ED Notes (Signed)
 Patient reports sudden onset of numbness to the left side of her face. Dr Sonjia, [facility] contacted to report new symptoms.

## 2024-10-09 NOTE — Progress Notes (Signed)
  Echocardiogram 2D Echocardiogram has been performed.  [name] [name], RDCS 10/09/2024, 1:32 PM

## 2024-10-09 NOTE — Discharge Summary (Signed)
 Physician Discharge Summary  [name] [name] DOB: June 02, 1946 DOA: 10/08/2024  PCP: [name] Heron [name], MD  Admit date: 10/08/2024 Discharge date: 10/11/2024  Admitted From: Home  Discharge disposition: home   Recommendations for Outpatient Follow-Up:   Follow up with your primary care provider in one week.  Check CBC, BMP, magnesium in the next visit Follow-up with [facility] as outpatient in 2 months.  Discharge Diagnosis:   Principal Problem:   Acute CVA (cerebrovascular accident) [facility])   Discharge Condition: Improved.  Diet recommendation: Low sodium, heart healthy.  Wound care: None.  Code status: Full.   History of Present Illness:   [name] [name] is a 78 y.o. female with past medical history  of essential hypertension, atrial fibrillation and DVT on Xarelto  with compliance today, hyperlipidemia intolerant of statins presented to hospital with left-sided weakness, heaviness of the left extremities with flashes in her eyes.  In the ED, patient had stable vitals.  CT head scan was negative but MRI showed embolic appearing infarcts.  Patient was then considered for admission to the hospital for further evaluation and treatment.   Hospital Course:   Following conditions were addressed during hospitalization as listed below,  Acute CVA:  Neurology saw the patient during hospitalization CT head without any acute findings but MRI of the brain showed extensive bilateral cerebral CVA.  LDL of 183.  Hemoglobin A1c of 5.4.  Will continue with Xarelto  and Lipitor.  MRI angiogram of the head did not show any significant intracranial stenosis.  On permissive hypertension.  Neurology has recommended to continue home Xarelto .   2D echocardiogram with preserved LV function., carotid duplex ultrasound without any significant stenosis.SABRA  PT/OT saw the patient and did not recommend any special therapy needs except for outpatient PT.  At this time, neurology  has seen the patient and patient is okay for discharge home.   Hyperlipidemia: LDL of 183.  Continue Lipitor 80 milligram daily on discharge   Paroxysmal atrial fibrillation: Currently in normal sinus rhythm. CHA2DS2/VAS    5 >= 2 Points: High Risk.  On Xarelto  at home.  Patient was advised to take it with meals.    Disposition.  At this time, patient is stable for disposition home with outpatient PCP and neurology follow-up  Medical Consultants:   None.  Procedures:    None Subjective:   Today, patient was seen and examined at bedside.  Patient denies any nausea, vomiting dizzy lightheadedness or focal weaknesses  Discharge Exam:   Vitals:   10/10/24 0817 10/10/24 1115  BP: (!) 155/75 (!) 149/85  Pulse: 69 98  Resp: 18 18  Temp: 97.6 F (36.4 C) 97.9 F (36.6 C)  SpO2: 97% 98%   Vitals:   10/10/24 0009 10/10/24 0406 10/10/24 0817 10/10/24 1115  BP: 138/64 127/63 (!) 155/75 (!) 149/85  Pulse: 66 (!) 58 69 98  Resp: 17 16 18 18   Temp: 97.9 F (36.6 C) 97.6 F (36.4 C) 97.6 F (36.4 C) 97.9 F (36.6 C)  TempSrc: Oral Oral Oral Oral  SpO2: 95% 96% 97% 98%  Weight:      Height:       Body mass index is 27.93 kg/m.  General: Alert awake, not in obvious distress, elderly female, Communicative, not in obvious distress HENT: pupils equally reacting to light,  No scleral pallor or icterus noted. Oral mucosa is moist.  Chest:  Clear breath sounds. No crackles or wheezes.  CVS: S1 &S2 heard. No murmur.  Regular rate and rhythm. Abdomen: Soft, nontender, nondistended.  Bowel sounds are heard.   Extremities: No cyanosis, clubbing or edema.  Peripheral pulses are palpable. Psych: Alert, awake and oriented, normal mood CNS:  No cranial nerve deficits.  Power equal in all extremities.   Skin: Warm and dry.  No rashes noted.  The results of significant diagnostics from this hospitalization (including imaging, microbiology, ancillary and laboratory) are listed below for  reference.     Diagnostic Studies:   ECHOCARDIOGRAM COMPLETE Result Date: 10/09/2024    ECHOCARDIOGRAM REPORT   Patient Name:   [name] [name] Date of Exam: 10/09/2024 Medical Rec #:  [id number]    Height:       64.0 in Accession #:    [id number]   Weight:       162.7 lb Date of Birth:  1945/12/02     BSA:          1.792 m Patient Age:    78 years     BP:           132/74 mmHg Patient Gender: F            HR:           68 bpm. Exam Location:  Inpatient Procedure: 2D Echo, 3D Echo, Cardiac Doppler, Color Doppler and Strain Analysis            (Both Spectral and Color Flow Doppler were utilized during            procedure). Indications:    Stroke I63.9  History:        Patient has prior history of Echocardiogram examinations, most                 recent 01/02/2021. Stroke and hx of cancer; Risk                 Factors:Hypertension and Dyslipidemia.  Sonographer:    Koleen Popper RDCS Referring Phys: [phone] EKTA V PATEL  Sonographer Comments: Image acquisition challenging due to respiratory motion. Global longitudinal strain was attempted. IMPRESSIONS  1. Left ventricular ejection fraction, by estimation, is 60 to 65%. Left ventricular ejection fraction by 3D volume is 65 %. The left ventricle has normal function. The left ventricle has no regional wall motion abnormalities. Left ventricular diastolic  parameters were normal. The average left ventricular global longitudinal strain is -18.1 %. The global longitudinal strain is normal.  2. Right ventricular systolic function is normal. The right ventricular size is normal. There is normal pulmonary artery systolic pressure.  3. The mitral valve is normal in structure. Mild mitral valve regurgitation. No evidence of mitral stenosis.  4. The aortic valve is tricuspid. Aortic valve regurgitation is not visualized. No aortic stenosis is present.  5. The inferior vena cava is dilated in size with >50% respiratory variability, suggesting right atrial pressure of 8 mmHg.  FINDINGS  Left Ventricle: Left ventricular ejection fraction, by estimation, is 60 to 65%. Left ventricular ejection fraction by 3D volume is 65 %. The left ventricle has normal function. The left ventricle has no regional wall motion abnormalities. The average left ventricular global longitudinal strain is -18.1 %. Strain was performed and the global longitudinal strain is normal. The left ventricular internal cavity size was normal in size. There is no left ventricular hypertrophy. Left ventricular diastolic parameters were normal. Right Ventricle: The right ventricular size is normal. No increase in right ventricular wall thickness. Right ventricular systolic function is normal. There is  normal pulmonary artery systolic pressure. The tricuspid regurgitant velocity is 2.64 m/s, and  with an assumed right atrial pressure of 8 mmHg, the estimated right ventricular systolic pressure is 35.9 mmHg. Left Atrium: Left atrial size was normal in size. Right Atrium: Right atrial size was normal in size. Pericardium: There is no evidence of pericardial effusion. Mitral Valve: The mitral valve is normal in structure. Mild mitral valve regurgitation. No evidence of mitral valve stenosis. MV peak gradient, 2.4 mmHg. The mean mitral valve gradient is 1.0 mmHg. Tricuspid Valve: The tricuspid valve is normal in structure. Tricuspid valve regurgitation is mild . No evidence of tricuspid stenosis. Aortic Valve: The aortic valve is tricuspid. Aortic valve regurgitation is not visualized. No aortic stenosis is present. Pulmonic Valve: The pulmonic valve was normal in structure. Pulmonic valve regurgitation is not visualized. No evidence of pulmonic stenosis. Aorta: The aortic root is normal in size and structure. Venous: The inferior vena cava is dilated in size with greater than 50% respiratory variability, suggesting right atrial pressure of 8 mmHg. IAS/Shunts: No atrial level shunt detected by color flow Doppler. Additional  Comments: 3D was performed not requiring image post processing on an independent workstation and was normal.  LEFT VENTRICLE PLAX 2D LVIDd:         4.70 cm         Diastology LVIDs:         2.70 cm         LV e' medial:    8.81 cm/s LV PW:         0.90 cm         LV E/e' medial:  7.6 LV IVS:        0.80 cm         LV e' lateral:   12.90 cm/s LVOT diam:     1.87 cm         LV E/e' lateral: 5.2 LV SV:         50 LV SV Index:   28              2D Longitudinal LVOT Area:     2.75 cm        Strain                                2D Strain GLS   -18.1 %                                Avg: LV Volumes (MOD) LV vol d, MOD    119.0 ml      3D Volume EF A4C:                           LV 3D EF:    Left LV vol s, MOD    35.9 ml                    ventricul A4C:                                        ar LV SV MOD A4C:   119.0 ml                   ejection  fraction                                             by 3D                                             volume is                                             65 %.                                 3D Volume EF:                                3D EF:        65 %                                LV EDV:       110 ml                                LV ESV:       39 ml                                LV SV:        72 ml RIGHT VENTRICLE             IVC RV Basal diam:  3.43 cm     IVC diam: 2.28 cm RV S prime:     20.50 cm/s TAPSE (M-mode): 2.8 cm LEFT ATRIUM             Index        RIGHT ATRIUM           Index LA diam:        3.77 cm 2.10 cm/m   RA Area:     14.00 cm LA Vol (A2C):   47.3 ml 26.39 ml/m  RA Volume:   31.80 ml  17.74 ml/m LA Vol (A4C):   53.1 ml 29.63 ml/m LA Biplane Vol: 51.2 ml 28.57 ml/m  AORTIC VALVE LVOT Vmax:   86.00 cm/s LVOT Vmean:  56.600 cm/s LVOT VTI:    0.181 m  AORTA Ao Root diam: 2.87 cm Ao Asc diam:  2.91 cm MITRAL VALVE               TRICUSPID VALVE MV Area (PHT): 2.73 cm    TR Peak grad:   27.9 mmHg MV Area  VTI:   1.88 cm    TR Vmax:        264.00 cm/s MV Peak grad:  2.4 mmHg MV Mean grad:  1.0 mmHg    SHUNTS MV Vmax:       0.77 m/s    Systemic VTI:  0.18 m MV Vmean:  48.5 cm/s   Systemic Diam: 1.87 cm MV Decel Time: 278 msec MR Peak grad: 53.3 mmHg MR Mean grad: 36.0 mmHg MR Vmax:      365.00 cm/s MR Vmean:     278.0 cm/s MV E velocity: 66.90 cm/s MV A velocity: 54.40 cm/s MV E/A ratio:  1.23 Morene Brownie Electronically signed by Morene Brownie Signature Date/Time: 10/09/2024/1:39:40 PM    Final    MR ANGIO HEAD WO CONTRAST Result Date: 10/09/2024 EXAM: MR Angiography Head without intravenous contrast. 10/09/2024 03:39:00 AM TECHNIQUE: Magnetic resonance angiography images of the head without intravenous contrast. Multiplanar 2D and 3D reformatted images are provided for review. COMPARISON: None provided. CLINICAL HISTORY: Neuro deficit, acute, stroke suspected Neuro deficit, acute, stroke suspected FINDINGS: ANTERIOR CIRCULATION: No significant stenosis of the internal carotid arteries. No significant stenosis of the anterior cerebral arteries. No significant stenosis of the middle cerebral arteries. No aneurysm. POSTERIOR CIRCULATION: No significant stenosis of the posterior cerebral arteries. Right fetal type PCA. No significant stenosis of the basilar artery. No significant stenosis of the vertebral arteries. No aneurysm. IMPRESSION: 1. No significant stenosis of the intracranial vasculature. Electronically signed by: Gilmore Molt MD 10/09/2024 03:50 AM EST RP Workstation: [id number]   MR BRAIN WO CONTRAST Result Date: 10/08/2024 EXAM: MRI BRAIN WITHOUT CONTRAST 10/08/2024 04:21:15 PM TECHNIQUE: Multiplanar multisequence MRI of the head/brain was performed without the administration of intravenous contrast. COMPARISON: None available. CLINICAL HISTORY: Neuro deficit, acute, stroke suspected. Left-sided weakness and paresthesias. Slurred speech. Patient awoke with these symptoms today. FINDINGS:  BRAIN AND VENTRICLES: Diffusion weighted images demonstrate multiple small scattered acute infarcts. The largest right-sided infarct is in the posterior right middle frontal gyrus measuring 7 mm on image 59 of series 7. The largest left-sided infarct is in the left parietal lobe measuring 7 mm on image 85 of series 5. Bilateral lacunar infarcts are present in the posterior inferior cerebellum . A linear infarct in the posterior inferior right cerebellum extends 17 mm. T2 and FLAIR hyperintensities are associated with the areas of acute/subacute infarction. No other significant white matter disease is present. No intracranial hemorrhage. No mass. No midline shift. No hydrocephalus. The sella is unremarkable. Normal flow voids. ORBITS: No acute abnormality. SINUSES AND MASTOIDS: No acute abnormality. BONES AND SOFT TISSUES: Normal marrow signal. Multiple raised skin lesions are present throughout the scalp. These lesions may represent neurofibromas or multiple sebaceous cysts. IMPRESSION: 1. Multiple small scattered acute infarcts involving bilateral cerebral hemispheres and posterior inferior right cerebellum, largest lesions measuring 7 mm in the posterior right middle frontal gyrus and left parietal lobe, and a 17 mm linear infarct in the posterior inferior right cerebellum. The infarcts cover multiple vascular territories suggesting a embolic central source. Electronically signed by: Lonni Necessary MD 10/08/2024 05:12 PM EST RP Workstation: [id number]   CT HEAD WO CONTRAST Result Date: 10/08/2024 EXAM: CT HEAD WITHOUT CONTRAST 10/08/2024 12:45:28 PM TECHNIQUE: CT of the head was performed without the administration of intravenous contrast. Automated exposure control, iterative reconstruction, and/or weight based adjustment of the mA/kV was utilized to reduce the radiation dose to as low as reasonably achievable. COMPARISON: CT of the head dated 04/14/2013. CLINICAL HISTORY: Neuro deficit, acute, stroke  suspected. FINDINGS: BRAIN AND VENTRICLES: No acute hemorrhage. No evidence of acute infarct. No hydrocephalus. No extra-axial collection. No mass effect or midline shift. ORBITS: No acute abnormality. SINUSES: There is a polypoid mucosal density within the right maxillary sinus. SOFT TISSUES AND SKULL: No acute soft tissue abnormality. No skull fracture.  IMPRESSION: 1. No acute intracranial abnormality. 2. Polypoid mucosal density within the right maxillary sinus. Electronically signed by: Evalene Coho MD 10/08/2024 12:50 PM EST RP Workstation: [id number]     Labs:   Basic Metabolic Panel: Recent Labs  Lab 10/08/24 0911 10/08/24 1858  NA 141 140  K 3.8 4.0  CL 107 106  CO2 23 23  GLUCOSE 111* 92  BUN 16 14  CREATININE 0.74 0.78  CALCIUM 9.1 9.2   GFR Estimated Creatinine Clearance: 56.1 mL/min (by C-G formula based on SCr of 0.78 mg/dL). Liver Function Tests: Recent Labs  Lab 10/08/24 0911 10/08/24 1858  AST 34 31  ALT 36 36  ALKPHOS 145* 142*  BILITOT 1.1 1.2  PROT 6.7 6.8  ALBUMIN 3.7 3.7   No results for input(s): LIPASE, AMYLASE in the last 168 hours. No results for input(s): AMMONIA in the last 168 hours. Coagulation profile Recent Labs  Lab 10/08/24 0911  INR 1.1    CBC: Recent Labs  Lab 10/08/24 0911 10/08/24 1858  WBC 5.1 6.5  NEUTROABS 3.0  --   HGB 13.5 13.5  HCT 40.4 41.3  MCV 92.4 93.0  PLT 210 201   Cardiac Enzymes: No results for input(s): CKTOTAL, CKMB, CKMBINDEX, TROPONINI in the last 168 hours. BNP: Invalid input(s): POCBNP CBG: Recent Labs  Lab 10/08/24 1402 10/09/24 2134  GLUCAP 91 111*   D-Dimer No results for input(s): DDIMER in the last 72 hours. Hgb A1c Recent Labs    10/08/24 1858  HGBA1C 5.4   Lipid Profile Recent Labs    10/09/24 0139  CHOL 287*  HDL 88  LDLCALC 183*  TRIG 80  CHOLHDL 3.3   Thyroid  function studies Recent Labs    10/08/24 1858  TSH 1.499   Anemia work up No  results for input(s): VITAMINB12, FOLATE, FERRITIN, TIBC, IRON, RETICCTPCT in the last 72 hours. Microbiology No results found for this or any previous visit (from the past 240 hours).   Discharge Instructions:   Discharge Instructions     Ambulatory referral to Physical Therapy   Complete by: As directed    Ambulatory referral to Speech Therapy   Complete by: As directed    Diet - low sodium heart healthy   Complete by: As directed    Discharge instructions   Complete by: As directed    Follow-up with your primary care provider in 1 week.  Check blood work at that time.  Seek medical attention for worsening symptoms.  Continue to take your medications as prescribed.  Take cholesterol medications and blood thinner regularly.  Follow-up in the stroke clinic in 2 months as scheduled by the clinic.   Increase activity slowly   Complete by: As directed       Allergies as of 10/10/2024       Reactions   Naproxen Sodium Hives   Neomycin-bacitracin Zn-polymyx Swelling   In ear, where it was applied.    Tetracycline Rash   All over the body   Apixaban Hives   Advil [ibuprofen] Rash   Other Rash   ALL TAPES-RASH (PT HAS LEAST RXN TO PAPER TAPE)        Medication List     TAKE these medications    acetaminophen  500 MG tablet Commonly known as: TYLENOL  Take 500 mg by mouth every 6 (six) hours as needed for moderate pain.   atorvastatin 80 MG tablet Commonly known as: LIPITOR Take 1 tablet (80 mg total) by mouth daily.  b complex vitamins capsule Take 1 capsule by mouth every other day.   diltiazem  30 MG tablet Commonly known as: Cardizem  Take 1 tablet every 4 hours AS NEEDED for heart rate >100 as long as top blood pressure >100.   magnesium oxide 400 (240 Mg) MG tablet Commonly known as: MAG-OX Take 400 mg by mouth daily.   MORINGA PO Take 1 g by mouth daily.   multivitamin tablet Take 1 tablet by mouth daily.   OVER THE COUNTER  MEDICATION Cholesterol support-nightly   OVER THE COUNTER MEDICATION Energize Electrolytes-1 scoop daily   OVER THE COUNTER MEDICATION Omegas with Turmeric-daily   OVER THE COUNTER MEDICATION Collagen   rivaroxaban  20 MG Tabs tablet Commonly known as: Xarelto  Take 1 tablet (20 mg total) by mouth daily with supper. Take with food. Start after completion of starter pack.   triamcinolone  cream 0.1 % Commonly known as: KENALOG  Apply 1 Application topically 2 (two) times daily.   vitamin C 1000 MG tablet Take 1,000 mg by mouth daily.   VITAMIN K2-VITAMIN D3 PO Take 1 tablet by mouth daily.        Follow-up Information     [facility]. Schedule an appointment as soon as possible for a visit.   Specialty: Rehabilitation Why: Please call to schedule an appointment Contact information: [address] [place], [address] [place]  [phone]                 Time coordinating discharge: 39 minutes  Signed:  Eyvette Cordon  [facility] 10/11/2024, 11:48 AM

## 2024-10-09 NOTE — ED Notes (Signed)
 Patient transported to MRI

## 2024-10-09 NOTE — Evaluation (Addendum)
 Physical Therapy Evaluation Patient Details Name: [name] [name] MRN: [id number] DOB: 06-15-1946 Today's Date: 10/09/2024  History of Present Illness  Patient is a 78 yo female presenting to the ED with L sided weakness, tingling, slurred speech on 10/08/24. CT clear, MRI finding  multiple scattered acute infarcts involving bilateral cerebral hemisphere and posterior inferior right cerebellum infarcts covering multiple vascular territories suggestive of an embolic source. 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  Previously independent and active in community visiting friends and family and going to church.  Currently pt needing supervision to CGA for ambulation in hallway with some L veering and decreased awareness.  Patient will benefit from skilled PT in the acute setting and from outpatient neurorehabilitation at d/c.       If plan is discharge home, recommend the following: Help with stairs or ramp for entrance;Assist for transportation   Can travel by private vehicle        Equipment Recommendations None recommended by PT  Recommendations for Other Services       Functional Status Assessment Patient has had a recent decline in their functional status and demonstrates the ability to make significant improvements in function in a reasonable and predictable amount of time.     Precautions / Restrictions Precautions Precautions: Fall Recall of Precautions/Restrictions: Intact      Mobility  Bed Mobility Overal bed mobility: Modified Independent                  Transfers Overall transfer level: Modified independent                      Ambulation/Gait Ambulation/Gait assistance: Contact guard assist, Supervision Gait Distance (Feet): 180 Feet Assistive device: None Gait  Pattern/deviations: Step-through pattern, Decreased stride length, Drifts right/left       General Gait Details: veering to L at times though able to complete parts of DGI  Stairs            Wheelchair Mobility     Tilt Bed    Modified Rankin (Stroke Patients Only) Modified Rankin (Stroke Patients Only) Pre-Morbid Rankin Score: No symptoms Modified Rankin: Moderately severe disability     Balance                                 Standardized Balance Assessment Standardized Balance Assessment : Dynamic Gait Index, Berg Balance Test Berg Balance Test Sit to Stand: Able to stand  independently using hands Standing Unsupported: Able to stand 2 minutes with supervision Sitting with Back Unsupported but Feet Supported on Floor or Stool: Able to sit safely and securely 2 minutes Standing Unsupported with Eyes Closed: Able to stand 10 seconds safely Standing Ubsupported with Feet Together: Needs help to attain position but able to stand for 30 seconds with feet together From Standing, Reach Forward with Outstretched Arm: Can reach forward >12 cm safely (5) From Standing Position, Turn to Look Behind Over each Shoulder: Looks behind one side only/other side shows less weight shift Turn 360 Degrees: Able to turn 360 degrees safely in 4 seconds or less Dynamic Gait Index Level Surface: Mild Impairment Change in Gait Speed: Mild Impairment Gait with Horizontal Head Turns: Mild Impairment Gait with Vertical Head Turns: Mild Impairment Gait and  Pivot Turn: Normal Step Over Obstacle: Mild Impairment       Pertinent Vitals/Pain Pain Assessment Pain Assessment: No/denies pain    Home Living Family/patient expects to be discharged to:: Private residence Living Arrangements: Alone Available Help at Discharge: Family;Available 24 hours/day Type of Home: Apartment Home Access: Level entry       Home Layout: One level Home Equipment: Grab bars -  tub/shower Additional Comments: likes to read, play games, go shopping, to lunch with friends/family    Prior Function Prior Level of Function : Independent/Modified Independent;Driving             Mobility Comments: independent ADLs Comments: independent, likes to go out with friends and go shoppping     Extremity/Trunk Assessment   Upper Extremity Assessment Upper Extremity Assessment: Defer to OT evaluation    Lower Extremity Assessment Lower Extremity Assessment: Overall WFL for tasks assessed    Cervical / Trunk Assessment Cervical / Trunk Assessment: Normal  Communication   Communication Communication: No apparent difficulties    Cognition Arousal: Alert Behavior During Therapy: WFL for tasks assessed/performed   PT - Cognitive impairments: No apparent impairments                         Following commands: Intact       Cueing       General Comments General comments (skin integrity, edema, etc.): daughter present and supportive, states pt can stay with her if needed at d/c; VSS after ambulation SpO2 100% on RA, HR 87, BP 152/72    Exercises     Assessment/Plan    PT Assessment Patient needs continued PT services  PT Problem List Decreased balance;Decreased mobility;Decreased knowledge of use of DME;Decreased safety awareness       PT Treatment Interventions DME instruction;Gait training;Patient/family education;Functional mobility training;Therapeutic activities;Therapeutic exercise;Balance training    PT Goals (Current goals can be found in the Care Plan section)  Acute Rehab PT Goals Patient Stated Goal: return to independent PT Goal Formulation: With patient/family Time For Goal Achievement: 10/23/24 Potential to Achieve Goals: Good    Frequency Min 3X/week     Co-evaluation               AM-PAC PT 6 Clicks Mobility  Outcome Measure Help needed turning from your back to your side while in a flat bed without using  bedrails?: None Help needed moving from lying on your back to sitting on the side of a flat bed without using bedrails?: None Help needed moving to and from a bed to a chair (including a wheelchair)?: A Little Help needed standing up from a chair using your arms (e.g., wheelchair or bedside chair)?: A Little Help needed to walk in hospital room?: A Little Help needed climbing 3-5 steps with a railing? : A Little 6 Click Score: 20    End of Session Equipment Utilized During Treatment: Gait belt Activity Tolerance: Patient tolerated treatment well Patient left: in bed;with call bell/phone within reach;with family/visitor present   PT Visit Diagnosis: Other abnormalities of gait and mobility (R26.89)    Time: 8597-8579 PT Time Calculation (min) (ACUTE ONLY): 18 min   Charges:   PT Evaluation $PT Eval Low Complexity: 1 Low   PT General Charges $$ ACUTE PT VISIT: 1 Visit         Micheline Portal, PT Acute Rehabilitation Services Office:[phone] 10/09/2024   Montie Portal 10/09/2024, 4:48 PM

## 2024-10-09 NOTE — Progress Notes (Addendum)
 STROKE TEAM PROGRESS NOTE   INTERIM HISTORY/SUBJECTIVE  Patient has remained hemodynamically stable and afebrile.  She reported some numbness to the left side of her face earlier today but now states other symptoms have resolved. MRI scan of the brain shows multiple small scattered acute infarcts involving bilateral cerebral hemisphere as well as posterior inferior right cerebellum.  MR angiogram of the brain shows no large vessel stenosis. OBJECTIVE  CBC    Component Value Date/Time   WBC 6.5 10/08/2024 1858   RBC 4.44 10/08/2024 1858   HGB 13.5 10/08/2024 1858   HGB 14.4 01/26/2012 1001   HCT 41.3 10/08/2024 1858   HCT 42.5 01/26/2012 1001   PLT 201 10/08/2024 1858   PLT 229 01/26/2012 1001   MCV 93.0 10/08/2024 1858   MCV 89.9 01/26/2012 1001   MCH 30.4 10/08/2024 1858   MCHC 32.7 10/08/2024 1858   RDW 14.0 10/08/2024 1858   RDW 13.9 01/26/2012 1001   LYMPHSABS 1.3 10/08/2024 0911   LYMPHSABS 1.6 01/26/2012 1001   MONOABS 0.5 10/08/2024 0911   MONOABS 0.4 01/26/2012 1001   EOSABS 0.2 10/08/2024 0911   EOSABS 0.1 01/26/2012 1001   BASOSABS 0.1 10/08/2024 0911   BASOSABS 0.0 01/26/2012 1001    BMET    Component Value Date/Time   NA 140 10/08/2024 1858   K 4.0 10/08/2024 1858   CL 106 10/08/2024 1858   CO2 23 10/08/2024 1858   GLUCOSE 92 10/08/2024 1858   BUN 14 10/08/2024 1858   CREATININE 0.78 10/08/2024 1858   CALCIUM 9.2 10/08/2024 1858   GFRNONAA >60 10/08/2024 1858    IMAGING past 24 hours MR ANGIO HEAD WO CONTRAST Result Date: 10/09/2024 EXAM: MR Angiography Head without intravenous contrast. 10/09/2024 03:39:00 AM TECHNIQUE: Magnetic resonance angiography images of the head without intravenous contrast. Multiplanar 2D and 3D reformatted images are provided for review. COMPARISON: None provided. CLINICAL HISTORY: Neuro deficit, acute, stroke suspected Neuro deficit, acute, stroke suspected FINDINGS: ANTERIOR CIRCULATION: No significant stenosis of the  internal carotid arteries. No significant stenosis of the anterior cerebral arteries. No significant stenosis of the middle cerebral arteries. No aneurysm. POSTERIOR CIRCULATION: No significant stenosis of the posterior cerebral arteries. Right fetal type PCA. No significant stenosis of the basilar artery. No significant stenosis of the vertebral arteries. No aneurysm. IMPRESSION: 1. No significant stenosis of the intracranial vasculature. Electronically signed by: Gilmore Molt MD 10/09/2024 03:50 AM EST RP Workstation: [id number]   MR BRAIN WO CONTRAST Result Date: 10/08/2024 EXAM: MRI BRAIN WITHOUT CONTRAST 10/08/2024 04:21:15 PM TECHNIQUE: Multiplanar multisequence MRI of the head/brain was performed without the administration of intravenous contrast. COMPARISON: None available. CLINICAL HISTORY: Neuro deficit, acute, stroke suspected. Left-sided weakness and paresthesias. Slurred speech. Patient awoke with these symptoms today. FINDINGS: BRAIN AND VENTRICLES: Diffusion weighted images demonstrate multiple small scattered acute infarcts. The largest right-sided infarct is in the posterior right middle frontal gyrus measuring 7 mm on image 59 of series 7. The largest left-sided infarct is in the left parietal lobe measuring 7 mm on image 85 of series 5. Bilateral lacunar infarcts are present in the posterior inferior cerebellum . A linear infarct in the posterior inferior right cerebellum extends 17 mm. T2 and FLAIR hyperintensities are associated with the areas of acute/subacute infarction. No other significant white matter disease is present. No intracranial hemorrhage. No mass. No midline shift. No hydrocephalus. The sella is unremarkable. Normal flow voids. ORBITS: No acute abnormality. SINUSES AND MASTOIDS: No acute abnormality. BONES AND SOFT TISSUES: Normal  marrow signal. Multiple raised skin lesions are present throughout the scalp. These lesions may represent neurofibromas or multiple sebaceous  cysts. IMPRESSION: 1. Multiple small scattered acute infarcts involving bilateral cerebral hemispheres and posterior inferior right cerebellum, largest lesions measuring 7 mm in the posterior right middle frontal gyrus and left parietal lobe, and a 17 mm linear infarct in the posterior inferior right cerebellum. The infarcts cover multiple vascular territories suggesting a embolic central source. Electronically signed by: Lonni Necessary MD 10/08/2024 05:12 PM EST RP Workstation: [id number]    Vitals:   10/09/24 0930 10/09/24 1000 10/09/24 1100 10/09/24 1200  BP: (!) 160/94 (!) 142/87 (!) 155/80 (!) 147/74  Pulse: 84 78 60 65  Resp: 16 17 14 16   Temp:      TempSrc:      SpO2: 100% 100% 99% 100%  Weight:      Height:         PHYSICAL EXAM General:  Alert, well-nourished, well-developed patient in no acute distress Psych:  Mood and affect appropriate for situation CV: Regular rate and rhythm on monitor Respiratory:  Regular, unlabored respirations on room air   NEURO:  Mental Status: AA&Ox3, patient is able to give clear and coherent history Speech/Language: speech is without dysarthria or aphasia.    Cranial Nerves:  II: PERRL. Visual fields full.  III, IV, VI: EOMI. Eyelids elevate symmetrically.  V: Sensation is intact to light touch and symmetrical VII: Face is symmetrical resting and smiling VIII: hearing intact to voice. IX, X: Phonation is normal.  XII: tongue is midline without fasciculations. Motor: Able to move all 4 extremities with antigravity strength Tone: is normal and bulk is normal Sensation- Intact to light touch bilaterally.  Coordination: FTN intact bilaterally Gait- deferred  Most Recent NIH 0   ASSESSMENT/PLAN  [name] is a 78 y.o. 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  MRI demonstrates multiple embolic appearing strokes.  Discussed  Xarelto  dosing with the patient and encouraged her to take it with a large meal and at the exact same time every day.  Given previous statin intolerance, we will try another statin but also will attempt to obtain Leqvio for patient.  NIH on Admission 0  Acute Ischemic Infarct:  bilateral small infarcts Etiology: Likely embolic in the setting of A-fib on Xarelto  CT head No acute abnormality.  MRI multiple scattered acute infarcts involving bilateral cerebral hemispheres and posterior inferior right cerebellum, likely embolic MRA no LVO or hemodynamically significant stenosis Carotid Doppler pending 2D Echo ejection fraction 60-65%.  Left atrial size was normal. LDL 183 HgbA1c 5.4 VTE prophylaxis -fully anticoagulated with Xarelto  Xarelto  (rivaroxaban ) daily prior to admission, now on Xarelto  (rivaroxaban ) daily, discussed taking this medication with good continuity of food and at the exact same time every day Therapy recommendations:  Pending Disposition: Pending, likely home  Atrial fibrillation Home Meds: Diltiazem  30 mg every 4 hours as needed, Xarelto  20 mg daily Continue telemetry monitoring Continue anticoagulation with Xarelto   Hypertension Home meds: None Stable Blood Pressure Goal: BP less than 220/110   Hyperlipidemia Home meds: None LDL 183, goal < 70 Add atorvastatin 80 mg daily if patient can tolerate and initiate process to obtain Leqvio Continue statin at discharge  Other Stroke Risk Factors Advanced age   Other Active Problems None  Hospital day # 1  Patient seen by NP with MD, MD to edit note  as needed. Cortney E Everitt Clint Kill , MSN, AGACNP-BC [facility] See Amion for schedule and pager information 10/09/2024 1:16 PM  I have personally obtained history,examined this patient, reviewed notes, independently viewed imaging studies, participated in medical decision making and plan of care.ROS completed by me personally and pertinent positives fully  documented  I have made any additions or clarifications directly to the above note. Agree with note above.  Patient presented with sudden onset of left-sided numbness and weakness which appears to have improved.  MRI shows bilateral supratentorial and right cerebellar embolic infarcts likely from A-fib despite anticoagulation with Xarelto .  Patient counseled to take Xarelto  with a proper meal every day consistently at the same time.  And Lipitor 80 mg for her with significantly elevated low-density cholesterol and may also consider Leqvio injections if she qualifies.  Mobilize all out of bed.  Therapy consults.  Likely discharge home later today.  Follow-up as an outpatient stroke clinic in 2 months with nurse practitioner.  Discussed with Dr. Sonjia   I personally spent a total of 50 minutes in the care of the patient today including getting/reviewing separately obtained history, performing a medically appropriate exam/evaluation, counseling and educating, placing orders, referring and communicating with other health care professionals, documenting clinical information in the EHR, independently interpreting results, and coordinating care.         Eather Popp, MD Medical Director [facility] Pager: [phone] 10/09/2024 3:30 PM   To contact Stroke Continuity provider, please refer to [email or web address]. After hours, contact General Neurology

## 2024-10-10 ENCOUNTER — Inpatient Hospital Stay (HOSPITAL_COMMUNITY)

## 2024-10-10 DIAGNOSIS — I639 Cerebral infarction, unspecified: Secondary | ICD-10-CM | POA: Diagnosis not present

## 2024-10-10 DIAGNOSIS — I4891 Unspecified atrial fibrillation: Secondary | ICD-10-CM | POA: Diagnosis not present

## 2024-10-10 DIAGNOSIS — I10 Essential (primary) hypertension: Secondary | ICD-10-CM

## 2024-10-10 DIAGNOSIS — I6340 Cerebral infarction due to embolism of unspecified cerebral artery: Secondary | ICD-10-CM | POA: Diagnosis not present

## 2024-10-10 DIAGNOSIS — R29700 NIHSS score 0: Secondary | ICD-10-CM | POA: Diagnosis not present

## 2024-10-10 NOTE — Plan of Care (Signed)

## 2024-10-10 NOTE — TOC CAGE-AID Note (Signed)
 Transition of Care [facility]) - CAGE-AID Screening   Patient Details  Name: [name] [name] MRN: [id number] Date of Birth: [birth year]  Transition of [facility]) CM/SW Contact:    [name] JULIANNA George, RN Phone Number: 10/10/2024, 3:46 PM   Clinical Narrative:  Pt denies any alcohol or substance use.   CAGE-AID Screening:

## 2024-10-10 NOTE — Progress Notes (Signed)
 Carotid duplex  has been completed. Refer to [facility] under chart review to view preliminary results.   10/10/2024  12:07 PM Aylissa Heinemann, Ricka BIRCH

## 2024-10-10 NOTE — Progress Notes (Signed)
 Physical Therapy Treatment Patient Details Name: [name] [name] MRN: [id number] DOB: 20-Sep-1946 Today's Date: 10/10/2024   History of Present Illness Patient is a 78 yo female presenting to the ED with L sided weakness, tingling, slurred speech on 10/08/24. CT clear, MRI finding  multiple scattered acute infarcts involving bilateral cerebral hemisphere and posterior inferior right cerebellum infarcts covering multiple vascular territories suggestive of an embolic source. 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 Pt demonstrates unsteadiness without UE support during ambulation and is noted to reach out to environment for support occasionally. Noted LLE weakness and intermittent staggering, but pt able to correct with stepping strategy. Pt hesitant to trial SPC, but demonstrates improved stability and pt reports increased comfort. Pt reports that she has SPC at home and plans to stay with daughter initially who works from home. Pt reports flight of steps at daughter's house and is able to complete flight with CGA for safety. Education provided on BEFAST and reducing fall risk. Pt continues to benefit from PT services to progress toward functional mobility goals.    If plan is discharge home, recommend the following: Help with stairs or ramp for entrance;Assist for transportation   Can travel by private vehicle        Equipment Recommendations  Other (comment) (shower seat)    Recommendations for Other Services       Precautions / Restrictions Precautions Precautions: Fall Recall of Precautions/Restrictions: Intact Restrictions Weight Bearing Restrictions Per Provider Order: No     Mobility  Bed Mobility               General bed mobility comments: Pt received sitting EOB and left in recliner    Transfers Overall transfer level: Modified independent                       Ambulation/Gait Ambulation/Gait assistance: Contact guard assist, Supervision Gait Distance (Feet): 220 Feet (+75) Assistive device: None, Straight cane Gait Pattern/deviations: Step-through pattern, Decreased stride length, Drifts right/left, Staggering left       General Gait Details: Pt demonstrates some unsteadiness requiring stepping strategy intermittently, but no overt LOB requiring assist. Pt noted to reach out to environment for support. Pt demonstrates improved stability with SPC   Stairs Stairs: Yes Stairs assistance: Contact guard assist Stair Management: Step to pattern, One rail Right Number of Stairs: 10 General stair comments: Cues for sequencing and improved power up with RLE due to LLE weakness. Decreased eccentric control noted, so instructed in sideways technique for descent for BUE support   Wheelchair Mobility     Tilt Bed    Modified Rankin (Stroke Patients Only) Modified Rankin (Stroke Patients Only) Pre-Morbid Rankin Score: No symptoms Modified Rankin: Moderately severe disability     Balance Overall balance assessment: Mild deficits observed, not formally tested                                          Communication Communication Communication: No apparent difficulties  Cognition Arousal: Alert Behavior During Therapy: WFL for tasks assessed/performed   PT - Cognitive impairments: No apparent impairments                         Following commands: Intact  Cueing    Exercises      General Comments        Pertinent Vitals/Pain Pain Assessment Pain Assessment: No/denies pain     PT Goals (current goals can now be found in the care plan section) Acute Rehab PT Goals Patient Stated Goal: return to independent PT Goal Formulation: With patient/family Time For Goal Achievement: 10/23/24 Progress towards PT goals: Progressing toward goals    Frequency    Min 3X/week        AM-PAC PT 6 Clicks Mobility   Outcome Measure  Help needed turning from your back to your side while in a flat bed without using bedrails?: None Help needed moving from lying on your back to sitting on the side of a flat bed without using bedrails?: None Help needed moving to and from a bed to a chair (including a wheelchair)?: A Little Help needed standing up from a chair using your arms (e.g., wheelchair or bedside chair)?: A Little Help needed to walk in hospital room?: A Little Help needed climbing 3-5 steps with a railing? : A Little 6 Click Score: 20    End of Session Equipment Utilized During Treatment: Gait belt Activity Tolerance: Patient tolerated treatment well Patient left: in chair;with call bell/phone within reach Nurse Communication: Mobility status PT Visit Diagnosis: Other abnormalities of gait and mobility (R26.89)     Time: 9165-9097 PT Time Calculation (min) (ACUTE ONLY): 28 min  Charges:    $Gait Training: 23-37 mins PT General Charges $$ ACUTE PT VISIT: 1 Visit                    Darryle George, PTA Acute Rehabilitation Services Secure Chat Preferred  [phone]    Darryle George 10/10/2024, 9:39 AM

## 2024-10-10 NOTE — Progress Notes (Signed)
 STROKE TEAM PROGRESS NOTE   INTERIM HISTORY/SUBJECTIVE  Patient has remained hemodynamically stable and afebrile.  She is sitting up in bed.  Her daughter is at the bedside.  Carotid ultrasound shows no significant extracranial stenosis.  Patient is feeling better today and willing to go home OBJECTIVE  CBC    Component Value Date/Time   WBC 6.5 10/08/2024 1858   RBC 4.44 10/08/2024 1858   HGB 13.5 10/08/2024 1858   HGB 14.4 01/26/2012 1001   HCT 41.3 10/08/2024 1858   HCT 42.5 01/26/2012 1001   PLT 201 10/08/2024 1858   PLT 229 01/26/2012 1001   MCV 93.0 10/08/2024 1858   MCV 89.9 01/26/2012 1001   MCH 30.4 10/08/2024 1858   MCHC 32.7 10/08/2024 1858   RDW 14.0 10/08/2024 1858   RDW 13.9 01/26/2012 1001   LYMPHSABS 1.3 10/08/2024 0911   LYMPHSABS 1.6 01/26/2012 1001   MONOABS 0.5 10/08/2024 0911   MONOABS 0.4 01/26/2012 1001   EOSABS 0.2 10/08/2024 0911   EOSABS 0.1 01/26/2012 1001   BASOSABS 0.1 10/08/2024 0911   BASOSABS 0.0 01/26/2012 1001    BMET    Component Value Date/Time   NA 140 10/08/2024 1858   K 4.0 10/08/2024 1858   CL 106 10/08/2024 1858   CO2 23 10/08/2024 1858   GLUCOSE 92 10/08/2024 1858   BUN 14 10/08/2024 1858   CREATININE 0.78 10/08/2024 1858   CALCIUM 9.2 10/08/2024 1858   GFRNONAA >60 10/08/2024 1858    IMAGING past 24 hours VAS US  CAROTID Result Date: 10/10/2024 Carotid Arterial Duplex Study Patient Name:  [name] [name]  Date of Exam:   10/10/2024 Medical Rec #: [id number]     Accession #:    [id number] Date of Birth: 1946-09-17      Patient Gender: F Patient Age:   78 years Exam Location:  [facility] Procedure:      VAS US  CAROTID Referring Phys: ELLOUISE MARI --------------------------------------------------------------------------------  Indications:       CVA and left sided weakness. Risk Factors:      Hypertension, hyperlipidemia, coronary artery disease. Comparison Study:  No priors. Performing Technologist: Ricka  Sturdivant-Jones RDMS, RVT  Examination Guidelines: A complete evaluation includes B-mode imaging, spectral Doppler, color Doppler, and power Doppler as needed of all accessible portions of each vessel. Bilateral testing is considered an integral part of a complete examination. Limited examinations for reoccurring indications may be performed as noted.  Right Carotid Findings: +----------+--------+--------+--------+------------------+------------------+           PSV cm/sEDV cm/sStenosisPlaque DescriptionComments           +----------+--------+--------+--------+------------------+------------------+ CCA Prox  77      17                                                   +----------+--------+--------+--------+------------------+------------------+ CCA Distal59      18                                                   +----------+--------+--------+--------+------------------+------------------+ ICA Prox  52      18  intimal thickening +----------+--------+--------+--------+------------------+------------------+ ICA Distal66      22                                                   +----------+--------+--------+--------+------------------+------------------+ ECA       81      11                                                   +----------+--------+--------+--------+------------------+------------------+ +----------+--------+-------+----------------+-------------------+           PSV cm/sEDV cmsDescribe        Arm Pressure (mmHG) +----------+--------+-------+----------------+-------------------+ [id number]            Multiphasic, WNL                    +----------+--------+-------+----------------+-------------------+ +---------+--------+--+--------+--+---------+ VertebralPSV cm/s43EDV cm/s13Antegrade +---------+--------+--+--------+--+---------+  Left Carotid Findings:  +----------+--------+--------+--------+------------------+------------------+           PSV cm/sEDV cm/sStenosisPlaque DescriptionComments           +----------+--------+--------+--------+------------------+------------------+ CCA Prox  87      18                                                   +----------+--------+--------+--------+------------------+------------------+ CCA Distal60      17                                                   +----------+--------+--------+--------+------------------+------------------+ ICA Prox  57      15                                intimal thickening +----------+--------+--------+--------+------------------+------------------+ ICA Distal71      23                                                   +----------+--------+--------+--------+------------------+------------------+ ECA       89      13                                                   +----------+--------+--------+--------+------------------+------------------+ +----------+--------+--------+----------------+-------------------+           PSV cm/sEDV cm/sDescribe        Arm Pressure (mmHG) +----------+--------+--------+----------------+-------------------+ [id number]              Multiphasic, WNL                    +----------+--------+--------+----------------+-------------------+ +---------+--------+--+--------+--+---------+ VertebralPSV cm/s31EDV cm/s10Antegrade +---------+--------+--+--------+--+---------+   Summary: Right Carotid: The extracranial vessels were near-normal with only minimal wall  thickening or plaque. Left Carotid: The extracranial vessels were near-normal with only minimal wall               thickening or plaque. Vertebrals:  Bilateral vertebral arteries demonstrate antegrade flow. Subclavians: Normal flow hemodynamics were seen in bilateral subclavian              arteries. *See table(s) above for measurements and  observations.  Electronically signed by Eather Popp MD on 10/10/2024 at 1:46:08 PM.    Final     Vitals:   10/10/24 0009 10/10/24 0406 10/10/24 0817 10/10/24 1115  BP: 138/64 127/63 (!) 155/75 (!) 149/85  Pulse: 66 (!) 58 69 98  Resp: 17 16 18 18   Temp: 97.9 F (36.6 C) 97.6 F (36.4 C) 97.6 F (36.4 C) 97.9 F (36.6 C)  TempSrc: Oral Oral Oral Oral  SpO2: 95% 96% 97% 98%  Weight:      Height:         PHYSICAL EXAM General:  Alert, well-nourished, well-developed patient in no acute distress Psych:  Mood and affect appropriate for situation CV: Regular rate and rhythm on monitor Respiratory:  Regular, unlabored respirations on room air   NEURO:  Mental Status: AA&Ox3, patient is able to give clear and coherent history Speech/Language: speech is without dysarthria or aphasia.    Cranial Nerves:  II: PERRL. Visual fields full.  III, IV, VI: EOMI. Eyelids elevate symmetrically.  V: Sensation is intact to light touch and symmetrical VII: Face is symmetrical resting and smiling VIII: hearing intact to voice. IX, X: Phonation is normal.  XII: tongue is midline without fasciculations. Motor: Able to move all 4 extremities with antigravity strength Tone: is normal and bulk is normal Sensation- Intact to light touch bilaterally.  Coordination: FTN intact bilaterally Gait- deferred  Most Recent NIH 0   ASSESSMENT/PLAN  [name] [name] is a 78 y.o. 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  MRI demonstrates multiple embolic appearing strokes.  Discussed Xarelto  dosing with the patient and encouraged her to take it with a large meal and at the exact same time every day.  Given previous statin intolerance, we will try another statin but also will attempt to obtain Leqvio for patient.  NIH on Admission 0  Acute Ischemic Infarct:  bilateral small infarcts Etiology: Likely  embolic in the setting of A-fib on Xarelto  CT head No acute abnormality.  MRI multiple scattered acute infarcts involving bilateral cerebral hemispheres and posterior inferior right cerebellum, likely embolic MRA no LVO or hemodynamically significant stenosis Carotid Doppler no significant extracranial stenosis bilaterally  2D Echo ejection fraction 60-65%.  Left atrial size was normal. LDL 183 HgbA1c 5.4 VTE prophylaxis -fully anticoagulated with Xarelto  Xarelto  (rivaroxaban ) daily prior to admission, now on Xarelto  (rivaroxaban ) daily, discussed taking this medication with good continuity of food and at the exact same time every day Therapy recommendations:  Pending Disposition: Pending, likely home  Atrial fibrillation Home Meds: Diltiazem  30 mg every 4 hours as needed, Xarelto  20 mg daily Continue telemetry monitoring Continue anticoagulation with Xarelto   Hypertension Home meds: None Stable Blood Pressure Goal: BP less than 220/110   Hyperlipidemia Home meds: None LDL 183, goal < 70 Add atorvastatin 80 mg daily if patient can tolerate and initiate process to obtain Leqvio Continue statin at discharge  Other Stroke Risk Factors Advanced age   Other Active Problems None  Hospital  day # 2    Patient presented with sudden onset of left-sided numbness and weakness which appears to have improved.  MRI shows bilateral supratentorial and right cerebellar embolic infarcts likely from A-fib despite anticoagulation with Xarelto .  Patient counseled to take Xarelto  with a proper meal every day consistently at the same time.  And Lipitor 80 mg for her with significantly elevated low-density cholesterol and may also consider Leqvio injections if she qualifies.  Mobilize all out of bed.  Therapy consults.  Likely discharge home later today.  Follow-up as an outpatient stroke clinic in 2 months with nurse practitioner.  Discussed with Dr. Sonjia, patient and her daughter and answered  questions   I personally spent a total of 35 minutes in the care of the patient today including getting/reviewing separately obtained history, performing a medically appropriate exam/evaluation, counseling and educating, placing orders, referring and communicating with other health care professionals, documenting clinical information in the EHR, independently interpreting results, and coordinating care.         Eather Popp, MD Medical Director [facility] Pager: [phone] 10/10/2024 2:21 PM   To contact Stroke Continuity provider, please refer to [email or web address]. After hours, contact General Neurology

## 2024-10-10 NOTE — Plan of Care (Signed)
 Progressing

## 2024-10-10 NOTE — TOC Transition Note (Addendum)
 Transition of Care [facility]) - Discharge Note   Patient Details  Name: [name] [name] MRN: [id number] Date of Birth: July 30, 1946  Transition of Care [facility] CM/SW Contact:  Landry DELENA Senters, RN Phone Number: 10/10/2024, 11:23 AM   Clinical Narrative:     Patient will be discharging to home today, with her daughter providing transport. Daughter does report she is able to provide transportation and assistance to patient when needed, as patient lives at home alone. Referral sent to [facility] for therapy. Info added to AVS.   No other needs identified by CM.   Final next level of care: OP Rehab Barriers to Discharge: No Barriers Identified   Patient Goals and CMS Choice   CMS Medicare.gov Compare Post Acute Care list provided to:: Patient Choice offered to / list presented to : Patient      Discharge Placement                       Discharge Plan and Services Additional resources added to the After Visit Summary for                                       Social Drivers of Health (SDOH) Interventions SDOH Screenings   Food Insecurity: No Food Insecurity (10/08/2024)  Housing: Low Risk  (10/08/2024)  Transportation Needs: No Transportation Needs (10/08/2024)  Utilities: Not At Risk (10/08/2024)  Alcohol Screen: Low Risk  (04/14/2024)  Depression (PHQ2-9): Low Risk  (04/14/2024)  Financial Resource Strain: Low Risk  (04/14/2024)  Physical Activity: Insufficiently Active (04/14/2024)  Social Connections: Moderately Integrated (10/08/2024)  Stress: No Stress Concern Present (04/14/2024)  Tobacco Use: Low Risk  (10/08/2024)  Health Literacy: Adequate Health Literacy (04/14/2024)     Readmission Risk Interventions     No data to display

## 2024-10-10 NOTE — Evaluation (Signed)
 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   Shortness of breath 06/25/2011   HLD (hyperlipidemia) 03/28/2010   PALPITATIONS 03/27/2010   Past Medical History:  Past Medical History:  Diagnosis Date   Anxiety    Arthritis    Atrial fibrillation (HCC)    Benign colon polyp    Cancer (HCC) 2012   breast- left   Complication of anesthesia    Diverticulosis    Dysrhythmia    History of breast cancer    s/p lumpectomy and HRT   Hyperlipidemia    intolerant of all cholesterol meds   Obesity    Palpitations    PONV (postoperative nausea and vomiting)    Past Surgical History:  Past Surgical History:  Procedure Laterality Date   ABDOMINAL HYSTERECTOMY  partial   per pt she still has uterus   BREAST BIOPSY Right 2011   benign   BREAST BIOPSY  10/22/2022   MM RT RADIOACTIVE SEED LOC MAMMO GUIDE 10/22/2022 [facility] MAMMOGRAPHY   BREAST LUMPECTOMY  07/26/2007   Left lumpectomy+sln,ER+PR-,Her2-,T1bN0   BREAST LUMPECTOMY WITH RADIOACTIVE SEED LOCALIZATION Right 10/23/2022   Procedure: RIGHT BREAST LUMPECTOMY WITH RADIOACTIVE SEED LOCALIZATION;  Surgeon: Curvin Deward MOULD, MD;  Location: [facility];  Service: General;  Laterality: Right;   CHOLECYSTECTOMY     CP : ETT 10/11     Medical History. Walked 630 on [place] protocl. normal ECG   CT RADIATION THERAPY GUIDE     KNEE ARTHROSCOPY     left 2004; right 2014   Left Knee Score     OOPHORECTOMY  2008   scalp surgery  2012   cylindromas- trichoepethliomas   TOTAL  ABDOMINAL HYSTERECTOMY W/ BILATERAL SALPINGOOPHORECTOMY     HPI:  Patient is a 78 yo female presenting to the ED with L sided weakness, tingling, slurred speech on 10/08/24. CT clear, MRI finding  multiple scattered acute infarcts involving bilateral cerebral hemisphere and posterior inferior right cerebellum infarcts covering multiple vascular territories suggestive of an embolic source. PMH includes:  breast cancer s/p lumptectomy, HLD, afib, arthritis, and DVT (on Xarelto ), HTN   Assessment / Plan / Recommendation Clinical Impression  Pt lives independently in an apartment and reports acute changes to processing. She scored WFL on all subsections of the Cognistat excluding delayed recall, calculations, and complex reasoning. She recalled 3/4 words after a delay, which increased to 4/4 given Min semantic cueing. Calculations were exceptionally challening and the most notable change from baseline per pt and her daughter. Simple verbal reasoning appeared [facility] but she had increased difficulty with abstract concepts. Education was provided and plan to f/u on an OP basis was discussed. Pt and her daughter are agreeable and SLP will sign off acutely.    SLP Assessment  SLP Recommendation/Assessment: All further Speech Language Pathology needs can be addressed in the next venue of care SLP Visit Diagnosis: Cognitive communication deficit (R41.841)     Assistance Recommended at Discharge  PRN  Functional Status Assessment Patient has had a recent decline in their functional status and demonstrates the ability to make significant  improvements in function in a reasonable and predictable amount of time.  Frequency and Duration           SLP Evaluation Cognition  Overall Cognitive Status: Impaired/Different from baseline Arousal/Alertness: Awake/alert Orientation Level: Oriented X4 Attention: Selective Selective Attention: Appears intact Memory: Impaired Memory Impairment: Retrieval  deficit Awareness: Appears intact Problem Solving: Impaired Problem Solving Impairment: Verbal basic Executive Function: Reasoning Reasoning: Impaired Reasoning Impairment: Verbal complex       Comprehension  Auditory Comprehension Overall Auditory Comprehension: Appears within functional limits for tasks assessed    Expression Expression Primary Mode of Expression: Verbal Verbal Expression Overall Verbal Expression: Appears within functional limits for tasks assessed Written Expression Dominant Hand: Right   Oral / Motor  Oral Motor/Sensory Function Overall Oral Motor/Sensory Function: Within functional limits Motor Speech Overall Motor Speech: Appears within functional limits for tasks assessed            Damien Blumenthal, M.A., CCC-SLP Speech Language Pathology, Acute Rehabilitation Services  Secure Chat preferred [phone]  10/10/2024, 10:57 AM

## 2024-10-11 ENCOUNTER — Other Ambulatory Visit: Payer: Self-pay | Admitting: Family Medicine

## 2024-10-11 ENCOUNTER — Telehealth: Payer: Self-pay

## 2024-10-11 DIAGNOSIS — E782 Mixed hyperlipidemia: Secondary | ICD-10-CM

## 2024-10-11 MED ORDER — SIMVASTATIN 40 MG PO TABS: 40.0000 mg | ORAL_TABLET | Freq: Every day | ORAL | 1 refills | Status: DC

## 2024-10-11 NOTE — Transitions of Care (Post Inpatient/ED Visit) (Signed)
 10/11/2024  Name: [name] [name] MRN: [id number] DOB: 1946-06-24  Today's TOC FU Call Status: Today's TOC FU Call Status:: Successful TOC FU Call Completed TOC FU Call Complete Date: 10/11/24  Patient's Name and Date of Birth confirmed. Name, DOB  Transition Care Management Follow-up Telephone Call Date of Discharge: 10/10/24 Discharge Facility: [name]) Type of Discharge: Inpatient Admission Primary Inpatient Discharge Diagnosis:: Acute CVA How have you been since you were released from the hospital?: Better (feel tired but over all feeling ok) Any questions or concerns?: No  Items Reviewed: Did you receive and understand the discharge instructions provided?: Yes Medications obtained,verified, and reconciled?: Yes (Medications Reviewed) Any new allergies since your discharge?: No Dietary orders reviewed?: Yes Type of Diet Ordered:: low sodium heart healthy diet Do you have support at home?: Yes People in Home [RPT]: child(ren), adult Name of Support/Comfort Primary Source: [name] [name]  Medications Reviewed Today: Medications Reviewed Today     Reviewed by [name] Alan PENNER, RN (Registered Nurse) on 10/11/24 at 1114  Med List Status: <None>   Medication Order Taking? Sig Documenting Provider Last Dose Status Informant  acetaminophen  (TYLENOL ) 500 MG tablet [id number] Yes Take 500 mg by mouth every 6 (six) hours as needed for moderate pain. Provider, Historical, [name]  Active Self, Pharmacy Records           Med Note ([name] [name] Oct 08, 2024  1:22 PM)    Ascorbic Acid (VITAMIN C) 1000 MG tablet [id number] Yes Take 1,000 mg by mouth daily. [name] Heron [name], [name]  Active Self, Pharmacy Records  atorvastatin (LIPITOR) 80 MG tablet [id number]  Take 1 tablet (80 mg total) by mouth daily.  Patient not taking: Reported on 10/11/2024   [name], Laxman, [name]  Active   b complex vitamins capsule [id number] Yes Take 1 capsule by mouth every other day. [name] Heron [name], [name]  Active  Self, Pharmacy Records  diltiazem  (CARDIZEM ) 30 MG tablet [id number]  Take 1 tablet every 4 hours AS NEEDED for heart rate >100 as long as top blood pressure >100.  Patient not taking: Reported on 10/11/2024   [name] Heron [name], [name]  Active Self, Pharmacy Records  magnesium oxide (MAG-OX) 400 (240 Mg) MG tablet [id number] Yes Take 400 mg by mouth daily. [name] Heron [name], [name]  Active Self, Pharmacy Records  Moringa Oleifera [facility] PO) [id number] Yes Take 1 g by mouth daily. Provider, Historical, [name]  Active Self, Pharmacy Records  Multiple Vitamin (MULTIVITAMIN) tablet [id number] Yes Take 1 tablet by mouth daily. [name] Heron [name], [name]  Active Self, Pharmacy Records  OVER THE COUNTER MEDICATION [id number] Yes Cholesterol support-nightly Provider, Historical, [name]  Active Self, Pharmacy Records  OVER THE COUNTER MEDICATION [id number] Yes Energize Electrolytes-1 scoop daily Provider, Historical, [name]  Active Self, Pharmacy Records  OVER THE COUNTER MEDICATION [id number] Yes Omegas with Turmeric-daily Provider, Historical, [name]  Active Self, Pharmacy Records  OVER THE COUNTER MEDICATION [id number] Yes Collagen Provider, Historical, [name]  Active Self, Pharmacy Records  OVER THE COUNTER MEDICATION [id number] Yes Take 2,700 mg by mouth daily. Provider, Historical, [name]  Active            Med Note ([name] [name]   Wed Oct 11, 2024 11:14 AM) Vitamin c and quectin  rivaroxaban  (XARELTO ) 20 MG TABS tablet [id number] Yes Take 1 tablet (20 mg total) by mouth daily with supper. Take with food. Start after completion of starter pack.  Patient taking differently: Take 1 tablet (20 mg  total) by mouth daily with supper. Take with food. Start after completion of starter pack.   [name] [name], RPH-CPP  Active Self, Pharmacy Records           Med Note ([name] [name] Oct 08, 2024  1:28 PM) Pt reports normally taking this medication in the morning at 9:00 AM; however, yesterday she had it around 1:00 PM   triamcinolone  cream  (KENALOG ) 0.1 % [id number] Yes Apply 1 Application topically 2 (two) times daily. [name] Heron [name] [name]  Active Self, Pharmacy Records  Vitamin D -Vitamin K (VITAMIN K2-VITAMIN D3 PO) [id number] Yes Take 1 tablet by mouth daily. Provider, Historical, [name]  Active Self, Pharmacy Records            Home Care and Equipment/Supplies: Were Home Health Services Ordered?: No Any new equipment or medical supplies ordered?: No  Functional Questionnaire: Do you need assistance with bathing/showering or dressing?: Yes (daughter) Do you need assistance with meal preparation?: Yes (daughter) Do you need assistance with eating?: No Do you have difficulty maintaining continence: No Do you need assistance with getting out of bed/getting out of a chair/moving?: Yes (daughter) Do you have difficulty managing or taking your medications?: No  Follow up appointments reviewed: PCP Follow-up appointment confirmed?: No (will call today.) Specialist Hospital Follow-up appointment confirmed?: No Reason Specialist Follow-Up Not Confirmed: Patient has Specialist Provider Number and will Call for Appointment Do you need transportation to your follow-up appointment?: No Do you understand care options if your condition(s) worsen?: Yes-patient verbalized understanding  SDOH Interventions Today    Flowsheet Row Most Recent Value  SDOH Interventions   Food Insecurity Interventions Intervention Not Indicated  Housing Interventions Intervention Not Indicated  Transportation Interventions Intervention Not Indicated  Utilities Interventions Intervention Not Indicated   Placed call to patient who reports that she is feeling tired.  Reports left sided weakness. Has planned visit with therapy tomorrow. Reports that she has not been able to take statins in the past. Reports she has taken lipitor  and crestor in the past and she had joint and muscle pain. I updated allergy list.  Patient was prescribed Lipitor  at discharge.   Patient did not pick up RX.  Secure message sent to Dr. Rosemarie to inquire about medication and BP parameters.  Below is secure chat conversation:  Patient was discharged yesterday from an acute stroke . She states that she can not take Lipitor  or crestor but Lipitor  was ordered at discharge. Can you send in another RX for whatever medication that you want her to try. Also her BP today is 145/89. She is monitoring BP at home and does not know parameter of when to call [name]. Please advise. Thanks  Vernal Alstrom, [name] and Heron HERO Ozell, [name] were added by Eather GORMAN Rosemarie, [name]. 11 mins PS Eather GORMAN Rosemarie, [name] she can contact her PCP Dr [name] for the change  11 mins What range do you want her BP to be in?  10 mins PS Eather GORMAN Rosemarie, [name] 140-180 systolic x 1 week and then below 140/90  9 mins ok. I will let her know and I will add Dr. Ozell to this chat.   Please see above message for this patient from Dr. Rosemarie.   Can you determine what statin she needs to take and send in RX. Thanks  7 mins PS Eather GORMAN Rosemarie, [name] I am not sure if she has tried zocor  or pravacol 40 mg daily before if not  either is fine and she can also take coq10 200 mg daily over the counter to reduce statin myalgias  5 mins Heron [name] Sharper, [name] thank you for the add, her allergy lists joint pain and weakness from statins she has taken in the past, we can try simvastatin and Q10 but if she cannot tolerate then she will need injectable like Repatha or something of this nature.  BM would you like me to send in a script for the simvastatin to her pharmacy?  3 mins BM PS Eather GORMAN Popp, [name] thanks agree with your plan  2 mins Thanks everyone. I will notify patient to get from pharmacy today. Thanks for the collaboration.   Placed call to patient to advise of above. No answer. Left VM. Awaiting a call back.   1403:  Reached out to patient again and reviewed above recommendations.   Interventions: Reviewed all  discharge instructions and medications. Collaborated with PCP and neuro about medications and parameters for BP. Reviewed signs and symptoms of stroke. Reviewed BE FAST. Reviewed Afib and suggested patient use her apple watch to help understand if she is in Afib and does not feel it.  Reviewed bleeding precautions.  Encouraged patient to continue to monitor BP at home and call [name] for any abnormal readings from the above parameters.  Encouraged patient to call PCP and schedule hospital follow up.   Reviewed and offered 30 day TOC program. Patient declined. Provided my contact information for patient to call me if needed.    Alan Ee, RN, BSN, CEN [employer]- Transition of Care Team.  [facility] [phone]

## 2024-10-12 ENCOUNTER — Other Ambulatory Visit: Payer: Self-pay

## 2024-10-12 ENCOUNTER — Ambulatory Visit: Attending: Internal Medicine

## 2024-10-12 DIAGNOSIS — I639 Cerebral infarction, unspecified: Secondary | ICD-10-CM | POA: Insufficient documentation

## 2024-10-12 NOTE — Therapy (Signed)
 OUTPATIENT SPEECH LANGUAGE PATHOLOGY EVALUATION   Patient Name: [name] [name] MRN: [id number] DOB:[birth year], 78 y.o., female Today's Date: 10/12/2024  PCP: Ozell Heron [name], MD  REFERRING PROVIDER:  Sonjia Held, MD      END OF SESSION:  End of Session - 10/12/24 1055     Visit Number 1    Number of Visits 17    Date for Recertification  12/07/24    SLP Start Time 0945    SLP Stop Time  1037    SLP Time Calculation (min) 52 min    Activity Tolerance Patient tolerated treatment well          Past Medical History:  Diagnosis Date   Anxiety    Arthritis    Atrial fibrillation (HCC)    Benign colon polyp    Cancer (HCC) 2012   breast- left   Complication of anesthesia    Diverticulosis    Dysrhythmia    History of breast cancer    s/p lumpectomy and HRT   Hyperlipidemia    intolerant of all cholesterol meds   Obesity    Palpitations    PONV (postoperative nausea and vomiting)    Past Surgical History:  Procedure Laterality Date   ABDOMINAL HYSTERECTOMY  partial   per pt she still has uterus   BREAST BIOPSY Right 2011   benign   BREAST BIOPSY  10/22/2022   MM RT RADIOACTIVE SEED LOC MAMMO GUIDE 10/22/2022 [facility]MO[facility]AP[facility]   BREAST LUMPECTOMY  07/26/2007   Left lumpectomy+sln,ER+PR-,Her2-,T1bN0   BREAST LUMPECTOMY WITH RADIOACTIVE SEED LOCALIZATION Right 10/23/2022   Procedure: RIGHT BREAST LUMPECTOMY WITH RADIOACTIVE SEED LOCALIZATION;  Surgeon: Curvin Deward MOULD, MD;  Location: [facility];  Service: General;  Laterality: Right;   CHOLECYSTECTOMY     CP : ETT 10/11     Medical History. Walked 630 on [address] protocl. 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 Pt accompanied by: self and family member  PERTINENT HISTORY: Patient is a 78 yo female presenting to [facility] s/p acute CVA with onset on 10/08/24. MRI finding from hospitalization displayed multiple scattered acute infarcts involving bilateral cerebral hemisphere and posterior inferior right cerebellum infarcts covering multiple vascular territories suggestive of an embolic source. PMHx includes: breast cancer s/p lumptectomy, HLD, afib, arthritis, and DVT, HTN. Pt was fully independent prior to CVA.     PAIN:  Are you having pain? No  FALLS: Has patient fallen in last 6 months?  See PT evaluation for details  LIVING ENVIRONMENT: Lives with: lives with their family Lives in: House/apartment  PLOF:  Level of assistance: Independent with ADLs Employment: Retired  PATIENT GOALS: To improve processing and math skills s/p acue CVA.  OBJECTIVE:  Note: Objective measures were completed at Evaluation unless otherwise noted.  DIAGNOSTIC FINDINGS: MRI BRAIN WITHOUT CONTRAST 10/08/2024 04:21:15 PM   TECHNIQUE: Multiplanar multisequence MRI of the head/brain was performed without the administration of intravenous contrast.   COMPARISON: None available.   CLINICAL HISTORY: Neuro deficit, acute, stroke suspected. Left-sided weakness and  paresthesias. Slurred speech. Patient awoke with these symptoms today.   FINDINGS:   BRAIN AND VENTRICLES: Diffusion weighted images demonstrate multiple small scattered acute infarcts. The largest right-sided infarct is in the posterior right middle frontal gyrus measuring 7 mm on image 59 of series 7. The largest left-sided infarct is in the left parietal lobe measuring 7 mm on image 85 of series 5. Bilateral lacunar infarcts are present in the posterior inferior cerebellum . A linear infarct in the posterior inferior right cerebellum extends 17 mm.   T2 and FLAIR hyperintensities are associated with the areas of acute/subacute infarction.   No other significant white matter disease is present. No intracranial hemorrhage. No mass. No midline shift. No hydrocephalus. The sella is unremarkable. Normal flow voids.   ORBITS: No acute abnormality. SINUSES AND MASTOIDS: No acute abnormality.   BONES AND SOFT TISSUES: Normal marrow signal. Multiple raised skin lesions are present throughout the scalp. These lesions may represent neurofibromas or multiple sebaceous cysts.   IMPRESSION: 1. Multiple small scattered acute infarcts involving bilateral cerebral hemispheres and posterior inferior right cerebellum, largest lesions measuring 7 mm in the posterior right middle frontal gyrus and left parietal lobe, and a 17 mm linear infarct in the posterior inferior right cerebellum. The infarcts cover multiple vascular territories suggesting a embolic central source.  COGNITION: Overall cognitive status: Impaired Areas of impairment:  Attention: Impaired: Selective Memory: Impaired: Immediate Working Short term Psychologist, Educational function: Impaired: Problem solving, Organization, Planning, and Slow processing Functional deficits: Functional math skills for budgeting and other ADLs. Due to very recent hospital course for CVA (10/08/24-10/10/24), full functional impact will need to be explored in  upcoming sessions.   COGNITIVE COMMUNICATION: Following directions: Follows multi-step commands with increased time  Auditory comprehension: Impaired: Pt presents with slow processing time for auditory stimuli. Presenting simple and complex information using a slow rate of speech was helpful for pt. Verbal expression: WFL Functional communication: WFL  ORAL MOTOR EXAMINATION: Overall status: Did not assess Comments: N/A  STANDARDIZED ASSESSMENTS: SLUMS: 13/30  PATIENT REPORTED OUTCOME MEASURES (PROM): PROM was not completed during this session.                                                                                                                            TREATMENT DATE:   10/12/24: Evaluation was initiated. SLP completed SLUMS with pt. Plan is to evaluate reading and writing more in-depth in upcoming session.    PATIENT EDUCATION: Education details: Post-stroke education regarding cognitive changes and  communication support strategies (slow rate of speech for improving pt processing). Person educated: Patient and pt's daughter Carlynn) Education method: Explanation and Demonstration Education comprehension: verbalized understanding and needs further education   GOALS: Goals reviewed with patient? Yes  SHORT TERM GOALS: Target date: 11/11/2024  Pt will participate in evaluations for reading and writing for additional goal setting and holistic clinical picture.  Baseline:  Goal status: INITIAL  2.  Pt will complete functional math problems with occasional mod to min A in 80% of opportunities.  Baseline: N/A Goal status: INITIAL  3.  Pt will complete cognitive-communication HEP across 3 sessions. Baseline: N/A Goal status: INITIAL  4.  Pt and pt's family will use communication strategies to support processing during conversation in 80% of opportunities with occasional min A.  Baseline: N/A Goal status: INITIAL  5.  Pt will demonstrate comprehension of  moderately complex information for optimizing auditory processing during 4 minute conversation segments with frequent mod to min A. Baseline: N/A Goal status: INITIAL  6.  TBD Baseline:  Goal status: INITIAL  LONG TERM GOALS: Target date: 12/08/2023, more goals will potentially be added after further inquiry into reading and writing skills in upcoming session.  Pt will utilize cognitive-communication strategies to perform functional math problems with rare min A. Baseline:  Goal status: INITIAL  2.  Pt will summarize 8 minute long segments of moderately complex information with rare min A. Baseline:  Goal status: INITIAL  3.  Pt will demonstrated adequate cognitive-communication skills for ADL participation with occasional min A.  Baseline: Pt's cog-comm deficits impact ADL participation.  Goal status: INITIAL  4.  TBD Baseline:  Goal status: INITIAL   ASSESSMENT:  CLINICAL IMPRESSION: Patient is a 77 y.o. female who was seen today for cognitive-linguistic evaluation s/p acute CVA on 10/08/2024. Pt presents with mild-moderate cognitive-communication difficulties characterized by short-term memory impairment, loss of math skills (including writing numbers), and executive function deficits in the areas of organization, planning, and slow processing. Pt noted that her slow processing, especially for auditory stimuli, and loss of math skills have been noticeable impacts and changes from PLOF. Pt was fully independent with ADLs prior to CVA. During clock drawing task, pt displayed difficulty with writing numbers; however, pt correctly wrote number in word form (ex. 6). Calculation task during SLUMs was highly difficult for pt along with clock drawing task due to working with numbers. Pt's daughter noted that pt's emotional and mental state appear low due to recent changes from CVA; she continued to note that continuous encouragement would be helpful for the pt during recovery. Pt's auditory  processing appeared to improve with slow rate of speech during presentation of information. Pt noted that she is feeling increased levels of fatigue s/p acute CVA and that this fatigue has been frustrating. Pt's daughter stated that pt was more energetic and communicative during conversations prior to recent CVA. Pt was reader prior to CVA; reading and writing will be tested in upcoming sessions for goal-setting.   OBJECTIVE IMPAIRMENTS: include memory, executive functioning, and receptive language. These impairments are limiting patient from ADLs/IADLs. Factors affecting potential to achieve goals and functional outcome are N/A. Patient will benefit from skilled SLP services to address above impairments and improve overall function.  REHAB POTENTIAL: Excellent  PLAN:  SLP FREQUENCY: 2x/week  SLP DURATION: 8 weeks  PLANNED INTERVENTIONS: Environmental controls, Cueing hierachy, Cognitive reorganization, Internal/external aids, Functional tasks, and Patient/family education    Waddell Music, CF-SLP 10/12/2024, 11:32 AM

## 2024-10-14 ENCOUNTER — Encounter (HOSPITAL_COMMUNITY): Payer: Self-pay

## 2024-10-14 ENCOUNTER — Other Ambulatory Visit: Payer: Self-pay

## 2024-10-14 ENCOUNTER — Emergency Department (HOSPITAL_COMMUNITY)

## 2024-10-14 ENCOUNTER — Inpatient Hospital Stay (HOSPITAL_COMMUNITY)
Admission: EM | Admit: 2024-10-14 | Discharge: 2024-10-20 | DRG: 064 | Disposition: A | Discharge status: Hospice | Attending: Internal Medicine | Admitting: Internal Medicine

## 2024-10-14 DIAGNOSIS — Z808 Family history of malignant neoplasm of other organs or systems: Secondary | ICD-10-CM

## 2024-10-14 DIAGNOSIS — R52 Pain, unspecified: Secondary | ICD-10-CM | POA: Diagnosis not present

## 2024-10-14 DIAGNOSIS — G9349 Other encephalopathy: Secondary | ICD-10-CM | POA: Diagnosis present

## 2024-10-14 DIAGNOSIS — E782 Mixed hyperlipidemia: Secondary | ICD-10-CM

## 2024-10-14 DIAGNOSIS — Z91048 Other nonmedicinal substance allergy status: Secondary | ICD-10-CM

## 2024-10-14 DIAGNOSIS — R29818 Other symptoms and signs involving the nervous system: Secondary | ICD-10-CM | POA: Diagnosis not present

## 2024-10-14 DIAGNOSIS — I4891 Unspecified atrial fibrillation: Secondary | ICD-10-CM | POA: Diagnosis not present

## 2024-10-14 DIAGNOSIS — Z881 Allergy status to other antibiotic agents status: Secondary | ICD-10-CM

## 2024-10-14 DIAGNOSIS — R29702 NIHSS score 2: Secondary | ICD-10-CM | POA: Diagnosis present

## 2024-10-14 DIAGNOSIS — N280 Ischemia and infarction of kidney: Secondary | ICD-10-CM | POA: Diagnosis present

## 2024-10-14 DIAGNOSIS — Z86718 Personal history of other venous thrombosis and embolism: Secondary | ICD-10-CM

## 2024-10-14 DIAGNOSIS — Z66 Do not resuscitate: Secondary | ICD-10-CM | POA: Diagnosis not present

## 2024-10-14 DIAGNOSIS — Z7189 Other specified counseling: Secondary | ICD-10-CM | POA: Diagnosis not present

## 2024-10-14 DIAGNOSIS — Z1732 Human epidermal growth factor receptor 2 negative status: Secondary | ICD-10-CM

## 2024-10-14 DIAGNOSIS — D6869 Other thrombophilia: Secondary | ICD-10-CM | POA: Diagnosis present

## 2024-10-14 DIAGNOSIS — Z7901 Long term (current) use of anticoagulants: Secondary | ICD-10-CM

## 2024-10-14 DIAGNOSIS — I6782 Cerebral ischemia: Secondary | ICD-10-CM | POA: Diagnosis not present

## 2024-10-14 DIAGNOSIS — I63133 Cerebral infarction due to embolism of bilateral carotid arteries: Secondary | ICD-10-CM

## 2024-10-14 DIAGNOSIS — I639 Cerebral infarction, unspecified: Secondary | ICD-10-CM

## 2024-10-14 DIAGNOSIS — I6340 Cerebral infarction due to embolism of unspecified cerebral artery: Secondary | ICD-10-CM | POA: Diagnosis not present

## 2024-10-14 DIAGNOSIS — Z515 Encounter for palliative care: Secondary | ICD-10-CM | POA: Diagnosis not present

## 2024-10-14 DIAGNOSIS — R4182 Altered mental status, unspecified: Secondary | ICD-10-CM | POA: Diagnosis not present

## 2024-10-14 DIAGNOSIS — G8929 Other chronic pain: Secondary | ICD-10-CM | POA: Diagnosis present

## 2024-10-14 DIAGNOSIS — Z711 Person with feared health complaint in whom no diagnosis is made: Secondary | ICD-10-CM | POA: Diagnosis not present

## 2024-10-14 DIAGNOSIS — Z853 Personal history of malignant neoplasm of breast: Secondary | ICD-10-CM

## 2024-10-14 DIAGNOSIS — I480 Paroxysmal atrial fibrillation: Secondary | ICD-10-CM | POA: Diagnosis present

## 2024-10-14 DIAGNOSIS — Z1152 Encounter for screening for COVID-19: Secondary | ICD-10-CM

## 2024-10-14 DIAGNOSIS — Z8249 Family history of ischemic heart disease and other diseases of the circulatory system: Secondary | ICD-10-CM

## 2024-10-14 DIAGNOSIS — Z860100 Personal history of colon polyps, unspecified: Secondary | ICD-10-CM

## 2024-10-14 DIAGNOSIS — K8689 Other specified diseases of pancreas: Secondary | ICD-10-CM | POA: Diagnosis not present

## 2024-10-14 DIAGNOSIS — C259 Malignant neoplasm of pancreas, unspecified: Secondary | ICD-10-CM | POA: Diagnosis not present

## 2024-10-14 DIAGNOSIS — Z886 Allergy status to analgesic agent status: Secondary | ICD-10-CM

## 2024-10-14 DIAGNOSIS — G8194 Hemiplegia, unspecified affecting left nondominant side: Secondary | ICD-10-CM | POA: Diagnosis present

## 2024-10-14 DIAGNOSIS — D259 Leiomyoma of uterus, unspecified: Secondary | ICD-10-CM | POA: Diagnosis not present

## 2024-10-14 DIAGNOSIS — M545 Low back pain, unspecified: Secondary | ICD-10-CM | POA: Diagnosis not present

## 2024-10-14 DIAGNOSIS — R109 Unspecified abdominal pain: Secondary | ICD-10-CM | POA: Diagnosis not present

## 2024-10-14 DIAGNOSIS — G9340 Encephalopathy, unspecified: Secondary | ICD-10-CM | POA: Insufficient documentation

## 2024-10-14 DIAGNOSIS — R410 Disorientation, unspecified: Secondary | ICD-10-CM | POA: Diagnosis not present

## 2024-10-14 DIAGNOSIS — D735 Infarction of spleen: Secondary | ICD-10-CM | POA: Diagnosis present

## 2024-10-14 DIAGNOSIS — M549 Dorsalgia, unspecified: Secondary | ICD-10-CM | POA: Diagnosis present

## 2024-10-14 DIAGNOSIS — I10 Essential (primary) hypertension: Secondary | ICD-10-CM | POA: Diagnosis not present

## 2024-10-14 DIAGNOSIS — Z0389 Encounter for observation for other suspected diseases and conditions ruled out: Secondary | ICD-10-CM | POA: Diagnosis not present

## 2024-10-14 DIAGNOSIS — K769 Liver disease, unspecified: Secondary | ICD-10-CM | POA: Diagnosis not present

## 2024-10-14 DIAGNOSIS — C787 Secondary malignant neoplasm of liver and intrahepatic bile duct: Secondary | ICD-10-CM | POA: Diagnosis present

## 2024-10-14 DIAGNOSIS — Z79899 Other long term (current) drug therapy: Secondary | ICD-10-CM

## 2024-10-14 DIAGNOSIS — Z800 Family history of malignant neoplasm of digestive organs: Secondary | ICD-10-CM

## 2024-10-14 DIAGNOSIS — R451 Restlessness and agitation: Secondary | ICD-10-CM | POA: Diagnosis not present

## 2024-10-14 DIAGNOSIS — I21A1 Myocardial infarction type 2: Secondary | ICD-10-CM | POA: Diagnosis present

## 2024-10-14 DIAGNOSIS — I619 Nontraumatic intracerebral hemorrhage, unspecified: Secondary | ICD-10-CM | POA: Diagnosis not present

## 2024-10-14 DIAGNOSIS — R531 Weakness: Secondary | ICD-10-CM

## 2024-10-14 DIAGNOSIS — R233 Spontaneous ecchymoses: Secondary | ICD-10-CM | POA: Diagnosis not present

## 2024-10-14 DIAGNOSIS — R7989 Other specified abnormal findings of blood chemistry: Secondary | ICD-10-CM | POA: Diagnosis not present

## 2024-10-14 DIAGNOSIS — Z8673 Personal history of transient ischemic attack (TIA), and cerebral infarction without residual deficits: Secondary | ICD-10-CM

## 2024-10-14 DIAGNOSIS — E785 Hyperlipidemia, unspecified: Secondary | ICD-10-CM | POA: Diagnosis present

## 2024-10-14 LAB — COMPREHENSIVE METABOLIC PANEL WITH GFR
ALT: 47 U/L — ABNORMAL HIGH (ref 0–44)
AST: 48 U/L — ABNORMAL HIGH (ref 15–41)
Albumin: 3.5 g/dL (ref 3.5–5.0)
Alkaline Phosphatase: 151 U/L — ABNORMAL HIGH (ref 38–126)
Anion gap: 15 (ref 5–15)
BUN: 10 mg/dL (ref 8–23)
CO2: 20 mmol/L — ABNORMAL LOW (ref 22–32)
Calcium: 9.3 mg/dL (ref 8.9–10.3)
Chloride: 104 mmol/L (ref 98–111)
Creatinine, Ser: 0.8 mg/dL (ref 0.44–1.00)
GFR, Estimated: >60 mL/min (ref >60)
Glucose, Bld: 110 mg/dL — ABNORMAL HIGH (ref 70–99)
Potassium: 3.7 mmol/L (ref 3.5–5.1)
Sodium: 139 mmol/L (ref 135–145)
Total Bilirubin: 1.2 mg/dL (ref 0.0–1.2)
Total Protein: 6.9 g/dL (ref 6.5–8.1)

## 2024-10-14 LAB — RESP PANEL BY RT-PCR (RSV, FLU A&B, COVID)  RVPGX2
Influenza A by PCR: NEGATIVE
Influenza B by PCR: NEGATIVE
Resp Syncytial Virus by PCR: NEGATIVE
SARS Coronavirus 2 by RT PCR: NEGATIVE

## 2024-10-14 LAB — PROTIME-INR
INR: 2.1 — ABNORMAL HIGH (ref 0.8–1.2)
Prothrombin Time: 25 s — ABNORMAL HIGH (ref 11.4–15.2)

## 2024-10-14 LAB — URINALYSIS, W/ REFLEX TO CULTURE (INFECTION SUSPECTED)
Bilirubin Urine: NEGATIVE
Glucose, UA: NEGATIVE mg/dL
Hgb urine dipstick: NEGATIVE
Ketones, ur: 5 mg/dL — AB
Nitrite: NEGATIVE
Protein, ur: NEGATIVE mg/dL
Specific Gravity, Urine: 1.018 (ref 1.005–1.030)
pH: 6 (ref 5.0–8.0)

## 2024-10-14 LAB — CBC WITH DIFFERENTIAL/PLATELET
Abs Immature Granulocytes: 0.03 K/uL (ref 0.00–0.07)
Basophils Absolute: 0.1 K/uL (ref 0.0–0.1)
Basophils Relative: 1 %
Eosinophils Absolute: 0.1 K/uL (ref 0.0–0.5)
Eosinophils Relative: 1 %
HCT: 42 % (ref 36.0–46.0)
Hemoglobin: 14.3 g/dL (ref 12.0–15.0)
Immature Granulocytes: 0 %
Lymphocytes Relative: 14 %
Lymphs Abs: 1.4 K/uL (ref 0.7–4.0)
MCH: 30.4 pg (ref 26.0–34.0)
MCHC: 34 g/dL (ref 30.0–36.0)
MCV: 89.4 fL (ref 80.0–100.0)
Monocytes Absolute: 0.8 K/uL (ref 0.1–1.0)
Monocytes Relative: 8 %
Neutro Abs: 7.4 K/uL (ref 1.7–7.7)
Neutrophils Relative %: 76 %
Platelets: 125 K/uL — ABNORMAL LOW (ref 150–400)
RBC: 4.7 MIL/uL (ref 3.87–5.11)
RDW: 13.3 % (ref 11.5–15.5)
WBC: 9.8 K/uL (ref 4.0–10.5)
nRBC: 0 % (ref 0.0–0.2)

## 2024-10-14 LAB — I-STAT CG4 LACTIC ACID, ED: Lactic Acid, Venous: 0.9 mmol/L (ref 0.5–1.9)

## 2024-10-14 LAB — I-STAT CHEM 8, ED
BUN: 11 mg/dL (ref 8–23)
Calcium, Ion: 1.19 mmol/L (ref 1.15–1.40)
Chloride: 103 mmol/L (ref 98–111)
Creatinine, Ser: 0.8 mg/dL (ref 0.44–1.00)
Glucose, Bld: 119 mg/dL — ABNORMAL HIGH (ref 70–99)
HCT: 43 % (ref 36.0–46.0)
Hemoglobin: 14.6 g/dL (ref 12.0–15.0)
Potassium: 3.7 mmol/L (ref 3.5–5.1)
Sodium: 140 mmol/L (ref 135–145)
TCO2: 23 mmol/L (ref 22–32)

## 2024-10-14 LAB — LIPASE, BLOOD: Lipase: 23 U/L (ref 11–51)

## 2024-10-14 LAB — TROPONIN I (HIGH SENSITIVITY)
Troponin I (High Sensitivity): 1797 ng/L (ref <18)
Troponin I (High Sensitivity): 2690 ng/L (ref <18)
Troponin I (High Sensitivity): 2529 ng/L (ref <18)

## 2024-10-14 LAB — CBG MONITORING, ED: Glucose-Capillary: 128 mg/dL — ABNORMAL HIGH (ref 70–99)

## 2024-10-14 LAB — AMMONIA: Ammonia: <13 umol/L (ref 9–35)

## 2024-10-14 MED ORDER — HYDROCODONE-ACETAMINOPHEN 5-325 MG PO TABS
1.0000 | ORAL_TABLET | Freq: Four times a day (QID) | ORAL | Status: DC | PRN
Start: 2024-10-14 — End: 2024-10-15
  Administered 2024-10-14: 1 via ORAL
  Filled 2024-10-14: qty 1

## 2024-10-14 MED ORDER — HALOPERIDOL LACTATE 5 MG/ML IJ SOLN
2.0000 mg | Freq: Once | INTRAMUSCULAR | Status: AC
  Administered 2024-10-14: 2 mg via INTRAVENOUS
  Filled 2024-10-14: qty 1

## 2024-10-14 MED ORDER — IOHEXOL 350 MG/ML SOLN
75.0000 mL | Freq: Once | INTRAVENOUS | Status: AC | PRN
  Administered 2024-10-14: 75 mL via INTRAVENOUS

## 2024-10-14 MED ORDER — SIMVASTATIN 20 MG PO TABS
40.0000 mg | ORAL_TABLET | Freq: Every day | ORAL | Status: DC
  Administered 2024-10-14 – 2024-10-18 (×5): 40 mg via ORAL
  Filled 2024-10-14 (×5): qty 2

## 2024-10-14 MED ORDER — ACETAMINOPHEN 650 MG RE SUPP: 650.0000 mg | Freq: Four times a day (QID) | RECTAL | Status: DC | PRN

## 2024-10-14 MED ORDER — ACETAMINOPHEN 325 MG PO TABS
650.0000 mg | ORAL_TABLET | Freq: Four times a day (QID) | ORAL | Status: DC | PRN
Start: 2024-10-14 — End: 2024-10-20
  Administered 2024-10-17 – 2024-10-19 (×2): 650 mg via ORAL
  Filled 2024-10-14 (×2): qty 2

## 2024-10-14 MED ORDER — SODIUM CHLORIDE 0.9% FLUSH
3.0000 mL | Freq: Two times a day (BID) | INTRAVENOUS | Status: DC
  Administered 2024-10-14 – 2024-10-19 (×10): 3 mL via INTRAVENOUS

## 2024-10-14 MED ORDER — FENTANYL CITRATE (PF) 50 MCG/ML IJ SOSY
50.0000 ug | PREFILLED_SYRINGE | Freq: Once | INTRAMUSCULAR | Status: AC
  Administered 2024-10-14: 50 ug via INTRAVENOUS
  Filled 2024-10-14 (×2): qty 1

## 2024-10-14 MED ORDER — GADOBUTROL 1 MMOL/ML IV SOLN: 7.0000 mL | Freq: Once | INTRAVENOUS | Status: DC | PRN

## 2024-10-14 MED ORDER — LACTATED RINGERS IV BOLUS (SEPSIS)
1000.0000 mL | Freq: Once | INTRAVENOUS | Status: AC
Start: 2024-10-14 — End: 2024-10-14
  Administered 2024-10-14: 1000 mL via INTRAVENOUS

## 2024-10-14 NOTE — H&P (Signed)
 History and Physical   [name] [name] FMW:[id number] DOB: 06/17/1946 DOA: 10/14/2024  PCP: Ozell Heron [name], MD   Patient coming from: Home  Chief Complaint: Acute encephalopathy  HPI: [name] [name] is a 78 y.o. female with medical history significant of hyperlipidemia, CVA, breast cancer presenting with worsening mentation.  History attained with assistance of chart review.  Patient was admitted 11/16-11/19 with right-sided deficits and found to have CVA suspicious for embolic etiology.  Patient was on Xarelto  and on discharge was counseled on taking it the same way every day with food.  Also working on discharge to get patient PSC K9 inhibitor approval due to prior adverse effects from statins.  Patient discharged 5 days ago and for the past 2 days family has noticed decreased activity, decreased p.o. intake, weakness, confusion.  Reportedly arrived significantly confused to the ED but reportedly improved somewhat with IV fluids.  However on my evaluation she is significantly confused.  Patient unable to participate in review of systems due to altered mentation.  I was able to speak with patient's daughter, [name], by phone. Daughter was updated with patient's current status with new small bleed in the brain, new pancreatic mass and liver lesions, new renal and splenic infarcts.  Concern for hypercoagulability secondary to mass however unable to continue with anticoagulation at this time due to concern for possible risk of worsening brain bleed.  Also noted concern for NSTEMI with increasing troponin.  We discussed that some of her treatment options are limited at this time and may continue to be.  Family agrees that patient should be DNR/DNI.  I discussed consulting palliative medicine to begin conversations goals of care, prognosis, options going forward while we continue to workup and treat patient for now.   ED Course: Vital signs in the ED notable for blood pressure in the 150s-160s  systolic.  Heart rate in the 80s-100s.  Lab workup included CMP with bicarb 20, glucose 116, AST 48, ALT 47, alk phos 151.  CBC with platelets 125.  PT 25, INR 2.1.  Troponin elevated to 1797 with repeat pending.  Lipase pending.  BNP pending.  Lactic acid pending.  Respiratory panel for flu COVID and RSV negative.  Ammonia level normal.  Urinalysis with trace leukocytes and rare bacteria only.  Blood cultures pending.  Chest x-ray showed no acute normality.  CTA PE study showed no PE and no other acute abnormality of the chest.  CT head shows changes consistent with recent infarctions but also showed new focal petechial hemorrhage.  CT of the abdomen pelvis showed 1.5 x 2 cm hypoenhancing mass of the pancreas consistent with neoplasm.  Also noted multiple liver lesions consistent with metastases.  Additionally, changes consistent with splenic and renal infarcts were noted as well as changes of the right kidney that could be consistent with infarct versus pyelonephritis.  Diverticulosis also noted.  MRI brain ordered and is pending.  Patient received fentanyl  and 1 L IV fluids in the ED.  Neurology consulted recommended MRI with and without contrast to further evaluate this new bleed in the setting of suspected pancreatic neoplasm.  Also recommended consultation with palliative care.  Review of Systems: Patient unable to participate in review of systems due to altered mentation.  Past Medical History:  Diagnosis Date   Anxiety    Arthritis    Atrial fibrillation (HCC)    Benign colon polyp    Cancer (HCC) 2012   breast- left   Complication of anesthesia  Diverticulosis    Dysrhythmia    History of breast cancer    s/p lumpectomy and HRT   Hyperlipidemia    intolerant of all cholesterol meds   Obesity    Palpitations    PONV (postoperative nausea and vomiting)    Rheumatoid factor positive 02/09/2020    Past Surgical History:  Procedure Laterality Date   ABDOMINAL HYSTERECTOMY   partial   per pt she still has uterus   BREAST BIOPSY Right 2011   benign   BREAST BIOPSY  10/22/2022   MM RT RADIOACTIVE SEED LOC MAMMO GUIDE 10/22/2022 [facility]   BREAST LUMPECTOMY  07/26/2007   Left lumpectomy+sln,ER+PR-,Her2-,T1bN0   BREAST LUMPECTOMY WITH RADIOACTIVE SEED LOCALIZATION Right 10/23/2022   Procedure: RIGHT BREAST LUMPECTOMY WITH RADIOACTIVE SEED LOCALIZATION;  Surgeon: Curvin Deward MOULD, MD;  Location: [facility];  Service: General;  Laterality: Right;   CHOLECYSTECTOMY     CP : ETT 10/11     Medical History. Walked 630 on [place] protocl. normal ECG   CT RADIATION THERAPY GUIDE     KNEE ARTHROSCOPY     left 2004; right 2014   Left Knee Score     OOPHORECTOMY  2008   scalp surgery  2012   cylindromas- trichoepethliomas   TOTAL ABDOMINAL HYSTERECTOMY W/ BILATERAL SALPINGOOPHORECTOMY      Social History  reports that she has never smoked. She has never used smokeless tobacco. She reports that she does not currently use alcohol after a past usage of about 1.0 standard drink of alcohol per week. She reports that she does not use drugs.  Allergies  Allergen Reactions   Naproxen Sodium Hives   Neomycin-Bacitracin Zn-Polymyx Swelling    In ear, where it was applied.    Tetracycline Rash    All over the body   Apixaban Hives   Crestor [Rosuvastatin] Other (See Comments)    Joint pain   Lipitor  [Atorvastatin ] Other (See Comments)    Joint pain and weakness.    Advil [Ibuprofen] Rash   Other Rash    ALL TAPES-RASH (PT HAS LEAST RXN TO PAPER TAPE)    Family History  Problem Relation Age of Onset   Cancer Mother        liver   Cancer Father        bone   Cancer Maternal Uncle        colon   Coronary artery disease Neg Hx   Reviewed on admission  Prior to Admission medications   Medication Sig Start Date End Date Taking? Authorizing Provider  acetaminophen  (TYLENOL ) 500 MG tablet Take 500 mg by mouth every 6 (six) hours as needed for  moderate pain.    [provider]  Ascorbic Acid  (VITAMIN C ) 1000 MG tablet Take 1,000 mg by mouth daily.    Ozell Heron HERO, MD  b complex vitamins capsule Take 1 capsule by mouth every other day.    Ozell Heron HERO, MD  diltiazem  (CARDIZEM ) 30 MG tablet Take 1 tablet every 4 hours AS NEEDED for heart rate >100 as long as top blood pressure >100. Patient not taking: Reported on 10/11/2024 04/14/24   Ozell Heron HERO, MD  magnesium  oxide (MAG-OX) 400 (240 Mg) MG tablet Take 400 mg by mouth daily.    Ozell Heron HERO, MD  Moringa Oleifera (MORINGA PO) Take 1 g by mouth daily.    [provider]  Multiple Vitamin (MULTIVITAMIN) tablet Take 1 tablet by mouth daily.    Ozell,  Heron HERO, MD  OVER THE COUNTER MEDICATION Cholesterol support-nightly    [provider]  OVER THE COUNTER MEDICATION Energize Electrolytes-1 scoop daily    [provider]  OVER THE COUNTER MEDICATION Omegas with Turmeric-daily    [provider]  OVER THE COUNTER MEDICATION Collagen    [provider]  OVER THE COUNTER MEDICATION Take 2,700 mg by mouth daily.    [provider]  rivaroxaban  (XARELTO ) 20 MG TABS tablet Take 1 tablet (20 mg total) by mouth daily with supper. Take with food. Start after completion of starter pack. Patient taking differently: Take 1 tablet (20 mg total) by mouth daily with supper. Take with food. Start after completion of starter pack. 09/06/24   Barbarann Dixon B, RPH-CPP  simvastatin  (ZOCOR ) 40 MG tablet Take 1 tablet (40 mg total) by mouth at bedtime. 10/11/24   Ozell Heron HERO, MD  triamcinolone  cream (KENALOG ) 0.1 % Apply 1 Application topically 2 (two) times daily. 03/14/24   Ozell Heron HERO, MD  Vitamin D -Vitamin K (VITAMIN K2-VITAMIN D3 PO) Take 1 tablet by mouth daily.    [provider]    Physical Exam: Vitals:   10/14/24 1445 10/14/24 1500 10/14/24 1515 10/14/24 1736  BP: (!) 159/78 (!) 160/80  (!) 165/80 (!) 172/91  Pulse: 78 79 82 98  Resp: 15 18 13 16   Temp:    98.6 F (37 C)  TempSrc:    Oral  SpO2: 100% 99% 100% 100%  Weight:      Height:        Physical Exam Constitutional:      General: She is not in acute distress.    Appearance: Normal appearance.  HENT:     Head: Normocephalic and atraumatic.     Mouth/Throat:     Mouth: Mucous membranes are moist.     Pharynx: Oropharynx is clear.  Eyes:     Extraocular Movements: Extraocular movements intact.     Pupils: Pupils are equal, round, and reactive to light.  Cardiovascular:     Rate and Rhythm: Normal rate and regular rhythm.     Pulses: Normal pulses.     Heart sounds: Normal heart sounds.  Pulmonary:     Effort: Pulmonary effort is normal. No respiratory distress.     Breath sounds: Normal breath sounds.  Abdominal:     General: Bowel sounds are normal. There is no distension.     Palpations: Abdomen is soft.     Tenderness: There is no abdominal tenderness.  Musculoskeletal:        General: No swelling or deformity.  Skin:    General: Skin is warm and dry.  Neurological:     General: No focal deficit present.     Mental Status: Mental status is at baseline.     Comments: Mental Status: Patient is awake and disoriented. Unable to follow commands well enough to participate in full neurologic exam. Moving all 4 extremities spontaneously    Labs on Admission: I have personally reviewed following labs and imaging studies  CBC: Recent Labs  Lab 10/08/24 0911 10/08/24 1858 10/14/24 1353 10/14/24 1407  WBC 5.1 6.5 9.8  --   NEUTROABS 3.0  --  7.4  --   HGB 13.5 13.5 14.3 14.6  HCT 40.4 41.3 42.0 43.0  MCV 92.4 93.0 89.4  --   PLT 210 201 125*  --     Basic Metabolic Panel: Recent Labs  Lab 10/08/24 0911 10/08/24 1858 10/14/24 1353  10/14/24 1407  NA 141 140 139 140  K 3.8 4.0 3.7 3.7  CL 107 106 104 103  CO2 23 23 20*  --   GLUCOSE 111* 92 110* 119*  BUN 16 14 10 11   CREATININE  0.74 0.78 0.80 0.80  CALCIUM  9.1 9.2 9.3  --     GFR: Estimated Creatinine Clearance: 56.2 mL/min (by C-G formula based on SCr of 0.8 mg/dL).  Liver Function Tests: Recent Labs  Lab 10/08/24 0911 10/08/24 1858 10/14/24 1353  AST 34 31 48*  ALT 36 36 47*  ALKPHOS 145* 142* 151*  BILITOT 1.1 1.2 1.2  PROT 6.7 6.8 6.9  ALBUMIN 3.7 3.7 3.5    Urine analysis:    Component Value Date/Time   COLORURINE YELLOW 10/14/2024 1611   APPEARANCEUR CLEAR 10/14/2024 1611   LABSPEC 1.018 10/14/2024 1611   PHURINE 6.0 10/14/2024 1611   GLUCOSEU NEGATIVE 10/14/2024 1611   HGBUR NEGATIVE 10/14/2024 1611   BILIRUBINUR NEGATIVE 10/14/2024 1611   KETONESUR 5 (A) 10/14/2024 1611   PROTEINUR NEGATIVE 10/14/2024 1611   NITRITE NEGATIVE 10/14/2024 1611   LEUKOCYTESUR TRACE (A) 10/14/2024 1611    Radiological Exams on Admission: CT Head Wo Contrast Result Date: 10/14/2024 CLINICAL DATA:  Altered level of consciousness EXAM: CT HEAD WITHOUT CONTRAST TECHNIQUE: Contiguous axial images were obtained from the base of the skull through the vertex without intravenous contrast. RADIATION DOSE REDUCTION: This exam was performed according to the departmental dose-optimization program which includes automated exposure control, adjustment of the mA and/or kV according to patient size and/or use of iterative reconstruction technique. COMPARISON:  10/08/2024 FINDINGS: Brain: There are scattered cortical hypodensities compatible with the multifocal acute infarcts seen on recent MRI. These are most pronounced within the right cerebellar hemisphere and left frontal parietal cortex. Within the left frontal parietal cortical infarct image 26/5 there is a 3 mm focus of petechial hemorrhage which has developed in the interim. No new areas of infarct are identified. The lateral ventricles and midline structures are otherwise unremarkable. No mass effect. Vascular: Stable atherosclerosis.  No hyperdense vessel. Skull: Multiple  hyperdense cutaneous lesions are again identified, which may reflect numerous sebaceous cyst or neurofibromas. The calvarium is unremarkable. Sinuses/Orbits: Mild polypoid mucosal thickening within the right maxillary sinus. Remaining paranasal sinuses are clear. Other: None. IMPRESSION: 1. Scattered hypodensities within the bilateral cerebral cortices and right cerebellar hemisphere compatible with known acute cortical infarcts identified on the 10/08/2024 MRI. 2. Focal petechial hemorrhage within the left frontal parietal cortical infarct bed as above, measuring 3 mm. No mass effect. Critical Value/emergent results were called by telephone at the time of interpretation on 10/14/2024 at 3:52 pm to provider [facility] , who verbally acknowledged these results. Electronically Signed   By: Ozell Daring M.D.   On: 10/14/2024 15:57   CT ABDOMEN PELVIS W CONTRAST Result Date: 10/14/2024 CLINICAL DATA:  Altered mental status. Concern for pulmonary disease. Abdominal pain. EXAM: CT ANGIOGRAPHY CHEST CT ABDOMEN AND PELVIS WITH CONTRAST TECHNIQUE: Multidetector CT imaging of the chest was performed using the standard protocol during bolus administration of intravenous contrast. Multiplanar CT image reconstructions and MIPs were obtained to evaluate the vascular anatomy. Multidetector CT imaging of the abdomen and pelvis was performed using the standard protocol during bolus administration of intravenous contrast. RADIATION DOSE REDUCTION: This exam was performed according to the departmental dose-optimization program which includes automated exposure control, adjustment of the mA and/or kV according to patient size and/or use of iterative reconstruction technique. CONTRAST:  75mL OMNIPAQUE  IOHEXOL  350 MG/ML SOLN COMPARISON:  Chest radiograph dated 10/14/2024. FINDINGS: CTA CHEST FINDINGS Cardiovascular: There is no cardiomegaly or pericardial effusion. Mild atherosclerotic calcification of the thoracic aorta. No  aneurysmal dilatation or dissection. The origins of the great vessels of the aortic arch appear patent. No pulmonary artery embolus identified. Mediastinum/Nodes: No hilar or mediastinal adenopathy. The esophagus is grossly unremarkable. No mediastinal fluid collection. Lungs/Pleura: Faint diffuse hazy density throughout the lungs with mosaic attenuation may represent areas of air trapping and related to underlying small airway versus small vessel disease. No focal consolidation, pleural effusion, pneumothorax. The central airways are patent. Musculoskeletal: Degenerative changes of spine. No acute osseous pathology. Review of the MIP images confirms the above findings. CT ABDOMEN and PELVIS FINDINGS No intra-abdominal free air or free fluid. Hepatobiliary: Multiple hepatic hypoenhancing lesions measure up to 18 mm in the dome of the liver consistent with metastatic disease. Liver abscesses are less likely. Mild biliary dilatation, post cholecystectomy. Pancreas: There is a 1.5 x 2 cm hypoenhancing mass in the body of the pancreas consistent with primary pancreatic neoplasm. There is atrophy of the distal body of the pancreas. Spleen: Wedge shaped area of hypoenhancement involving the upper pole of the spleen most consistent with infarct. Adrenals/Urinary Tract: The adrenal glands unremarkable. Bilateral renal wedge-shaped hypoenhancing areas suspicious for infarct. An area of hypoenhancement involving the inferior pole of the right kidney may represent infarct or pyelonephritis. Correlation with urinalysis recommended. There is no hydronephrosis on either side. There is symmetric excretion of contrast by both kidneys. The visualized ureters and urinary bladder appear unremarkable. Stomach/Bowel: There is sigmoid diverticulosis. There is no bowel obstruction or active inflammation. The appendix is normal. Vascular/Lymphatic: Moderate aortoiliac atherosclerotic disease. The IVC is unremarkable. No portal venous gas.  There is no adenopathy. Reproductive: The uterus is retroverted or retroflexed. Anterior uterine fibroid noted. No suspicious adnexal masses. Other: None Musculoskeletal: Osteopenia with degenerative changes spine. No acute osseous pathology. Review of the MIP images confirms the above findings. IMPRESSION: 1. No acute intrathoracic pathology. No pulmonary artery embolus identified. 2. A 1.5 x 2 cm hypoenhancing mass in the body of the pancreas consistent with primary pancreatic neoplasm. 3. Multiple hepatic hypoenhancing lesions consistent with metastatic disease. 4. Splenic and bilateral renal wedge-shaped hypoenhancing areas suspicious for infarcts. An area of hypoenhancement involving the inferior pole of the right kidney may represent infarct or pyelonephritis. Correlation with urinalysis recommended. 5. Sigmoid diverticulosis. No bowel obstruction. Normal appendix. 6.  Aortic Atherosclerosis (ICD10-I70.0). Electronically Signed   By: Vanetta Chou M.D.   On: 10/14/2024 15:51   CT Angio Chest PE W and/or Wo Contrast Result Date: 10/14/2024 CLINICAL DATA:  Altered mental status. Concern for pulmonary disease. Abdominal pain. EXAM: CT ANGIOGRAPHY CHEST CT ABDOMEN AND PELVIS WITH CONTRAST TECHNIQUE: Multidetector CT imaging of the chest was performed using the standard protocol during bolus administration of intravenous contrast. Multiplanar CT image reconstructions and MIPs were obtained to evaluate the vascular anatomy. Multidetector CT imaging of the abdomen and pelvis was performed using the standard protocol during bolus administration of intravenous contrast. RADIATION DOSE REDUCTION: This exam was performed according to the departmental dose-optimization program which includes automated exposure control, adjustment of the mA and/or kV according to patient size and/or use of iterative reconstruction technique. CONTRAST:  75mL OMNIPAQUE  IOHEXOL  350 MG/ML SOLN COMPARISON:  Chest radiograph dated  10/14/2024. FINDINGS: CTA CHEST FINDINGS Cardiovascular: There is no cardiomegaly or pericardial effusion. Mild atherosclerotic calcification of the thoracic aorta. No aneurysmal  dilatation or dissection. The origins of the great vessels of the aortic arch appear patent. No pulmonary artery embolus identified. Mediastinum/Nodes: No hilar or mediastinal adenopathy. The esophagus is grossly unremarkable. No mediastinal fluid collection. Lungs/Pleura: Faint diffuse hazy density throughout the lungs with mosaic attenuation may represent areas of air trapping and related to underlying small airway versus small vessel disease. No focal consolidation, pleural effusion, pneumothorax. The central airways are patent. Musculoskeletal: Degenerative changes of spine. No acute osseous pathology. Review of the MIP images confirms the above findings. CT ABDOMEN and PELVIS FINDINGS No intra-abdominal free air or free fluid. Hepatobiliary: Multiple hepatic hypoenhancing lesions measure up to 18 mm in the dome of the liver consistent with metastatic disease. Liver abscesses are less likely. Mild biliary dilatation, post cholecystectomy. Pancreas: There is a 1.5 x 2 cm hypoenhancing mass in the body of the pancreas consistent with primary pancreatic neoplasm. There is atrophy of the distal body of the pancreas. Spleen: Wedge shaped area of hypoenhancement involving the upper pole of the spleen most consistent with infarct. Adrenals/Urinary Tract: The adrenal glands unremarkable. Bilateral renal wedge-shaped hypoenhancing areas suspicious for infarct. An area of hypoenhancement involving the inferior pole of the right kidney may represent infarct or pyelonephritis. Correlation with urinalysis recommended. There is no hydronephrosis on either side. There is symmetric excretion of contrast by both kidneys. The visualized ureters and urinary bladder appear unremarkable. Stomach/Bowel: There is sigmoid diverticulosis. There is no bowel  obstruction or active inflammation. The appendix is normal. Vascular/Lymphatic: Moderate aortoiliac atherosclerotic disease. The IVC is unremarkable. No portal venous gas. There is no adenopathy. Reproductive: The uterus is retroverted or retroflexed. Anterior uterine fibroid noted. No suspicious adnexal masses. Other: None Musculoskeletal: Osteopenia with degenerative changes spine. No acute osseous pathology. Review of the MIP images confirms the above findings. IMPRESSION: 1. No acute intrathoracic pathology. No pulmonary artery embolus identified. 2. A 1.5 x 2 cm hypoenhancing mass in the body of the pancreas consistent with primary pancreatic neoplasm. 3. Multiple hepatic hypoenhancing lesions consistent with metastatic disease. 4. Splenic and bilateral renal wedge-shaped hypoenhancing areas suspicious for infarcts. An area of hypoenhancement involving the inferior pole of the right kidney may represent infarct or pyelonephritis. Correlation with urinalysis recommended. 5. Sigmoid diverticulosis. No bowel obstruction. Normal appendix. 6.  Aortic Atherosclerosis (ICD10-I70.0). Electronically Signed   By: Vanetta Chou M.D.   On: 10/14/2024 15:51   DG Chest Port 1 View Result Date: 10/14/2024 CLINICAL DATA:  Questionable sepsis - evaluate for abnormality EXAM: PORTABLE CHEST 1 VIEW COMPARISON:  May 06, 2022 FINDINGS: The cardiomediastinal silhouette is unchanged in contour.Atherosclerotic calcifications. No pleural effusion. No pneumothorax. No acute pleuroparenchymal abnormality. IMPRESSION: No acute cardiopulmonary abnormality. Electronically Signed   By: Corean Salter M.D.   On: 10/14/2024 14:10   EKG: Independently reviewed.  Sinus rhythm at 86 bpm.  Nonspecific T wave changes.  Minimal baseline wander.  T waves more flat than previous.  Assessment/Plan Active Problems:   HLD (hyperlipidemia)   Paroxysmal atrial fibrillation (HCC)   Acute encephalopathy   Pancreatic mass   Renal  infarct   Splenic infarct   Intracerebral hemorrhage (HCC)   Intracerebral hemorrhage Acute encephalopathy > Patient presenting with confusion, weakness, decreased p.o. intake, decreased activity for the past 2 days after recent admission for stroke believed to be secondary to embolism. > CT head with changes consistent with prior infarction but also new focal petechial hemorrhage. > CT abdomen pelvis showed changes consistent with pancreatic neoplasm, liver metastases, splenic and  renal infarcts. > EDP discussed case with neurology who recommended MRI brain with and without contrast to evaluate for repeat infarction, further evaluate bleeding, rule out brain metastasis in the setting of suspected pancreatic neoplasm. - Monitor on progressive unit overnight - Follow-up MRI brain - Appreciate neurology recommendations and assistance  LFT elevations New pancreatic mass > Noted to have 1.5 x 2 cm mass of the pancreas on CT consistent with neoplasm.  Also noted to have multiple liver lesions consistent with metastases. > LFTs mildly elevated likely consistent with the liver lesions. > Recent infarcts while on anticoagulation could be secondary to hypercoagulability in the setting of neoplasm. > MRI brain with and without contrast to evaluate for brain metastases. - Monitoring on progressive unit as above - Palliative medicine consult  Splenic infarct Renal infarct > Noted on CT.  Likely secondary to hypercoagulability in the setting of pancreatic neoplasm.  Has been on Xarelto .  Recently admitted for CVA suspected to be embolic. - Anticoagulation will depend on results of MRI brain as we do not want to worsen any intracerebral hemorrhage  NSTEMI > Suspected demand ischemia.  Troponin elevated to 1797, repeat pending. > In the setting of intracerebral hemorrhage, encephalopathy, new pancreatic mass, multiple infarcts this may represent demand ischemia. > Patient not reporting chest pain and  may not be a good candidate for intervention. - Will continue to trend troponin - Consider cardiology consult pending course ADDENDUM > Repeat troponin came back further elevated at 2690.  EDP has consulted cardiology.  Currently not a candidate for intervention nor anticoagulation. - Appreciate cardiology recommendations and assistance  Hyperlipidemia - Continue simvastatin   History of recent CVA - Continue simvastatin  - Holding Xarelto  as above  History of breast cancer - Noted  DVT prophylaxis: SCDs for now Code Status:   DNR/DNI, discussed with family given patient's altered mentation. Family Communication:  Updated by phone Disposition Plan:   Patient is from:  Home  Anticipated DC to:  Pending clinical course  Anticipated DC date:  2 to 5 days  Anticipated DC barriers: None  Consults called:  Neurology Admission status:  Inpatient, progressive  Severity of Illness: The appropriate patient status for this patient is INPATIENT. Inpatient status is judged to be reasonable and necessary in order to provide the required intensity of service to ensure the patient's safety. The patient's presenting symptoms, physical exam findings, and initial radiographic and laboratory data in the context of their chronic comorbidities is felt to place them at high risk for further clinical deterioration. Furthermore, it is not anticipated that the patient will be medically stable for discharge from the hospital within 2 midnights of admission.   * I certify that at the point of admission it is my clinical judgment that the patient will require inpatient hospital care spanning beyond 2 midnights from the point of admission due to high intensity of service, high risk for further deterioration and high frequency of surveillance required.DEWAINE Marsa KATHEE Seena MD [facility]  How to contact the [facility] Attending or Consulting provider 7A - 7P or covering provider during after hours 7P -7A, for  this patient?   Check the care team in [facility] and look for a) attending/consulting [facility] provider listed and b) the [facility] team listed Log into [email or web address] and use [facility] universal password to access. If you do not have the password, please contact the hospital operator. Locate the [facility] provider you are looking for under [facility] and page to a number  that you can be directly reached. If you still have difficulty reaching the provider, please page the [facility] (Director on Call) for the Hospitalists listed on amion for assistance.  10/14/2024, 5:42 PM

## 2024-10-14 NOTE — ED Triage Notes (Addendum)
 Pt to er via ems, per ems pt was seen on Sunday and was dx with a stroke, states that she was d/c on Tuesday, states that since she has been home she has had a decrease in mental status and mobility.  MD at bedside.  Pt responds to voice, pt oriented to self.  Re oriented pt.  Pt denies pain. Pt denies any new weakness.

## 2024-10-14 NOTE — ED Notes (Signed)
 Date and time results received: 10/14/24 1528 (use smartphrase .now to insert current time)  Test: Trop  Critical Value: 1797  Name of Provider Notified: Melvenia

## 2024-10-14 NOTE — Consult Note (Addendum)
 Cardiology Consultation   Patient ID: [name] [name] MRN: [id number]; DOB: 22-May-1946  Admit date: 10/14/2024 Date of Consult: 10/14/2024  PCP:  Ozell Heron [name], MD   [facility] Providers Cardiologist:  None     Patient Profile: [name] [name] is a 78 y.o. female with a hx of recent embolic CVA (multiple scattered acute infarcts in the bilateral cerebral hemispheres and poster inferior right cerebellum), htn, afib, breast cancer, hld who is being seen 10/14/2024 for the evaluation of elevated troponin at the request of Dr. Melvenia.  History of Present Illness: [name] was admitted [facility] about 1 week ago on 11/16 with left sided weakness, heaviness of her left extremities, and vision changes. She was found to have a recent embolic stroke with MRI showing acute infarcts in multiple vascular territories and MRA head showing no significant stenoses of the intracranial vasculature. Permission hypertension was allowed, and she was ultimately continued on her home Xarelto  (which she had been adherent with prior to the 11/16 admission). She was discharged home 3 days ago on 11/19. TTE during the hospitalization showed normal LV and RV size and function with normal strain.   Today, she presents with confusion and word finding difficult. She is unable to give me a history but does say yes when I ask her if she is having difficulty expressing her words. She does state clearly that she has no chest pain, abdominal pain, shortness of breath, or swelling in her legs. She tells me she does have back pain but points to her left lower lumbar region when I ask her where her back pain is. She attributes to not being able to get comfortable on her bed. The patient states she has been adherent with her Xarelto  since being discharge 3 days ago.  CT head without contrast shows a focal petechial hemorrhage in the left frontal parietal cortical infarct bed along with scattered hypodensities compatible with  known acute cortical infarcts on recent MRI. 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 She laos has splenic and bilateral renal wedged hypoenhancing areas suspicious for infarcts.  MRI brain now shows greatly increased number of acute/early subacute ischemia from prior but no acute hemorrhage.  Vitals notable for HR in the 80s-90s, BP 150s-170s/80s. Satting 98% while breathing room air. Troponin elevated 1797-> 2690.   Past Medical History:  Diagnosis Date   Anxiety    Arthritis    Atrial fibrillation (HCC)    Benign colon polyp    Cancer (HCC) 2012   breast- left   Complication of anesthesia    Diverticulosis    Dysrhythmia    History of breast cancer    s/p lumpectomy and HRT   Hyperlipidemia    intolerant of all cholesterol meds   Obesity    Palpitations    PONV (postoperative nausea and vomiting)    Rheumatoid factor positive 02/09/2020    Past Surgical History:  Procedure Laterality Date   ABDOMINAL HYSTERECTOMY  partial   per pt she still has uterus   BREAST BIOPSY Right 2011   benign   BREAST BIOPSY  10/22/2022   MM RT RADIOACTIVE SEED LOC MAMMO GUIDE 10/22/2022 [facility] MAMMOGRAP[facility]   BREAST LUMPECTOMY  07/26/2007   Left lumpectomy+sln,ER+PR-,Her2-,T1bN0   BREAST LUMPECTOMY WITH RADIOACTIVE SEED LOCALIZATION Right 10/23/2022   Procedure: RIGHT BREAST LUMPECTOMY WITH RADIOACTIVE SEED LOCALIZATION;  Surgeon: Curvin Deward MOULD, MD;  Location: [facility];  Service: General;  Laterality: Right;   CHOLECYSTECTOMY     CP : ETT 10/11     Medical History. Walked 630 on [place] protocl. normal ECG   CT RADIATION THERAPY GUIDE     KNEE ARTHROSCOPY     left 2004; right 2014   Left Knee Score     OOPHORECTOMY  2008   scalp surgery  2012   cylindromas- trichoepethliomas   TOTAL ABDOMINAL HYSTERECTOMY W/ BILATERAL SALPINGOOPHORECTOMY        Home Medications:  Prior to Admission medications   Medication Sig Start Date End Date Taking? Authorizing Provider  acetaminophen  (TYLENOL ) 500 MG tablet Take 500 mg by mouth every 6 (six) hours as needed for moderate pain.    [provider]  Ascorbic Acid  (VITAMIN C ) 1000 MG tablet Take 1,000 mg by mouth daily.    Ozell Heron HERO, MD  b complex vitamins capsule Take 1 capsule by mouth every other day.    Ozell Heron HERO, MD  diltiazem  (CARDIZEM ) 30 MG tablet Take 1 tablet every 4 hours AS NEEDED for heart rate >100 as long as top blood pressure >100. Patient not taking: Reported on 10/11/2024 04/14/24   Ozell Heron HERO, MD  magnesium  oxide (MAG-OX) 400 (240 Mg) MG tablet Take 400 mg by mouth daily.    Ozell Heron HERO, MD  Moringa Oleifera (MORINGA PO) Take 1 g by mouth daily.    [provider]  Multiple Vitamin (MULTIVITAMIN) tablet Take 1 tablet by mouth daily.    Ozell Heron HERO, MD  OVER THE COUNTER MEDICATION Cholesterol support-nightly    [provider]  OVER THE COUNTER MEDICATION Energize Electrolytes-1 scoop daily    [provider]  OVER THE COUNTER MEDICATION Omegas with Turmeric-daily    [provider]  OVER THE COUNTER MEDICATION Collagen    [provider]  OVER THE COUNTER MEDICATION Take 2,700 mg by mouth daily.    [provider]  rivaroxaban  (XARELTO ) 20 MG TABS tablet Take 1 tablet (20 mg total) by mouth daily with supper. Take with food. Start after completion of starter pack. Patient taking differently: Take 1 tablet (20 mg total) by mouth daily with supper. Take with food. Start after completion of starter pack. 09/06/24   Barbarann Dixon B, RPH-CPP  simvastatin  (ZOCOR ) 40 MG tablet Take 1 tablet (40 mg total) by mouth at bedtime. 10/11/24   Ozell Heron HERO, MD  triamcinolone  cream (KENALOG ) 0.1 % Apply 1 Application topically 2 (two) times daily. 03/14/24   Ozell Heron HERO, MD  Vitamin  D-Vitamin K (VITAMIN K2-VITAMIN D3 PO) Take 1 tablet by mouth daily.    [provider]    Scheduled Meds:  fentaNYL  (SUBLIMAZE ) injection  50 mcg Intravenous Once   simvastatin   40 mg Oral QHS   sodium chloride  flush  3 mL Intravenous Q12H   Continuous Infusions:  PRN Meds: acetaminophen  **OR** acetaminophen   Allergies:    Allergies  Allergen Reactions   Naproxen Sodium Hives   Neomycin-Bacitracin Zn-Polymyx Swelling    In ear, where it was applied.    Tetracycline Rash    All over the body   Apixaban Hives   Crestor [Rosuvastatin] Other (See Comments)    Joint pain   Lipitor  [Atorvastatin ] Other (See Comments)    Joint pain and weakness.    Advil [Ibuprofen] Rash   Other Rash    ALL TAPES-RASH (PT HAS LEAST RXN TO PAPER TAPE)  Social History:   Social History   Socioeconomic History   Marital status: Widowed    Spouse name: Not on file   Number of children: 3   Years of education: Not on file   Highest education level: Not on file  Occupational History   Occupation: Retired Engineer, Civil (consulting)  Tobacco Use   Smoking status: Never   Smokeless tobacco: Never   Tobacco comments:    Never smoke 04/09/22  Vaping Use   Vaping status: Never Used  Substance and Sexual Activity   Alcohol use: Not Currently    Alcohol/week: 1.0 standard drink of alcohol    Types: 1 Glasses of wine per week    Comment: occasional   Drug use: No   Sexual activity: Not Currently  Other Topics Concern   Not on file  Social History Narrative   Not on file   Social Drivers of Health   Financial Resource Strain: Low Risk  (04/14/2024)   Overall Financial Resource Strain (CARDIA)    Difficulty of Paying Living Expenses: Not hard at all  Food Insecurity: Unknown (10/11/2024)   Hunger Vital Sign    Worried About Running Out of Food in the Last Year: Not on file    Ran Out of Food in the Last Year: Never true  Transportation Needs: No Transportation Needs (10/11/2024)   PRAPARE -  Administrator, Civil Service (Medical): No    Lack of Transportation (Non-Medical): No  Physical Activity: Insufficiently Active (04/14/2024)   Exercise Vital Sign    Days of Exercise per Week: 3 days    Minutes of Exercise per Session: 30 min  Stress: No Stress Concern Present (04/14/2024)   [employer] of Occupational Health - Occupational Stress Questionnaire    Feeling of Stress : Only a little  Social Connections: Moderately Integrated (10/08/2024)   Social Connection and Isolation Panel    Frequency of Communication with Friends and Family: More than three times a week    Frequency of Social Gatherings with Friends and Family: Twice a week    Attends Religious Services: More than 4 times per year    Active Member of [employer] or Organizations: Yes    Attends Banker Meetings: More than 4 times per year    Marital Status: Widowed  Intimate Partner Violence: Not At Risk (10/11/2024)   Humiliation, Afraid, Rape, and Kick questionnaire    Fear of Current or Ex-Partner: No    Emotionally Abused: No    Physically Abused: No    Sexually Abused: No    Family History:   Family History  Problem Relation Age of Onset   Cancer Mother        liver   Cancer Father        bone   Cancer Maternal Uncle        colon   Coronary artery disease Neg Hx      ROS:  Please see the history of present illness.   Physical Exam/Data: Vitals:   10/14/24 1445 10/14/24 1500 10/14/24 1515 10/14/24 1736  BP: (!) 159/78 (!) 160/80 (!) 165/80 (!) 172/91  Pulse: 78 79 82 98  Resp: 15 18 13 16   Temp:    98.6 F (37 C)  TempSrc:    Oral  SpO2: 100% 99% 100% 100%  Weight:      Height:        Intake/Output Summary (Last 24 hours) at 10/14/2024 1745 Last data filed at 10/14/2024 1558 Gross per  24 hour  Intake 1000 ml  Output --  Net 1000 ml      10/14/2024    1:43 PM 10/09/2024    4:14 AM 10/09/2024   12:33 AM  Last 3 Weights  Weight (lbs) 163 lb 162 lb 11.2 oz  162 lb 11.2 oz  Weight (kg) 73.936 kg 73.8 kg 73.8 kg     Body mass index is 27.98 kg/m.  General:  Well nourished, well developed, in mild distress HEENT: normal Neck: no JVD Vascular: No carotid bruits; Distal pulses 2+ bilaterally Cardiac:  normal S1, S2; RRR; no murmur Lungs:  clear to auscultation bilaterally, no wheezing, rhonchi or rales  Abd: soft, nontender, no hepatomegaly  Ext: no edema Musculoskeletal:  No deformities, BUE and BLE strength normal and equal Skin: warm and dry  Neuro:  confused with word finding difficulty Psych:  confused  EKG:  The EKG was personally reviewed and demonstrates:  SR, narrow QRS, q wave aVL, non specific STT wave changes, no concerning STE Telemetry:  Telemetry was personally reviewed and demonstrates:  SR  Relevant CV Studies: TTE 10/09/2024 IMPRESSIONS   1. Left ventricular ejection fraction, by estimation, is 60 to 65%. Left  ventricular ejection fraction by 3D volume is 65 %. The left ventricle has  normal function. The left ventricle has no regional wall motion  abnormalities. Left ventricular diastolic   parameters were normal. The average left ventricular global longitudinal  strain is -18.1 %. The global longitudinal strain is normal.   2. Right ventricular systolic function is normal. The right ventricular  size is normal. There is normal pulmonary artery systolic pressure.   3. The mitral valve is normal in structure. Mild mitral valve  regurgitation. No evidence of mitral stenosis.   4. The aortic valve is tricuspid. Aortic valve regurgitation is not  visualized. No aortic stenosis is present.   5. The inferior vena cava is dilated in size with >50% respiratory  variability, suggesting right atrial pressure of 8 mmHg.   Laboratory Data: High Sensitivity Troponin:   Recent Labs  Lab 10/14/24 1353 10/14/24 1602  TROPONINIHS 1,797* 2,690*     Chemistry Recent Labs  Lab 10/08/24 0911 10/08/24 1858 10/14/24 1353  10/14/24 1407  NA 141 140 139 140  K 3.8 4.0 3.7 3.7  CL 107 106 104 103  CO2 23 23 20*  --   GLUCOSE 111* 92 110* 119*  BUN 16 14 10 11   CREATININE 0.74 0.78 0.80 0.80  CALCIUM  9.1 9.2 9.3  --   GFRNONAA >60 >60 >60  --   ANIONGAP 11 11 15   --     Recent Labs  Lab 10/08/24 0911 10/08/24 1858 10/14/24 1353  PROT 6.7 6.8 6.9  ALBUMIN 3.7 3.7 3.5  AST 34 31 48*  ALT 36 36 47*  ALKPHOS 145* 142* 151*  BILITOT 1.1 1.2 1.2   Lipids  Recent Labs  Lab 10/09/24 0139  CHOL 287*  TRIG 80  HDL 88  LDLCALC 183*  CHOLHDL 3.3    Hematology Recent Labs  Lab 10/08/24 0911 10/08/24 1858 10/14/24 1353 10/14/24 1407  WBC 5.1 6.5 9.8  --   RBC 4.37 4.44 4.70  --   HGB 13.5 13.5 14.3 14.6  HCT 40.4 41.3 42.0 43.0  MCV 92.4 93.0 89.4  --   MCH 30.9 30.4 30.4  --   MCHC 33.4 32.7 34.0  --   RDW 13.9 14.0 13.3  --   PLT 210 201 125*  --  Thyroid   Recent Labs  Lab 10/08/24 1858  TSH 1.499  FREET4 0.91    Radiology/Studies:  CT Head Wo Contrast Result Date: 10/14/2024 CLINICAL DATA:  Altered level of consciousness EXAM: CT HEAD WITHOUT CONTRAST TECHNIQUE: Contiguous axial images were obtained from the base of the skull through the vertex without intravenous contrast. RADIATION DOSE REDUCTION: This exam was performed according to the departmental dose-optimization program which includes automated exposure control, adjustment of the mA and/or kV according to patient size and/or use of iterative reconstruction technique. COMPARISON:  10/08/2024 FINDINGS: Brain: There are scattered cortical hypodensities compatible with the multifocal acute infarcts seen on recent MRI. These are most pronounced within the right cerebellar hemisphere and left frontal parietal cortex. Within the left frontal parietal cortical infarct image 26/5 there is a 3 mm focus of petechial hemorrhage which has developed in the interim. No new areas of infarct are identified. The lateral ventricles and midline  structures are otherwise unremarkable. No mass effect. Vascular: Stable atherosclerosis.  No hyperdense vessel. Skull: Multiple hyperdense cutaneous lesions are again identified, which may reflect numerous sebaceous cyst or neurofibromas. The calvarium is unremarkable. Sinuses/Orbits: Mild polypoid mucosal thickening within the right maxillary sinus. Remaining paranasal sinuses are clear. Other: None. IMPRESSION: 1. Scattered hypodensities within the bilateral cerebral cortices and right cerebellar hemisphere compatible with known acute cortical infarcts identified on the 10/08/2024 MRI. 2. Focal petechial hemorrhage within the left frontal parietal cortical infarct bed as above, measuring 3 mm. No mass effect. Critical Value/emergent results were called by telephone at the time of interpretation on 10/14/2024 at 3:52 pm to provider [facility] , who verbally acknowledged these results. Electronically Signed   By: Ozell Daring M.D.   On: 10/14/2024 15:57   CT ABDOMEN PELVIS W CONTRAST Result Date: 10/14/2024 CLINICAL DATA:  Altered mental status. Concern for pulmonary disease. Abdominal pain. EXAM: CT ANGIOGRAPHY CHEST CT ABDOMEN AND PELVIS WITH CONTRAST TECHNIQUE: Multidetector CT imaging of the chest was performed using the standard protocol during bolus administration of intravenous contrast. Multiplanar CT image reconstructions and MIPs were obtained to evaluate the vascular anatomy. Multidetector CT imaging of the abdomen and pelvis was performed using the standard protocol during bolus administration of intravenous contrast. RADIATION DOSE REDUCTION: This exam was performed according to the departmental dose-optimization program which includes automated exposure control, adjustment of the mA and/or kV according to patient size and/or use of iterative reconstruction technique. CONTRAST:  75mL OMNIPAQUE  IOHEXOL  350 MG/ML SOLN COMPARISON:  Chest radiograph dated 10/14/2024. FINDINGS: CTA CHEST FINDINGS  Cardiovascular: There is no cardiomegaly or pericardial effusion. Mild atherosclerotic calcification of the thoracic aorta. No aneurysmal dilatation or dissection. The origins of the great vessels of the aortic arch appear patent. No pulmonary artery embolus identified. Mediastinum/Nodes: No hilar or mediastinal adenopathy. The esophagus is grossly unremarkable. No mediastinal fluid collection. Lungs/Pleura: Faint diffuse hazy density throughout the lungs with mosaic attenuation may represent areas of air trapping and related to underlying small airway versus small vessel disease. No focal consolidation, pleural effusion, pneumothorax. The central airways are patent. Musculoskeletal: Degenerative changes of spine. No acute osseous pathology. Review of the MIP images confirms the above findings. CT ABDOMEN and PELVIS FINDINGS No intra-abdominal free air or free fluid. Hepatobiliary: Multiple hepatic hypoenhancing lesions measure up to 18 mm in the dome of the liver consistent with metastatic disease. Liver abscesses are less likely. Mild biliary dilatation, post cholecystectomy. Pancreas: There is a 1.5 x 2 cm hypoenhancing mass in the body of the pancreas  consistent with primary pancreatic neoplasm. There is atrophy of the distal body of the pancreas. Spleen: Wedge shaped area of hypoenhancement involving the upper pole of the spleen most consistent with infarct. Adrenals/Urinary Tract: The adrenal glands unremarkable. Bilateral renal wedge-shaped hypoenhancing areas suspicious for infarct. An area of hypoenhancement involving the inferior pole of the right kidney may represent infarct or pyelonephritis. Correlation with urinalysis recommended. There is no hydronephrosis on either side. There is symmetric excretion of contrast by both kidneys. The visualized ureters and urinary bladder appear unremarkable. Stomach/Bowel: There is sigmoid diverticulosis. There is no bowel obstruction or active inflammation. The  appendix is normal. Vascular/Lymphatic: Moderate aortoiliac atherosclerotic disease. The IVC is unremarkable. No portal venous gas. There is no adenopathy. Reproductive: The uterus is retroverted or retroflexed. Anterior uterine fibroid noted. No suspicious adnexal masses. Other: None Musculoskeletal: Osteopenia with degenerative changes spine. No acute osseous pathology. Review of the MIP images confirms the above findings. IMPRESSION: 1. No acute intrathoracic pathology. No pulmonary artery embolus identified. 2. A 1.5 x 2 cm hypoenhancing mass in the body of the pancreas consistent with primary pancreatic neoplasm. 3. Multiple hepatic hypoenhancing lesions consistent with metastatic disease. 4. Splenic and bilateral renal wedge-shaped hypoenhancing areas suspicious for infarcts. An area of hypoenhancement involving the inferior pole of the right kidney may represent infarct or pyelonephritis. Correlation with urinalysis recommended. 5. Sigmoid diverticulosis. No bowel obstruction. Normal appendix. 6.  Aortic Atherosclerosis (ICD10-I70.0). Electronically Signed   By: Vanetta Chou M.D.   On: 10/14/2024 15:51   CT Angio Chest PE W and/or Wo Contrast Result Date: 10/14/2024 CLINICAL DATA:  Altered mental status. Concern for pulmonary disease. Abdominal pain. EXAM: CT ANGIOGRAPHY CHEST CT ABDOMEN AND PELVIS WITH CONTRAST TECHNIQUE: Multidetector CT imaging of the chest was performed using the standard protocol during bolus administration of intravenous contrast. Multiplanar CT image reconstructions and MIPs were obtained to evaluate the vascular anatomy. Multidetector CT imaging of the abdomen and pelvis was performed using the standard protocol during bolus administration of intravenous contrast. RADIATION DOSE REDUCTION: This exam was performed according to the departmental dose-optimization program which includes automated exposure control, adjustment of the mA and/or kV according to patient size and/or use  of iterative reconstruction technique. CONTRAST:  75mL OMNIPAQUE  IOHEXOL  350 MG/ML SOLN COMPARISON:  Chest radiograph dated 10/14/2024. FINDINGS: CTA CHEST FINDINGS Cardiovascular: There is no cardiomegaly or pericardial effusion. Mild atherosclerotic calcification of the thoracic aorta. No aneurysmal dilatation or dissection. The origins of the great vessels of the aortic arch appear patent. No pulmonary artery embolus identified. Mediastinum/Nodes: No hilar or mediastinal adenopathy. The esophagus is grossly unremarkable. No mediastinal fluid collection. Lungs/Pleura: Faint diffuse hazy density throughout the lungs with mosaic attenuation may represent areas of air trapping and related to underlying small airway versus small vessel disease. No focal consolidation, pleural effusion, pneumothorax. The central airways are patent. Musculoskeletal: Degenerative changes of spine. No acute osseous pathology. Review of the MIP images confirms the above findings. CT ABDOMEN and PELVIS FINDINGS No intra-abdominal free air or free fluid. Hepatobiliary: Multiple hepatic hypoenhancing lesions measure up to 18 mm in the dome of the liver consistent with metastatic disease. Liver abscesses are less likely. Mild biliary dilatation, post cholecystectomy. Pancreas: There is a 1.5 x 2 cm hypoenhancing mass in the body of the pancreas consistent with primary pancreatic neoplasm. There is atrophy of the distal body of the pancreas. Spleen: Wedge shaped area of hypoenhancement involving the upper pole of the spleen most consistent with infarct. Adrenals/Urinary Tract:  The adrenal glands unremarkable. Bilateral renal wedge-shaped hypoenhancing areas suspicious for infarct. An area of hypoenhancement involving the inferior pole of the right kidney may represent infarct or pyelonephritis. Correlation with urinalysis recommended. There is no hydronephrosis on either side. There is symmetric excretion of contrast by both kidneys. The  visualized ureters and urinary bladder appear unremarkable. Stomach/Bowel: There is sigmoid diverticulosis. There is no bowel obstruction or active inflammation. The appendix is normal. Vascular/Lymphatic: Moderate aortoiliac atherosclerotic disease. The IVC is unremarkable. No portal venous gas. There is no adenopathy. Reproductive: The uterus is retroverted or retroflexed. Anterior uterine fibroid noted. No suspicious adnexal masses. Other: None Musculoskeletal: Osteopenia with degenerative changes spine. No acute osseous pathology. Review of the MIP images confirms the above findings. IMPRESSION: 1. No acute intrathoracic pathology. No pulmonary artery embolus identified. 2. A 1.5 x 2 cm hypoenhancing mass in the body of the pancreas consistent with primary pancreatic neoplasm. 3. Multiple hepatic hypoenhancing lesions consistent with metastatic disease. 4. Splenic and bilateral renal wedge-shaped hypoenhancing areas suspicious for infarcts. An area of hypoenhancement involving the inferior pole of the right kidney may represent infarct or pyelonephritis. Correlation with urinalysis recommended. 5. Sigmoid diverticulosis. No bowel obstruction. Normal appendix. 6.  Aortic Atherosclerosis (ICD10-I70.0). Electronically Signed   By: Vanetta Chou M.D.   On: 10/14/2024 15:51   DG Chest Port 1 View Result Date: 10/14/2024 CLINICAL DATA:  Questionable sepsis - evaluate for abnormality EXAM: PORTABLE CHEST 1 VIEW COMPARISON:  May 06, 2022 FINDINGS: The cardiomediastinal silhouette is unchanged in contour.Atherosclerotic calcifications. No pleural effusion. No pneumothorax. No acute pleuroparenchymal abnormality. IMPRESSION: No acute cardiopulmonary abnormality. Electronically Signed   By: Corean Salter M.D.   On: 10/14/2024 14:10   Assessment and Plan: Elevated troponin, likely NSTEMI type 2 Acute and subacute embolic CVA Intracerebral hemorrhage  Acute encephalopathy  New pancreatic mass with concern  for metastatic disease Splenic and renal infracts Atrial fibrillation Cardiology has been consulted due to elevated and increasing troponin of 1800-> 2700. The patient is largely asymptomatic from a cardiovascular perspective and has stable vitals. She is not in acute heart failure. Given that she has no chest pain or shortness of breath, I favor that this is a type 2 event, however, with her acute encephalopathy, it is impossible to say definitely that she is not having a type 1 event. She could have had an embolic phenomenon to one of her coronaries, but this would be unlikely with no chest pain or shortness of breath. Her ECG has a q wave in avL (present on 11/16 ECG) but no concerning ST elevations. However, she presented with worsening confusion and has new infarcts on brain MRI which are new since her brain MRI just over a week ago. She also has a few scattered chronic microhemorrhages. It is unclear why she has new lesions, and this may be related to her new pancreatic mass with concern for metastatic disease. She does have afib but has been adherent with her DOAC. 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 I would defer any cardiac ischemic evaluation until her goals are clarified and if she would be able to tolerate DAPT from a neurologic perspective - No heparin  for now given recent CVA and concern for intracerebral hemorrhage on CT; would discuss with neurology if and when we could trial anti-coagulation; however, an acute coronary syndrome is not her most pressing problem at this time as she has no chest pain and is not in heart  failure - Can consider DVT dopplers; TTE showed no atrial shunt detected by color flow doppler but does not seem like bubble study was done - Aspirin  per neurology, I do not feel strongly that she needs anti-platelet for ACS at this time given the above - Would trend troponin until peaks  - Repeat TTE in AM  with bubble - Agree with Palliative care consutl  Risk Assessment/Risk Scores:     CHA2DS2-VASc Score =   6 The patient's score is based upon: age, sex, htn, CVA     For questions or updates, please contact [facility] Please consult [email or web address] for contact info under    Signed, Jerrell DELENA Orchard, MD  10/14/2024 5:45 PM

## 2024-10-14 NOTE — ED Notes (Signed)
 Not in room, pt in MRI, report received.

## 2024-10-14 NOTE — ED Notes (Signed)
 Pt anxious, pt requesting someone who isn't there, pt only oriented to self, re oriented pt. Pt c/o back pain. Pain med given.

## 2024-10-14 NOTE — ED Notes (Signed)
 Pt c/o moderate to severe lower back pain.  This RN messaged attending MD and requested a PO pain medication for pt.

## 2024-10-14 NOTE — Consult Note (Signed)
 NEUROLOGY CONSULT NOTE   Date of service: October 14, 2024 Patient Name: [name] [name] MRN:  [id number] DOB:  October 03, 1946 Chief Complaint: embolic strokes despite Xarelto  Requesting Provider: Seena Marsa NOVAK, MD  History of Present Illness  [name] [name] is a 78 y.o. female with hx of afibb on xarelto , hyperlipidemia, prior strokes, breast cancer who presents with worsening confusion.  She initially presented on 11/16 with left upper and lower extremity numbness and heaviness and found to have bilateral embolic appearing infarcts despite being compliant with Xarelto  in the setting of her A-fib.  She was discharged in the hospital on 11/19.  She seemed fine for a couple days, then more drowsy, lethargic and not herself an struggling with speech. She is requiring help with all the ADLs. Daughter called EMS due to continued decline and brought her to the ED.  She is complaining of back pain and cannot get comfortable in the bed despite trying to reposition herself.  LKW: unclear Modified rankin score: 4-Needs assistance to walk and tend to bodily needs IV Thrombolysis: not offered EVT: not offered, low suspicion for LVO  NIHSS components Score: Comment  1a Level of Conscious 0[x]  1[]  2[]  3[]      1b LOC Questions 0[]  1[]  2[x]       1c LOC Commands 0[x]  1[]  2[]       2 Best Gaze 0[x]  1[]  2[]       3 Visual 0[x]  1[]  2[]  3[]      4 Facial Palsy 0[x]  1[]  2[]  3[]      5a Motor Arm - left 0[x]  1[]  2[]  3[]  4[]  UN[]    5b Motor Arm - Right 0[x]  1[]  2[]  3[]  4[]  UN[]    6a Motor Leg - Left 0[x]  1[]  2[]  3[]  4[]  UN[]    6b Motor Leg - Right 0[x]  1[]  2[]  3[]  4[]  UN[]    7 Limb Ataxia 0[x]  1[]  2[]  UN[]      8 Sensory 0[x]  1[]  2[]  UN[]      9 Best Language 0[x]  1[]  2[]  3[]      10 Dysarthria 0[x]  1[]  2[]  UN[]      11 Extinct. and Inattention 0[x]  1[]  2[]       TOTAL: 2      ROS  Unable to ascertain due to encephalopathy.  Past History   Past Medical History:  Diagnosis Date   Anxiety    Arthritis     Atrial fibrillation (HCC)    Benign colon polyp    Cancer (HCC) 2012   breast- left   Complication of anesthesia    Diverticulosis    Dysrhythmia    History of breast cancer    s/p lumpectomy and HRT   Hyperlipidemia    intolerant of all cholesterol meds   Obesity    Palpitations    PONV (postoperative nausea and vomiting)    Rheumatoid factor positive 02/09/2020    Past Surgical History:  Procedure Laterality Date   ABDOMINAL HYSTERECTOMY  partial   per pt she still has uterus   BREAST BIOPSY Right 2011   benign   BREAST BIOPSY  10/22/2022   MM RT RADIOACTIVE SEED LOC MAMMO GUIDE 10/22/2022 [facility]   BREAST LUMPECTOMY  07/26/2007   Left lumpectomy+sln,ER+PR-,Her2-,T1bN0   BREAST LUMPECTOMY WITH RADIOACTIVE SEED LOCALIZATION Right 10/23/2022   Procedure: RIGHT BREAST LUMPECTOMY WITH RADIOACTIVE SEED LOCALIZATION;  Surgeon: Curvin Deward MOULD, MD;  [facility];  Service: General;  Laterality: Right;   CHOLECYSTECTOMY     CP : ETT 10/11  Medical History. Walked 630 on Bruce protocl. normal ECG   CT RADIATION THERAPY GUIDE     KNEE ARTHROSCOPY     left 2004; right 2014   Left Knee Score     OOPHORECTOMY  2008   scalp surgery  2012   cylindromas- trichoepethliomas   TOTAL ABDOMINAL HYSTERECTOMY W/ BILATERAL SALPINGOOPHORECTOMY      Family History: Family History  Problem Relation Age of Onset   Cancer Mother        liver   Cancer Father        bone   Cancer Maternal Uncle        colon   Coronary artery disease Neg Hx     Social History  reports that she has never smoked. She has never used smokeless tobacco. She reports that she does not currently use alcohol after a past usage of about 1.0 standard drink of alcohol per week. She reports that she does not use drugs.  Allergies  Allergen Reactions   Naproxen Sodium Hives   Neomycin-Bacitracin Zn-Polymyx Swelling    In ear, where it was applied.    Tetracycline Rash    All  over the body   Apixaban Hives   Crestor [Rosuvastatin] Other (See Comments)    Joint pain   Lipitor  [Atorvastatin ] Other (See Comments)    Joint pain and weakness.    Advil [Ibuprofen] Rash   Other Rash    ALL TAPES-RASH (PT HAS LEAST RXN TO PAPER TAPE)    Medications   Current Facility-Administered Medications:    acetaminophen  (TYLENOL ) tablet 650 mg, 650 mg, Oral, Q6H PRN **OR** acetaminophen  (TYLENOL ) suppository 650 mg, 650 mg, Rectal, Q6H PRN, Melvin, Alexander B, MD   gadobutrol  (GADAVIST ) 1 MMOL/ML injection 7 mL, 7 mL, Intravenous, Once PRN, Melvenia Motto, MD   HYDROcodone -acetaminophen  (NORCO/VICODIN) 5-325 MG per tablet 1 tablet, 1 tablet, Oral, Q6H PRN, Melvin, Alexander B, MD, 1 tablet at 10/14/24 2045   simvastatin  (ZOCOR ) tablet 40 mg, 40 mg, Oral, QHS, Melvin, Alexander B, MD, 40 mg at 10/14/24 2107   sodium chloride  flush (NS) 0.9 % injection 3 mL, 3 mL, Intravenous, Q12H, Melvin, Alexander B, MD, 3 mL at 10/14/24 2107  Current Outpatient Medications:    acetaminophen  (TYLENOL ) 500 MG tablet, Take 500 mg by mouth every 6 (six) hours as needed for moderate pain., Disp: , Rfl:    Ascorbic Acid  (VITAMIN C ) 1000 MG tablet, Take 1,000 mg by mouth daily., Disp: , Rfl:    b complex vitamins capsule, Take 1 capsule by mouth every other day., Disp: , Rfl:    diltiazem  (CARDIZEM ) 30 MG tablet, Take 1 tablet every 4 hours AS NEEDED for heart rate >100 as long as top blood pressure >100. (Patient not taking: Reported on 10/11/2024), Disp: 30 tablet, Rfl: 3   magnesium  oxide (MAG-OX) 400 (240 Mg) MG tablet, Take 400 mg by mouth daily., Disp: , Rfl:    Moringa Oleifera (MORINGA PO), Take 1 g by mouth daily., Disp: , Rfl:    Multiple Vitamin (MULTIVITAMIN) tablet, Take 1 tablet by mouth daily., Disp: , Rfl:    OVER THE COUNTER MEDICATION, Cholesterol support-nightly, Disp: , Rfl:    OVER THE COUNTER MEDICATION, Energize Electrolytes-1 scoop daily, Disp: , Rfl:    OVER THE COUNTER  MEDICATION, Omegas with Turmeric-daily, Disp: , Rfl:    OVER THE COUNTER MEDICATION, Collagen, Disp: , Rfl:    OVER THE COUNTER MEDICATION, Take 2,700 mg by mouth daily., Disp: , Rfl:  rivaroxaban  (XARELTO ) 20 MG TABS tablet, Take 1 tablet (20 mg total) by mouth daily with supper. Take with food. Start after completion of starter pack. (Patient taking differently: Take 1 tablet (20 mg total) by mouth daily with supper. Take with food. Start after completion of starter pack.), Disp: 30 tablet, Rfl: 1   simvastatin  (ZOCOR ) 40 MG tablet, Take 1 tablet (40 mg total) by mouth at bedtime., Disp: 90 tablet, Rfl: 1   triamcinolone  cream (KENALOG ) 0.1 %, Apply 1 Application topically 2 (two) times daily., Disp: 30 g, Rfl: 0   Vitamin D -Vitamin K (VITAMIN K2-VITAMIN D3 PO), Take 1 tablet by mouth daily., Disp: , Rfl:   Vitals   Vitals:   10/14/24 1915 10/14/24 1931 10/14/24 2000 10/14/24 2030  BP: (!) 159/84 (!) 153/74 (!) 163/84 (!) 155/112  Pulse: 90 82 94 97  Resp: 19 18 19 16   Temp:      TempSrc:      SpO2: 96% 97% 98% 99%  Weight:      Height:        Body mass index is 27.98 kg/m.   Physical Exam   General: Laying comfortably in bed; in no acute distress.  HENT: Normal oropharynx and mucosa. Normal external appearance of ears and nose.  Neck: Supple, no pain or tenderness  CV: No JVD. No peripheral edema.  Pulmonary: Symmetric Chest rise. Normal respiratory effort.  Abdomen: Soft to touch, non-tender.  Ext: No cyanosis, edema, or deformity  Skin: No rash. Normal palpation of skin.   Musculoskeletal: Normal digits and nails by inspection. No clubbing.   Neurologic Examination  Mental status/Cognition: Alert, oriented to self, place, month but not year, poor attention.  Speech/language: Fluent, comprehension intact, object naming intact, repetition intact.  Cranial nerves:   CN II Pupils equal and reactive to light, no VF deficits    CN III,IV,VI EOM intact, no gaze preference or  deviation, no nystagmus    CN V normal sensation in V1, V2, and V3 segments bilaterally    CN VII no asymmetry, no nasolabial fold flattening    CN VIII normal hearing to speech    CN IX & X normal palatal elevation, no uvular deviation    CN XI 5/5 head turn and 5/5 shoulder shrug bilaterally    CN XII midline tongue protrusion    Motor:  Muscle bulk: normal, tone normal, pronator drift none tremor none Mvmt Root Nerve  Muscle Right Left Comments  SA C5/6 Ax Deltoid 5 5   EF C5/6 Mc Biceps 5 5   EE C6/7/8 Rad Triceps 5 5   WF C6/7 Med FCR     WE C7/8 PIN ECU     F Ab C8/T1 U ADM/FDI 5 5   HF L1/2/3 Fem Illopsoas     KE L2/3/4 Fem Quad     DF L4/5 D Peron Tib Ant     PF S1/2 Tibial Grc/Sol      Sensation:  Light touch Intact throughout   Pin prick    Temperature    Vibration   Proprioception    Coordination/Complex Motor:  - Finger to Nose intact bilaterally - Heel to shin unable to assess as she wanted to be left alone. - Rapid alternating movement intact bilaterally - Gait: Deferred. Labs/Imaging/Neurodiagnostic studies   CBC:  Recent Labs  Lab 2024-11-06 0911 11/06/24 1858 10/14/24 1353 10/14/24 1407  WBC 5.1 6.5 9.8  --   NEUTROABS 3.0  --  7.4  --  HGB 13.5 13.5 14.3 14.6  HCT 40.4 41.3 42.0 43.0  MCV 92.4 93.0 89.4  --   PLT 210 201 125*  --    Basic Metabolic Panel:  Lab Results  Component Value Date   NA 140 10/14/2024   K 3.7 10/14/2024   CO2 20 (L) 10/14/2024   GLUCOSE 119 (H) 10/14/2024   BUN 11 10/14/2024   CREATININE 0.80 10/14/2024   CALCIUM  9.3 10/14/2024   GFRNONAA >60 10/14/2024   GFRAA  07/20/2007    >60        The eGFR has been calculated using the MDRD equation. This calculation has not been validated in all clinical   Lipid Panel:  Lab Results  Component Value Date   LDLCALC 183 (H) 10/09/2024   HgbA1c:  Lab Results  Component Value Date   HGBA1C 5.4 10/08/2024   Urine Drug Screen:     Component Value Date/Time    LABOPIA NONE DETECTED 10/08/2024 1459   COCAINSCRNUR NONE DETECTED 10/08/2024 1459   LABBENZ NONE DETECTED 10/08/2024 1459   AMPHETMU NONE DETECTED 10/08/2024 1459   THCU NONE DETECTED 10/08/2024 1459   LABBARB NONE DETECTED 10/08/2024 1459    Alcohol Level     Component Value Date/Time   [facility] <15 10/08/2024 0911   INR  Lab Results  Component Value Date   INR 2.1 (H) 10/14/2024   APTT  Lab Results  Component Value Date   APTT 27 10/08/2024   AED levels: No results found for: PHENYTOIN, ZONISAMIDE, LAMOTRIGINE, LEVETIRACETA  CT Head without contrast(Personally reviewed): 1. Scattered hypodensities within the bilateral cerebral cortices and right cerebellar hemisphere compatible with known acute cortical infarcts identified on the 10/08/2024 MRI. 2. Focal petechial hemorrhage within the left frontal parietal cortical infarct bed as above, measuring 3 mm. No mass effect.  CT angio Head and Neck with contrast(Personally reviewed): Pending  MRI Brain(Personally reviewed): 1. Innumerable foci of acute/early subacute ischemia, with a greatly increased number of lesions compared to the prior study. 2. No acute hemorrhage. 3. 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  ASSESSMENT   [name] [name] is a 77 y.o. female with hx of afibb on xarelto , hyperlipidemia, prior strokes, breast cancer who presents with worsening confusion nad back pain. She was found to have multiple embolic strokes despite compliance with eliquis.  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  Stroke likely secondary to hypercoagulable state due to pancreatic malignancy. Noted embolic distribution of strokes despite Xarelto  is concerning for a possible adenocarcinoma.  RECOMMENDATIONS  - Frequent Neuro checks per stroke unit protocol - Recommend Vascular imaging with MR angio head without contrast  and US  carotid doppler. Patient declined contrast imaging. - Recommend obtaining TTE - Recommend obtaining Lipid panel with LDL - Please start statin if LDL > 70 - Recommend HbA1c to evaluate for diabetes and how well it is controlled. - Hold AC overnight given petechial hemorrhage. Will start Heparin  gtt in AM. - SBP goal - permissive hypertension first 24 h < 220/110. Held home meds.  - Recommend Telemetry monitoring for arrythmia - Recommend bedside swallow screen prior to PO intake. - Stroke education booklet - Recommend PT/OT/SLP consult  ______________________________________________________________________  Plan discussed with patient's daughter and son in law at the bedside.  Signed, Lyndsee Casa, MD [facility]

## 2024-10-14 NOTE — ED Provider Notes (Signed)
 [place] EMERGENCY DEPARTMENT AT [facility] Provider Note   CSN: [id number] Arrival date & time: 10/14/24  1333     Patient presents with: Altered Mental Status   [name] [name] is a 78 y.o. female.   HPI Patient presents for altered mental status.  Medical history includes atrial fibrillation, HLD, CVA.  She is prescribed Xarelto .  She was seen in the ED 1 week ago and admitted for CVA.  Symptoms at the time were left-sided weakness.  MRI showed embolic appearing infarcts.  She was discharged home 5 days ago.  For the past 2 days, patient's family has noticed decreased activity, decreased p.o. intake, generalized weakness, confusion.    Prior to Admission medications   Medication Sig Start Date End Date Taking? Authorizing Provider  acetaminophen  (TYLENOL ) 500 MG tablet Take 500 mg by mouth every 6 (six) hours as needed for moderate pain.    [provider]  Ascorbic Acid  (VITAMIN C ) 1000 MG tablet Take 1,000 mg by mouth daily.    Ozell Heron [name], MD  b complex vitamins capsule Take 1 capsule by mouth every other day.    Ozell Heron [name], MD  diltiazem  (CARDIZEM ) 30 MG tablet Take 1 tablet every 4 hours AS NEEDED for heart rate >100 as long as top blood pressure >100. Patient not taking: Reported on 10/11/2024 04/14/24   Ozell Heron [name], MD  magnesium  oxide (MAG-OX) 400 (240 Mg) MG tablet Take 400 mg by mouth daily.    Ozell Heron [name], MD  Moringa Oleifera (MORINGA PO) Take 1 g by mouth daily.    [provider]  Multiple Vitamin (MULTIVITAMIN) tablet Take 1 tablet by mouth daily.    Ozell Heron [name], MD  OVER THE COUNTER MEDICATION Cholesterol support-nightly    [provider]  OVER THE COUNTER MEDICATION Energize Electrolytes-1 scoop daily    [provider]  OVER THE COUNTER MEDICATION Omegas with Turmeric-daily    [provider]  OVER THE COUNTER MEDICATION Collagen    [provider]  OVER THE COUNTER  MEDICATION Take 2,700 mg by mouth daily.    [provider]  rivaroxaban  (XARELTO ) 20 MG TABS tablet Take 1 tablet (20 mg total) by mouth daily with supper. Take with food. Start after completion of starter pack. Patient taking differently: Take 1 tablet (20 mg total) by mouth daily with supper. Take with food. Start after completion of starter pack. 09/06/24   Barbarann Marquail Bradwell B, RPH-CPP  simvastatin  (ZOCOR ) 40 MG tablet Take 1 tablet (40 mg total) by mouth at bedtime. 10/11/24   Ozell Heron [name], MD  triamcinolone  cream (KENALOG ) 0.1 % Apply 1 Application topically 2 (two) times daily. 03/14/24   Ozell Heron [name], MD  Vitamin D -Vitamin K (VITAMIN K2-VITAMIN D3 PO) Take 1 tablet by mouth daily.    [provider]    Allergies: Naproxen sodium, Neomycin-bacitracin zn-polymyx, Tetracycline, Apixaban, Crestor [rosuvastatin], Lipitor  [atorvastatin ], Advil [ibuprofen], and Other    Review of Systems  Unable to perform ROS: Mental status change    Updated Vital Signs BP (!) 165/80   Pulse 82   Temp 99.9 F (37.7 C) (Rectal)   Resp 13   Ht 5' 4 (1.626 m)   Wt 73.9 kg   SpO2 100%   BMI 27.98 kg/m   Physical Exam Vitals and nursing note reviewed.  Constitutional:      General: She is not in acute distress.    Appearance: Normal appearance. She is well-developed.  She is not ill-appearing, toxic-appearing or diaphoretic.  HENT:     Head: Normocephalic and atraumatic.     Right Ear: External ear normal.     Left Ear: External ear normal.     Nose: Nose normal.     Mouth/Throat:     Mouth: Mucous membranes are moist.  Eyes:     Extraocular Movements: Extraocular movements intact.     Conjunctiva/sclera: Conjunctivae normal.  Cardiovascular:     Rate and Rhythm: Regular rhythm. Tachycardia present.     Heart sounds: No murmur heard. Pulmonary:     Effort: Pulmonary effort is normal. No respiratory distress.     Breath sounds: Normal breath sounds. No wheezing or  rales.  Chest:     Chest wall: No tenderness.  Abdominal:     General: There is no distension.     Palpations: Abdomen is soft.     Tenderness: There is abdominal tenderness. There is no guarding or rebound.  Musculoskeletal:        General: No swelling or deformity. Normal range of motion.     Cervical back: Normal range of motion and neck supple.  Skin:    General: Skin is warm and dry.     Findings: Bruising present.  Neurological:     General: No focal deficit present.     Mental Status: She is alert. She is disoriented.     (all labs ordered are listed, but only abnormal results are displayed) Labs Reviewed  COMPREHENSIVE METABOLIC PANEL WITH GFR - Abnormal; Notable for the following components:      Result Value   CO2 20 (*)    Glucose, Bld 110 (*)    AST 48 (*)    ALT 47 (*)    Alkaline Phosphatase 151 (*)    All other components within normal limits  CBC WITH DIFFERENTIAL/PLATELET - Abnormal; Notable for the following components:   Platelets 125 (*)    All other components within normal limits  PROTIME-INR - Abnormal; Notable for the following components:   Prothrombin Time 25.0 (*)    INR 2.1 (*)    All other components within normal limits  URINALYSIS, W/ REFLEX TO CULTURE (INFECTION SUSPECTED) - Abnormal; Notable for the following components:   Ketones, ur 5 (*)    Leukocytes,Ua TRACE (*)    Bacteria, UA RARE (*)    All other components within normal limits  CBG MONITORING, ED - Abnormal; Notable for the following components:   Glucose-Capillary 128 (*)    All other components within normal limits  I-STAT CHEM 8, ED - Abnormal; Notable for the following components:   Glucose, Bld 119 (*)    All other components within normal limits  TROPONIN I (HIGH SENSITIVITY) - Abnormal; Notable for the following components:   Troponin I (High Sensitivity) 1,797 (*)    All other components within normal limits  RESP PANEL BY RT-PCR (RSV, FLU A&B, COVID)  [id number]   CULTURE, BLOOD (ROUTINE X 2)  CULTURE, BLOOD (ROUTINE X 2)  AMMONIA  BRAIN NATRIURETIC PEPTIDE  LIPASE, BLOOD  I-STAT CG4 LACTIC ACID, ED  TROPONIN I (HIGH SENSITIVITY)    EKG: EKG Interpretation Date/Time:  Saturday October 14 2024 13:45:14 EST Ventricular Rate:  86 PR Interval:  149 QRS Duration:  88 QT Interval:  363 QTC Calculation: 435 R Axis:   51  Text Interpretation: Sinus rhythm Borderline T abnormalities, inferior leads Confirmed by Melvenia Motto [phone]) on 10/14/2024 1:48:29 PM  Radiology: CT Head  Wo Contrast Result Date: 10/14/2024 CLINICAL DATA:  Altered level of consciousness EXAM: CT HEAD WITHOUT CONTRAST TECHNIQUE: Contiguous axial images were obtained from the base of the skull through the vertex without intravenous contrast. RADIATION DOSE REDUCTION: This exam was performed according to the departmental dose-optimization program which includes automated exposure control, adjustment of the mA and/or kV according to patient size and/or use of iterative reconstruction technique. COMPARISON:  10/08/2024 FINDINGS: Brain: There are scattered cortical hypodensities compatible with the multifocal acute infarcts seen on recent MRI. These are most pronounced within the right cerebellar hemisphere and left frontal parietal cortex. Within the left frontal parietal cortical infarct image 26/5 there is a 3 mm focus of petechial hemorrhage which has developed in the interim. No new areas of infarct are identified. The lateral ventricles and midline structures are otherwise unremarkable. No mass effect. Vascular: Stable atherosclerosis.  No hyperdense vessel. Skull: Multiple hyperdense cutaneous lesions are again identified, which may reflect numerous sebaceous cyst or neurofibromas. The calvarium is unremarkable. Sinuses/Orbits: Mild polypoid mucosal thickening within the right maxillary sinus. Remaining paranasal sinuses are clear. Other: None. IMPRESSION: 1. Scattered hypodensities within  the bilateral cerebral cortices and right cerebellar hemisphere compatible with known acute cortical infarcts identified on the 10/08/2024 MRI. 2. Focal petechial hemorrhage within the left frontal parietal cortical infarct bed as above, measuring 3 mm. No mass effect. Critical Value/emergent results were called by telephone at the time of interpretation on 10/14/2024 at 3:52 pm to provider [facility] , who verbally acknowledged these results. Electronically Signed   By: Ozell Daring M.D.   On: 10/14/2024 15:57   CT ABDOMEN PELVIS W CONTRAST Result Date: 10/14/2024 CLINICAL DATA:  Altered mental status. Concern for pulmonary disease. Abdominal pain. EXAM: CT ANGIOGRAPHY CHEST CT ABDOMEN AND PELVIS WITH CONTRAST TECHNIQUE: Multidetector CT imaging of the chest was performed using the standard protocol during bolus administration of intravenous contrast. Multiplanar CT image reconstructions and MIPs were obtained to evaluate the vascular anatomy. Multidetector CT imaging of the abdomen and pelvis was performed using the standard protocol during bolus administration of intravenous contrast. RADIATION DOSE REDUCTION: This exam was performed according to the departmental dose-optimization program which includes automated exposure control, adjustment of the mA and/or kV according to patient size and/or use of iterative reconstruction technique. CONTRAST:  75mL OMNIPAQUE  IOHEXOL  350 MG/ML SOLN COMPARISON:  Chest radiograph dated 10/14/2024. FINDINGS: CTA CHEST FINDINGS Cardiovascular: There is no cardiomegaly or pericardial effusion. Mild atherosclerotic calcification of the thoracic aorta. No aneurysmal dilatation or dissection. The origins of the great vessels of the aortic arch appear patent. No pulmonary artery embolus identified. Mediastinum/Nodes: No hilar or mediastinal adenopathy. The esophagus is grossly unremarkable. No mediastinal fluid collection. Lungs/Pleura: Faint diffuse hazy density throughout the  lungs with mosaic attenuation may represent areas of air trapping and related to underlying small airway versus small vessel disease. No focal consolidation, pleural effusion, pneumothorax. The central airways are patent. Musculoskeletal: Degenerative changes of spine. No acute osseous pathology. Review of the MIP images confirms the above findings. CT ABDOMEN and PELVIS FINDINGS No intra-abdominal free air or free fluid. Hepatobiliary: Multiple hepatic hypoenhancing lesions measure up to 18 mm in the dome of the liver consistent with metastatic disease. Liver abscesses are less likely. Mild biliary dilatation, post cholecystectomy. Pancreas: There is a 1.5 x 2 cm hypoenhancing mass in the body of the pancreas consistent with primary pancreatic neoplasm. There is atrophy of the distal body of the pancreas. Spleen: Wedge shaped area of hypoenhancement  involving the upper pole of the spleen most consistent with infarct. Adrenals/Urinary Tract: The adrenal glands unremarkable. Bilateral renal wedge-shaped hypoenhancing areas suspicious for infarct. An area of hypoenhancement involving the inferior pole of the right kidney may represent infarct or pyelonephritis. Correlation with urinalysis recommended. There is no hydronephrosis on either side. There is symmetric excretion of contrast by both kidneys. The visualized ureters and urinary bladder appear unremarkable. Stomach/Bowel: There is sigmoid diverticulosis. There is no bowel obstruction or active inflammation. The appendix is normal. Vascular/Lymphatic: Moderate aortoiliac atherosclerotic disease. The IVC is unremarkable. No portal venous gas. There is no adenopathy. Reproductive: The uterus is retroverted or retroflexed. Anterior uterine fibroid noted. No suspicious adnexal masses. Other: None Musculoskeletal: Osteopenia with degenerative changes spine. No acute osseous pathology. Review of the MIP images confirms the above findings. IMPRESSION: 1. No acute  intrathoracic pathology. No pulmonary artery embolus identified. 2. A 1.5 x 2 cm hypoenhancing mass in the body of the pancreas consistent with primary pancreatic neoplasm. 3. Multiple hepatic hypoenhancing lesions consistent with metastatic disease. 4. Splenic and bilateral renal wedge-shaped hypoenhancing areas suspicious for infarcts. An area of hypoenhancement involving the inferior pole of the right kidney may represent infarct or pyelonephritis. Correlation with urinalysis recommended. 5. Sigmoid diverticulosis. No bowel obstruction. Normal appendix. 6.  Aortic Atherosclerosis (ICD10-I70.0). Electronically Signed   By: Vanetta Chou M.D.   On: 10/14/2024 15:51   CT Angio Chest PE W and/or Wo Contrast Result Date: 10/14/2024 CLINICAL DATA:  Altered mental status. Concern for pulmonary disease. Abdominal pain. EXAM: CT ANGIOGRAPHY CHEST CT ABDOMEN AND PELVIS WITH CONTRAST TECHNIQUE: Multidetector CT imaging of the chest was performed using the standard protocol during bolus administration of intravenous contrast. Multiplanar CT image reconstructions and MIPs were obtained to evaluate the vascular anatomy. Multidetector CT imaging of the abdomen and pelvis was performed using the standard protocol during bolus administration of intravenous contrast. RADIATION DOSE REDUCTION: This exam was performed according to the departmental dose-optimization program which includes automated exposure control, adjustment of the mA and/or kV according to patient size and/or use of iterative reconstruction technique. CONTRAST:  75mL OMNIPAQUE  IOHEXOL  350 MG/ML SOLN COMPARISON:  Chest radiograph dated 10/14/2024. FINDINGS: CTA CHEST FINDINGS Cardiovascular: There is no cardiomegaly or pericardial effusion. Mild atherosclerotic calcification of the thoracic aorta. No aneurysmal dilatation or dissection. The origins of the great vessels of the aortic arch appear patent. No pulmonary artery embolus identified.  Mediastinum/Nodes: No hilar or mediastinal adenopathy. The esophagus is grossly unremarkable. No mediastinal fluid collection. Lungs/Pleura: Faint diffuse hazy density throughout the lungs with mosaic attenuation may represent areas of air trapping and related to underlying small airway versus small vessel disease. No focal consolidation, pleural effusion, pneumothorax. The central airways are patent. Musculoskeletal: Degenerative changes of spine. No acute osseous pathology. Review of the MIP images confirms the above findings. CT ABDOMEN and PELVIS FINDINGS No intra-abdominal free air or free fluid. Hepatobiliary: Multiple hepatic hypoenhancing lesions measure up to 18 mm in the dome of the liver consistent with metastatic disease. Liver abscesses are less likely. Mild biliary dilatation, post cholecystectomy. Pancreas: There is a 1.5 x 2 cm hypoenhancing mass in the body of the pancreas consistent with primary pancreatic neoplasm. There is atrophy of the distal body of the pancreas. Spleen: Wedge shaped area of hypoenhancement involving the upper pole of the spleen most consistent with infarct. Adrenals/Urinary Tract: The adrenal glands unremarkable. Bilateral renal wedge-shaped hypoenhancing areas suspicious for infarct. An area of hypoenhancement involving the inferior pole of  the right kidney may represent infarct or pyelonephritis. Correlation with urinalysis recommended. There is no hydronephrosis on either side. There is symmetric excretion of contrast by both kidneys. The visualized ureters and urinary bladder appear unremarkable. Stomach/Bowel: There is sigmoid diverticulosis. There is no bowel obstruction or active inflammation. The appendix is normal. Vascular/Lymphatic: Moderate aortoiliac atherosclerotic disease. The IVC is unremarkable. No portal venous gas. There is no adenopathy. Reproductive: The uterus is retroverted or retroflexed. Anterior uterine fibroid noted. No suspicious adnexal masses.  Other: None Musculoskeletal: Osteopenia with degenerative changes spine. No acute osseous pathology. Review of the MIP images confirms the above findings. IMPRESSION: 1. No acute intrathoracic pathology. No pulmonary artery embolus identified. 2. A 1.5 x 2 cm hypoenhancing mass in the body of the pancreas consistent with primary pancreatic neoplasm. 3. Multiple hepatic hypoenhancing lesions consistent with metastatic disease. 4. Splenic and bilateral renal wedge-shaped hypoenhancing areas suspicious for infarcts. An area of hypoenhancement involving the inferior pole of the right kidney may represent infarct or pyelonephritis. Correlation with urinalysis recommended. 5. Sigmoid diverticulosis. No bowel obstruction. Normal appendix. 6.  Aortic Atherosclerosis (ICD10-I70.0). Electronically Signed   By: Vanetta Chou M.D.   On: 10/14/2024 15:51   DG Chest Port 1 View Result Date: 10/14/2024 CLINICAL DATA:  Questionable sepsis - evaluate for abnormality EXAM: PORTABLE CHEST 1 VIEW COMPARISON:  May 06, 2022 FINDINGS: The cardiomediastinal silhouette is unchanged in contour.Atherosclerotic calcifications. No pleural effusion. No pneumothorax. No acute pleuroparenchymal abnormality. IMPRESSION: No acute cardiopulmonary abnormality. Electronically Signed   By: Corean Salter M.D.   On: 10/14/2024 14:10     Procedures   Medications Ordered in the ED  fentaNYL  (SUBLIMAZE ) injection 50 mcg (50 mcg Intravenous Not Given 10/14/24 1603)  lactated ringers  bolus 1,000 mL (0 mLs Intravenous Stopped 10/14/24 1558)  iohexol  (OMNIPAQUE ) 350 MG/ML injection 75 mL (75 mLs Intravenous Contrast Given 10/14/24 1538)                                    Medical Decision Making Amount and/or Complexity of Data Reviewed Labs: ordered. Radiology: ordered.  Risk Prescription drug management.   This patient presents to the ED for concern of generalized weakness, this involves an extensive number of treatment  options, and is a complaint that carries with it a high risk of complications and morbidity.  The differential diagnosis includes deconditioning, polypharmacy, CVA, seizure, infection, neoplasm   Co morbidities / Chronic conditions that complicate the patient evaluation  atrial fibrillation, HLD, CVA   Additional history obtained:  Additional history obtained from EMR External records from outside source obtained and reviewed including EMS   Lab Tests:  I Ordered, and personally interpreted labs.  The pertinent results include: Normal hemoglobin, no leukocytosis, elevated troponin concerning for NSTEMI   Imaging Studies ordered:  I ordered imaging studies including chest x-ray, CT head, CTA chest, CT of abdomen and pelvis I independently visualized and interpreted imaging which showed new focal petechial hemorrhage in left frontal lobe; pancreatic mass concerning for primary neoplasm; hepatic lesions concerning for metastatic disease; hypoenhancing areas of bilateral kidneys suspicious for infarcts I agree with the radiologist interpretation   Cardiac Monitoring: / EKG:  The patient was maintained on a cardiac monitor.  I personally viewed and interpreted the cardiac monitored which showed an underlying rhythm of: Sinus rhythm   Problem List / ED Course / Critical interventions / Medication management  Patient presenting for  worsening generalized weakness, fatigue, decreased p.o. intake, confusion over the past 2 days.  History is initially provided by EMS.  Patient is currently disoriented and unable to provide much history.  On exam, she does endorse some generalized abdominal tenderness.  Rectal temperature is 99.9 degrees.  Current breathing is unlabored.  Initial vital signs otherwise notable for tachycardia.  Workup was initiated.  After IV fluids, patient is more talkative.  She endorses some back pain.  Dose of fentanyl  was ordered.  Her lab work is notable for elevated  troponin.  EKG does not show any evidence of STEMI.  Patient was asked again about any possible recent chest pain which she denies.  She underwent noncontrasted CT scan of head, CTA of chest as well as CT of abdomen and pelvis.  Notable findings include a new petechial hemorrhage on CT of head, when compared to imaging from last week; as well as findings concerning for metastatic pancreatic cancer.  I spoke with neurologist on-call, Dr. Matthews, who recommends repeat MRI brain with and without contrast.  She also recommends palliative care consult.  MRI was pending at time of admission.  Although CT imaging showed concern of possible pyelonephritis, urinalysis not consistent with UTI.  Patient was admitted for further management. I ordered medication including IV fluids for hydration, fentanyl  for analgesia Reevaluation of the patient after these medicines showed that the patient improved I have reviewed the patients home medicines and have made adjustments as needed   Consultations Obtained:  I requested consultation with the neurologist, Dr. Matthews,  and discussed lab and imaging findings as well as pertinent plan - they recommend: MRI brain with and without contrast, decision for anticoagulation pending results.   Social Determinants of Health:  Lives at home with daughter     Final diagnoses:  Altered mental status, unspecified altered mental status type  Generalized weakness  Pancreatic mass    ED Discharge Orders     None          Melvenia Motto, MD 10/14/24 1635

## 2024-10-15 ENCOUNTER — Inpatient Hospital Stay (HOSPITAL_COMMUNITY)

## 2024-10-15 ENCOUNTER — Encounter (HOSPITAL_COMMUNITY): Payer: Self-pay | Admitting: Internal Medicine

## 2024-10-15 DIAGNOSIS — C787 Secondary malignant neoplasm of liver and intrahepatic bile duct: Secondary | ICD-10-CM | POA: Diagnosis not present

## 2024-10-15 DIAGNOSIS — I480 Paroxysmal atrial fibrillation: Secondary | ICD-10-CM | POA: Diagnosis not present

## 2024-10-15 DIAGNOSIS — R531 Weakness: Secondary | ICD-10-CM | POA: Diagnosis not present

## 2024-10-15 DIAGNOSIS — Z515 Encounter for palliative care: Secondary | ICD-10-CM | POA: Diagnosis not present

## 2024-10-15 DIAGNOSIS — K8689 Other specified diseases of pancreas: Secondary | ICD-10-CM | POA: Diagnosis not present

## 2024-10-15 DIAGNOSIS — I4891 Unspecified atrial fibrillation: Secondary | ICD-10-CM

## 2024-10-15 DIAGNOSIS — C259 Malignant neoplasm of pancreas, unspecified: Secondary | ICD-10-CM | POA: Diagnosis not present

## 2024-10-15 DIAGNOSIS — R29702 NIHSS score 2: Secondary | ICD-10-CM | POA: Diagnosis not present

## 2024-10-15 DIAGNOSIS — I6340 Cerebral infarction due to embolism of unspecified cerebral artery: Secondary | ICD-10-CM | POA: Diagnosis not present

## 2024-10-15 DIAGNOSIS — R233 Spontaneous ecchymoses: Secondary | ICD-10-CM

## 2024-10-15 DIAGNOSIS — Z7189 Other specified counseling: Secondary | ICD-10-CM

## 2024-10-15 DIAGNOSIS — R4182 Altered mental status, unspecified: Secondary | ICD-10-CM

## 2024-10-15 DIAGNOSIS — I10 Essential (primary) hypertension: Secondary | ICD-10-CM

## 2024-10-15 DIAGNOSIS — I639 Cerebral infarction, unspecified: Secondary | ICD-10-CM

## 2024-10-15 DIAGNOSIS — R7989 Other specified abnormal findings of blood chemistry: Secondary | ICD-10-CM

## 2024-10-15 DIAGNOSIS — D6869 Other thrombophilia: Secondary | ICD-10-CM

## 2024-10-15 LAB — COMPREHENSIVE METABOLIC PANEL WITH GFR
ALT: 38 U/L (ref 0–44)
AST: 42 U/L — ABNORMAL HIGH (ref 15–41)
Albumin: 3.3 g/dL — ABNORMAL LOW (ref 3.5–5.0)
Alkaline Phosphatase: 131 U/L — ABNORMAL HIGH (ref 38–126)
Anion gap: 8 (ref 5–15)
BUN: 9 mg/dL (ref 8–23)
CO2: 27 mmol/L (ref 22–32)
Calcium: 9 mg/dL (ref 8.9–10.3)
Chloride: 103 mmol/L (ref 98–111)
Creatinine, Ser: 0.86 mg/dL (ref 0.44–1.00)
GFR, Estimated: >60 mL/min (ref >60)
Glucose, Bld: 108 mg/dL — ABNORMAL HIGH (ref 70–99)
Potassium: 3.6 mmol/L (ref 3.5–5.1)
Sodium: 138 mmol/L (ref 135–145)
Total Bilirubin: 1.4 mg/dL — ABNORMAL HIGH (ref 0.0–1.2)
Total Protein: 6.4 g/dL — ABNORMAL LOW (ref 6.5–8.1)

## 2024-10-15 LAB — CBC
HCT: 37.8 % (ref 36.0–46.0)
Hemoglobin: 12.8 g/dL (ref 12.0–15.0)
MCH: 30.5 pg (ref 26.0–34.0)
MCHC: 33.9 g/dL (ref 30.0–36.0)
MCV: 90.2 fL (ref 80.0–100.0)
Platelets: 110 K/uL — ABNORMAL LOW (ref 150–400)
RBC: 4.19 MIL/uL (ref 3.87–5.11)
RDW: 13.3 % (ref 11.5–15.5)
WBC: 10.3 K/uL (ref 4.0–10.5)
nRBC: 0 % (ref 0.0–0.2)

## 2024-10-15 LAB — APTT: aPTT: 30 s (ref 24–36)

## 2024-10-15 LAB — BRAIN NATRIURETIC PEPTIDE: B Natriuretic Peptide: 378.8 pg/mL — ABNORMAL HIGH (ref 0.0–100.0)

## 2024-10-15 LAB — PREALBUMIN: Prealbumin: 14 mg/dL — ABNORMAL LOW (ref 18–38)

## 2024-10-15 LAB — HEPARIN LEVEL (UNFRACTIONATED): Heparin Unfractionated: 0.67 [IU]/mL (ref 0.30–0.70)

## 2024-10-15 LAB — TROPONIN I (HIGH SENSITIVITY): Troponin I (High Sensitivity): 2187 ng/L (ref <18)

## 2024-10-15 MED ORDER — HEPARIN (PORCINE) 25000 UT/250ML-% IV SOLN
1100.0000 [IU]/h | INTRAVENOUS | Status: DC
  Administered 2024-10-15: 600 [IU]/h via INTRAVENOUS
  Administered 2024-10-16: 1000 [IU]/h via INTRAVENOUS
  Administered 2024-10-17: 1100 [IU]/h via INTRAVENOUS
  Filled 2024-10-15 (×3): qty 250

## 2024-10-15 MED ORDER — ALPRAZOLAM 0.25 MG PO TABS
0.5000 mg | ORAL_TABLET | Freq: Three times a day (TID) | ORAL | Status: DC | PRN
  Administered 2024-10-15 – 2024-10-19 (×3): 0.5 mg via ORAL
  Filled 2024-10-15 (×5): qty 2

## 2024-10-15 MED ORDER — ONDANSETRON HCL 4 MG/2ML IJ SOLN
4.0000 mg | Freq: Four times a day (QID) | INTRAMUSCULAR | Status: DC | PRN
  Administered 2024-10-15 – 2024-10-17 (×3): 4 mg via INTRAVENOUS
  Filled 2024-10-15 (×2): qty 2

## 2024-10-15 MED ORDER — HYDROCODONE-ACETAMINOPHEN 5-325 MG PO TABS
1.0000 | ORAL_TABLET | Freq: Four times a day (QID) | ORAL | Status: DC | PRN
  Administered 2024-10-15 – 2024-10-17 (×5): 1 via ORAL
  Filled 2024-10-15 (×7): qty 1

## 2024-10-15 MED ORDER — LIDOCAINE 5 % EX PTCH
1.0000 | MEDICATED_PATCH | Freq: Once | CUTANEOUS | Status: AC
  Administered 2024-10-15: 1 via TRANSDERMAL
  Filled 2024-10-15: qty 1

## 2024-10-15 MED ORDER — LIDOCAINE 5 % EX PTCH
1.0000 | MEDICATED_PATCH | CUTANEOUS | Status: DC
  Administered 2024-10-15 – 2024-10-20 (×6): 1 via TRANSDERMAL
  Filled 2024-10-15 (×6): qty 1

## 2024-10-15 MED ORDER — LORAZEPAM 1 MG PO TABS
1.0000 mg | ORAL_TABLET | Freq: Once | ORAL | Status: AC
  Administered 2024-10-15: 1 mg via ORAL
  Filled 2024-10-15: qty 1

## 2024-10-15 MED ORDER — MORPHINE SULFATE (PF) 2 MG/ML IV SOLN
1.0000 mg | INTRAVENOUS | Status: DC | PRN
  Administered 2024-10-15 (×2): 1 mg via INTRAVENOUS
  Filled 2024-10-15 (×4): qty 1

## 2024-10-15 NOTE — Progress Notes (Signed)
 PHARMACY - ANTICOAGULATION CONSULT NOTE  Pharmacy Consult for IV heparin  Indication: atrial fibrillation  Allergies  Allergen Reactions   Naproxen Sodium Hives   Neomycin-Bacitracin Zn-Polymyx Swelling    In ear, where it was applied.    Tetracycline Rash    All over the body   Apixaban Hives   Crestor [Rosuvastatin] Other (See Comments)    Joint pain   Lipitor  [Atorvastatin ] Other (See Comments)    Joint pain and weakness.    Advil [Ibuprofen] Rash   Other Rash    ALL TAPES-RASH (PT HAS LEAST RXN TO PAPER TAPE)    Patient Measurements: Height: 5' 4 (162.6 cm) Weight: 73.9 kg (163 lb) IBW/kg (Calculated) : 54.7 HEPARIN  DW (KG): 70  Vital Signs: Temp: 98.4 F (36.9 C) (11/23 1133) Temp Source: Oral (11/23 1133) BP: 152/79 (11/23 1133) Pulse Rate: 79 (11/23 1133)  Labs: Recent Labs    10/14/24 1353 10/14/24 1407 10/14/24 1602 10/14/24 2110 10/15/24 0105  HGB 14.3 14.6  --   --  12.8  HCT 42.0 43.0  --   --  37.8  PLT 125*  --   --   --  110*  LABPROT 25.0*  --   --   --   --   INR 2.1*  --   --   --   --   CREATININE 0.80 0.80  --   --  0.86  TROPONINIHS 1,797*  --  2,690* 2,529* 2,187*    Estimated Creatinine Clearance: 52.3 mL/min (by C-G formula based on SCr of 0.86 mg/dL).   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  Also admitted with ICH,  after discussions with cards, neuro and oncology MD, pharmacy asked to start IV heparin  with low dose/stroke dosing.  Goal of Therapy:  Heparin  level 0.3-0.5 Monitor platelets by anticoagulation protocol: Yes   Plan:  Start IV heparin  without bolus at 600 units/hr. Check aPTT and heparin  level in 8 hrs. Daily CBC, heparin  level and aPTT. Planning for lovenox at dc, will check copays Monday AM.  Harlene Barlow, Berdine BIRCH, BCPS, BCCP Clinical Pharmacist  10/15/2024 1:28 PM   [facility] pharmacy phone numbers are listed on [email or web address]

## 2024-10-15 NOTE — Consult Note (Signed)
 Palliative Medicine Inpatient Consult Note  Consulting Provider:  Seena Marsa NOVAK, MD   Reason for consult:   Palliative Care Consult Services Palliative Medicine Consult  Reason for Consult? Recent stroke, new small intracerebral hemorrhage.  New pancreatic mass and liver lesions.  New elevated troponin.  Goals of care.   Recent embolic stroke. Now presenting with confusion and found to have small intracerebral hemorrhage. 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 Troponin also significantly elevated with limited options if ongoing intracerebral hemorrhage. MRI brain ordered to further evaluate bleeding.  Not on options for infarcts if ongoing bleeding.  Also with new pancreatic mass suspicious for neoplasm  long-term prognosis is poor.  Family has agreed to DNR/DNI.  I notified them that I would be consulting palliative for ongoing discussions about goals of care while continuing workup given some of her treatment options will likely be limited.    10/15/2024  HPI:  Per intake H&P --> [name] [name] is a 78 y.o. female with medical history significant of hyperlipidemia, CVA, breast cancer presenting with worsening mentation.   The palliative care team has been asked to support additional goals of care conversations.  Clinical Assessment/Goals of Care:  *Please note that this is a verbal dictation therefore any spelling or grammatical errors are due to the Dragon Medical One system interpretation.  I have reviewed medical records including EPIC notes, labs and imaging, received report from bedside RN, assessed the patient who is lying in bed with some lower back pain per self expression.  Aware of self and place though gets disoriented quickly during conversation.   I met with patient's daughter, [name] and son-in-law, Ronalee to further discuss diagnosis prognosis, GOC, EOL wishes, disposition and options.   I introduced  Palliative Medicine as specialized medical care for people living with serious illness. It focuses on providing relief from the symptoms and stress of a serious illness. The goal is to improve quality of life for both the patient and the family.  Medical History Review and Understanding:  [name] [name] has a past medical history significant for prior stroke, breast cancer, hyperlipidemia, atrial fibrillation, arthritis, & anxiety.  Social History:  [name] [name] is from [place], [place]  originally.  She has been to majority of her life in [place], [place] .  And transition to [place]  number of years ago.  Her husband passed away in 2018/10/31 at which time she moved to [place] to live less than a mile from her daughter.  She has 3 children a son Hosey who lives in [place] , a son Lorrene who lives in [place] , a daughter [name] who lives locally, 6 grandchildren, and 2 great-grandchildren.  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  [name] [name] enjoys game nights, shopping, walking, reading, volunteering, and spending time with her youngest grandchild, Erla.  [name] [name] was raised presbyterian though does not practice any specific denomination however considers herself a Christian.  Functional and Nutritional State:  Prior to hospitalization [name] [name] was living in a apartment able to perform all IADLs and B ADLs independently.  Palliative Symptoms:  Generalized lower back pain for which Tylenol  and Norco 10 to help with.  Delirium in the setting of acute hospitalization and stroke.  Advance Directives:  A detailed discussion was had today regarding advanced directives.  [name] [name] does have advanced directives though they are not on file at this time.  Code Status:  Concepts specific to code status, artifical feeding and hydration, continued IV antibiotics and rehospitalization  was had.  The difference between a aggressive medical intervention path  and a palliative comfort care path for this patient at this  time was had.   [name] [name] is an established DO NOT RESUSCITATE DO NOT INTUBATE CODE STATUS.  Discussion:  A discussion of the reasons surrounding hospitalization for [name] [name] was completed inclusive of her being more confused, weak, and having decreased oral intake since hospitalization earlier this week.  Patient's family feel like she was discharged prematurely.  Patient's family share understanding that she has a newly identified pancreatic mass as well as multiple liver lesions.  They share understanding that this is likely malignant and would be something that would lead to fairly terminal prognosis.  Family understand that she has a splenule infarct.  Family understand that she has a intracerebral hemorrhage.  Family understand that her CVAs are most probably secondary to malignant processes.  We discussed options moving forward inclusive of additional diagnostic imaging and discussion with the oncology team regarding patient's likely metastatic disease to better illuminate prognosis .  We discussed if time is limited consideration of hospice. I described hospice as a service for patients who have a life expectancy of 6 months or less. The goal of hospice is the preservation of dignity and quality at the end phases of life. Under hospice care, the focus changes from curative to symptom relief.   Patients daughter and son-in-law share understanding.  They are both hoping to speak to the primary medical team to gain insights on what steps to take next.  Offered time in therapeutic listening as a form of emotional support given the rapid onset of patient's recent symptoms.  Discussed the importance of continued conversation with family and their  medical providers regarding overall plan of care and treatment options, ensuring decisions are within the context of the patients values and GOCs.  Decision Maker: [name] [name],[name] [name] (Daughter): Estimated you get what [phone] (Mobile)   SUMMARY OF  RECOMMENDATIONS   DNAR/DNI  Discussed with patient's family openly and honestly options moving forward inclusive of further diagnostic workup/oncology insights and/or hospice enrollment  Patient's family hopeful to speak to the primary medical team to best determine next steps  Ongoing palliative care support  Code Status/Advance Care Planning: DNAR/DNI  Symptom Management:  Back pain: - Continue Tylenol  as needed, Norco as needed, morphine  as needed  Delirium: - Maintenance of strict delirium precautions  Palliative Prophylaxis:  Aspiration, Bowel Regimen, Delirium Protocol, Frequent Pain Assessment, Oral Care, Palliative Wound Care, and Turn Reposition  Additional Recommendations (Limitations, Scope, Preferences): Continue current measures  Psycho-social/Spiritual:  Desire for further Chaplaincy support: Not at this time Additional Recommendations: Education on disease burden   Prognosis: Patient's family had been told that her current imaging studies indicate a terminal prognosis  Discharge Planning: Discharge plan to be determined  Vitals:   10/15/24 0640 10/15/24 0645  BP:  (!) 141/84  Pulse:  67  Resp:  14  Temp: 98.6 F (37 C)   SpO2:  97%    Intake/Output Summary (Last 24 hours) at 10/15/2024 0658 Last data filed at 10/14/2024 1558 Gross per 24 hour  Intake 1000 ml  Output --  Net 1000 ml   Last Weight  Most recent update: 10/14/2024  1:43 PM    Weight  73.9 kg (163 lb)             LABS: CBC:    Component Value Date/Time   WBC 10.3 10/15/2024 0105   HGB 12.8 10/15/2024 0105  HGB 14.4 01/26/2012 1001   HCT 37.8 10/15/2024 0105   HCT 42.5 01/26/2012 1001   PLT 110 (L) 10/15/2024 0105   PLT 229 01/26/2012 1001   MCV 90.2 10/15/2024 0105   MCV 89.9 01/26/2012 1001   NEUTROABS 7.4 10/14/2024 1353   NEUTROABS 2.2 01/26/2012 1001   LYMPHSABS 1.4 10/14/2024 1353   LYMPHSABS 1.6 01/26/2012 1001   MONOABS 0.8 10/14/2024 1353   MONOABS 0.4  01/26/2012 1001   EOSABS 0.1 10/14/2024 1353   EOSABS 0.1 01/26/2012 1001   BASOSABS 0.1 10/14/2024 1353   BASOSABS 0.0 01/26/2012 1001   Comprehensive Metabolic Panel:    Component Value Date/Time   NA 138 10/15/2024 0105   K 3.6 10/15/2024 0105   CL 103 10/15/2024 0105   CO2 27 10/15/2024 0105   BUN 9 10/15/2024 0105   CREATININE 0.86 10/15/2024 0105   GLUCOSE 108 (H) 10/15/2024 0105   CALCIUM  9.0 10/15/2024 0105   AST 42 (H) 10/15/2024 0105   ALT 38 10/15/2024 0105   ALKPHOS 131 (H) 10/15/2024 0105   BILITOT 1.4 (H) 10/15/2024 0105   PROT 6.4 (L) 10/15/2024 0105   ALBUMIN 3.3 (L) 10/15/2024 0105   Gen: Elderly Caucasian female HEENT: Dry mucous membranes CV: Regular rate and irregular rhythm  PULM: On room air breathing is even and nonlabored ABD: soft/nontender  EXT: Bilateral lower extremity edema +1 pitting Neuro: Alert and oriented to self  PPS: 40%   This conversation/these recommendations were discussed with patient primary care team, Dr. Drusilla ______________________________________________________ Rosaline Becton [facility] Palliative Medicine Team Team Cell Phone: [phone] Please utilize secure chat with additional questions, if there is no response within 30 minutes please call the above phone number  Total Time: 75 Billing based on MDM: High  Palliative Medicine Team providers are available by phone from 7am to 7pm daily and can be reached through the team cell phone.  Should this patient require assistance outside of these hours, please call the patient's attending physician.

## 2024-10-15 NOTE — Progress Notes (Signed)
 Rounding Note   Patient Name: [name] [name] Date of Encounter: 10/15/2024  [facility] Cardiologist: Dr Perla  Subjective No CP or dyspnea; complains of nausea  Scheduled Meds:  lidocaine   1 patch Transdermal Q24H   lidocaine   1 patch Transdermal Once   simvastatin   40 mg Oral QHS   sodium chloride  flush  3 mL Intravenous Q12H   Continuous Infusions:  PRN Meds: acetaminophen  **OR** acetaminophen , gadobutrol , HYDROcodone -acetaminophen , morphine  injection, ondansetron  (ZOFRAN ) IV   Vital Signs  Vitals:   10/15/24 0645 10/15/24 0700 10/15/24 0715 10/15/24 0731  BP: (!) 141/84 (!) 147/79 (!) 155/93 128/78  Pulse: 67 71 71 84  Resp: 14   16  Temp:    97.8 F (36.6 C)  TempSrc:    Oral  SpO2: 97% 98% 97% 92%  Weight:      Height:        Intake/Output Summary (Last 24 hours) at 10/15/2024 0844 Last data filed at 10/14/2024 1558 Gross per 24 hour  Intake 1000 ml  Output --  Net 1000 ml      10/14/2024    1:43 PM 10/09/2024    4:14 AM 10/09/2024   12:33 AM  Last 3 Weights  Weight (lbs) 163 lb 162 lb 11.2 oz 162 lb 11.2 oz  Weight (kg) 73.936 kg 73.8 kg 73.8 kg      Telemetry Sinus - Personally Reviewed   Physical Exam  GEN: No acute distress.   Neck: No JVD Cardiac: RRR Respiratory: Clear to auscultation bilaterally. GI: Soft, non-distended  MS: No edema Neuro:  Nonfocal    Labs High Sensitivity Troponin:   Recent Labs  Lab 10/14/24 1353 10/14/24 1602 10/14/24 2110 10/15/24 0105  TROPONINIHS 1,797* 2,690* 2,529* 2,187*     Chemistry Recent Labs  Lab 10/08/24 1858 10/14/24 1353 10/14/24 1407 10/15/24 0105  NA 140 139 140 138  K 4.0 3.7 3.7 3.6  CL 106 104 103 103  CO2 23 20*  --  27  GLUCOSE 92 110* 119* 108*  BUN 14 10 11 9   CREATININE 0.78 0.80 0.80 0.86  CALCIUM  9.2 9.3  --  9.0  PROT 6.8 6.9  --  6.4*  ALBUMIN 3.7 3.5  --  3.3*  AST 31 48*  --  42*  ALT 36 47*  --  38  ALKPHOS 142* 151*  --  131*  BILITOT 1.2 1.2   --  1.4*  GFRNONAA >60 >60  --  >60  ANIONGAP 11 15  --  8    Lipids  Recent Labs  Lab 10/09/24 0139  CHOL 287*  TRIG 80  HDL 88  LDLCALC 183*  CHOLHDL 3.3    Hematology Recent Labs  Lab 10/08/24 1858 10/14/24 1353 10/14/24 1407 10/15/24 0105  WBC 6.5 9.8  --  10.3  RBC 4.44 4.70  --  4.19  HGB 13.5 14.3 14.6 12.8  HCT 41.3 42.0 43.0 37.8  MCV 93.0 89.4  --  90.2  MCH 30.4 30.4  --  30.5  MCHC 32.7 34.0  --  33.9  RDW 14.0 13.3  --  13.3  PLT 201 125*  --  110*   Thyroid   Recent Labs  Lab 10/08/24 1858  TSH 1.499  FREET4 0.91     Radiology  MR BRAIN WO CONTRAST Result Date: 10/14/2024 EXAM: MRI BRAIN WITHOUT CONTRAST 10/14/2024 06:49:30 PM TECHNIQUE: Multiplanar multisequence MRI of the head/brain was performed without the administration of intravenous contrast. COMPARISON: 10/08/2024 CLINICAL HISTORY:  Neuro deficit, acute, stroke suspected. FINDINGS: BRAIN AND VENTRICLES: There are innumerable foci of abnormal diffusion restriction scattered throughout both cerebral and cerebellar hemispheres. The number of lesions has greatly increased compared to the prior study. There are a few scattered chronic microhemorrhages. No acute hemorrhage. No mass. No midline shift. No hydrocephalus. The sella is unremarkable. Normal flow voids. ORBITS: No acute abnormality. SINUSES AND MASTOIDS: No acute abnormality. BONES AND SOFT TISSUES: Normal marrow signal. Numerous cutaneous and subcutaneous lesions of the scalp. IMPRESSION: 1. Innumerable foci of acute/early subacute ischemia, with a greatly increased number of lesions compared to the prior study. 2. No acute hemorrhage. 3. 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 Electronically signed by: Franky Stanford MD 10/14/2024 07:13 PM EST RP Workstation: [id number]   CT Head Wo Contrast Result Date: 10/14/2024 CLINICAL DATA:  Altered level of consciousness EXAM: CT HEAD WITHOUT CONTRAST TECHNIQUE: Contiguous axial images were  obtained from the base of the skull through the vertex without intravenous contrast. RADIATION DOSE REDUCTION: This exam was performed according to the departmental dose-optimization program which includes automated exposure control, adjustment of the mA and/or kV according to patient size and/or use of iterative reconstruction technique. COMPARISON:  10/08/2024 FINDINGS: Brain: There are scattered cortical hypodensities compatible with the multifocal acute infarcts seen on recent MRI. These are most pronounced within the right cerebellar hemisphere and left frontal parietal cortex. Within the left frontal parietal cortical infarct image 26/5 there is a 3 mm focus of petechial hemorrhage which has developed in the interim. No new areas of infarct are identified. The lateral ventricles and midline structures are otherwise unremarkable. No mass effect. Vascular: Stable atherosclerosis.  No hyperdense vessel. Skull: Multiple hyperdense cutaneous lesions are again identified, which may reflect numerous sebaceous cyst or neurofibromas. The calvarium is unremarkable. Sinuses/Orbits: Mild polypoid mucosal thickening within the right maxillary sinus. Remaining paranasal sinuses are clear. Other: None. IMPRESSION: 1. Scattered hypodensities within the bilateral cerebral cortices and right cerebellar hemisphere compatible with known acute cortical infarcts identified on the 10/08/2024 MRI. 2. Focal petechial hemorrhage within the left frontal parietal cortical infarct bed as above, measuring 3 mm. No mass effect. Critical Value/emergent results were called by telephone at the time of interpretation on 10/14/2024 at 3:52 pm to provider [facility] , who verbally acknowledged these results. Electronically Signed   By: Ozell Daring M.D.   On: 10/14/2024 15:57   CT ABDOMEN PELVIS W CONTRAST Result Date: 10/14/2024 CLINICAL DATA:  Altered mental status. Concern for pulmonary disease. Abdominal pain. EXAM: CT ANGIOGRAPHY CHEST  CT ABDOMEN AND PELVIS WITH CONTRAST TECHNIQUE: Multidetector CT imaging of the chest was performed using the standard protocol during bolus administration of intravenous contrast. Multiplanar CT image reconstructions and MIPs were obtained to evaluate the vascular anatomy. Multidetector CT imaging of the abdomen and pelvis was performed using the standard protocol during bolus administration of intravenous contrast. RADIATION DOSE REDUCTION: This exam was performed according to the departmental dose-optimization program which includes automated exposure control, adjustment of the mA and/or kV according to patient size and/or use of iterative reconstruction technique. CONTRAST:  75mL OMNIPAQUE  IOHEXOL  350 MG/ML SOLN COMPARISON:  Chest radiograph dated 10/14/2024. FINDINGS: CTA CHEST FINDINGS Cardiovascular: There is no cardiomegaly or pericardial effusion. Mild atherosclerotic calcification of the thoracic aorta. No aneurysmal dilatation or dissection. The origins of the great vessels of the aortic arch appear patent. No pulmonary artery embolus identified. Mediastinum/Nodes: No hilar or mediastinal adenopathy. The esophagus is grossly unremarkable. No mediastinal fluid collection. Lungs/Pleura: Faint diffuse  hazy density throughout the lungs with mosaic attenuation may represent areas of air trapping and related to underlying small airway versus small vessel disease. No focal consolidation, pleural effusion, pneumothorax. The central airways are patent. Musculoskeletal: Degenerative changes of spine. No acute osseous pathology. Review of the MIP images confirms the above findings. CT ABDOMEN and PELVIS FINDINGS No intra-abdominal free air or free fluid. Hepatobiliary: Multiple hepatic hypoenhancing lesions measure up to 18 mm in the dome of the liver consistent with metastatic disease. Liver abscesses are less likely. Mild biliary dilatation, post cholecystectomy. Pancreas: There is a 1.5 x 2 cm hypoenhancing mass  in the body of the pancreas consistent with primary pancreatic neoplasm. There is atrophy of the distal body of the pancreas. Spleen: Wedge shaped area of hypoenhancement involving the upper pole of the spleen most consistent with infarct. Adrenals/Urinary Tract: The adrenal glands unremarkable. Bilateral renal wedge-shaped hypoenhancing areas suspicious for infarct. An area of hypoenhancement involving the inferior pole of the right kidney may represent infarct or pyelonephritis. Correlation with urinalysis recommended. There is no hydronephrosis on either side. There is symmetric excretion of contrast by both kidneys. The visualized ureters and urinary bladder appear unremarkable. Stomach/Bowel: There is sigmoid diverticulosis. There is no bowel obstruction or active inflammation. The appendix is normal. Vascular/Lymphatic: Moderate aortoiliac atherosclerotic disease. The IVC is unremarkable. No portal venous gas. There is no adenopathy. Reproductive: The uterus is retroverted or retroflexed. Anterior uterine fibroid noted. No suspicious adnexal masses. Other: None Musculoskeletal: Osteopenia with degenerative changes spine. No acute osseous pathology. Review of the MIP images confirms the above findings. IMPRESSION: 1. No acute intrathoracic pathology. No pulmonary artery embolus identified. 2. A 1.5 x 2 cm hypoenhancing mass in the body of the pancreas consistent with primary pancreatic neoplasm. 3. Multiple hepatic hypoenhancing lesions consistent with metastatic disease. 4. Splenic and bilateral renal wedge-shaped hypoenhancing areas suspicious for infarcts. An area of hypoenhancement involving the inferior pole of the right kidney may represent infarct or pyelonephritis. Correlation with urinalysis recommended. 5. Sigmoid diverticulosis. No bowel obstruction. Normal appendix. 6.  Aortic Atherosclerosis (ICD10-I70.0). Electronically Signed   By: Vanetta Chou M.D.   On: 10/14/2024 15:51   CT Angio Chest  PE W and/or Wo Contrast Result Date: 10/14/2024 CLINICAL DATA:  Altered mental status. Concern for pulmonary disease. Abdominal pain. EXAM: CT ANGIOGRAPHY CHEST CT ABDOMEN AND PELVIS WITH CONTRAST TECHNIQUE: Multidetector CT imaging of the chest was performed using the standard protocol during bolus administration of intravenous contrast. Multiplanar CT image reconstructions and MIPs were obtained to evaluate the vascular anatomy. Multidetector CT imaging of the abdomen and pelvis was performed using the standard protocol during bolus administration of intravenous contrast. RADIATION DOSE REDUCTION: This exam was performed according to the departmental dose-optimization program which includes automated exposure control, adjustment of the mA and/or kV according to patient size and/or use of iterative reconstruction technique. CONTRAST:  75mL OMNIPAQUE  IOHEXOL  350 MG/ML SOLN COMPARISON:  Chest radiograph dated 10/14/2024. FINDINGS: CTA CHEST FINDINGS Cardiovascular: There is no cardiomegaly or pericardial effusion. Mild atherosclerotic calcification of the thoracic aorta. No aneurysmal dilatation or dissection. The origins of the great vessels of the aortic arch appear patent. No pulmonary artery embolus identified. Mediastinum/Nodes: No hilar or mediastinal adenopathy. The esophagus is grossly unremarkable. No mediastinal fluid collection. Lungs/Pleura: Faint diffuse hazy density throughout the lungs with mosaic attenuation may represent areas of air trapping and related to underlying small airway versus small vessel disease. No focal consolidation, pleural effusion, pneumothorax. The central airways are  patent. Musculoskeletal: Degenerative changes of spine. No acute osseous pathology. Review of the MIP images confirms the above findings. CT ABDOMEN and PELVIS FINDINGS No intra-abdominal free air or free fluid. Hepatobiliary: Multiple hepatic hypoenhancing lesions measure up to 18 mm in the dome of the liver  consistent with metastatic disease. Liver abscesses are less likely. Mild biliary dilatation, post cholecystectomy. Pancreas: There is a 1.5 x 2 cm hypoenhancing mass in the body of the pancreas consistent with primary pancreatic neoplasm. There is atrophy of the distal body of the pancreas. Spleen: Wedge shaped area of hypoenhancement involving the upper pole of the spleen most consistent with infarct. Adrenals/Urinary Tract: The adrenal glands unremarkable. Bilateral renal wedge-shaped hypoenhancing areas suspicious for infarct. An area of hypoenhancement involving the inferior pole of the right kidney may represent infarct or pyelonephritis. Correlation with urinalysis recommended. There is no hydronephrosis on either side. There is symmetric excretion of contrast by both kidneys. The visualized ureters and urinary bladder appear unremarkable. Stomach/Bowel: There is sigmoid diverticulosis. There is no bowel obstruction or active inflammation. The appendix is normal. Vascular/Lymphatic: Moderate aortoiliac atherosclerotic disease. The IVC is unremarkable. No portal venous gas. There is no adenopathy. Reproductive: The uterus is retroverted or retroflexed. Anterior uterine fibroid noted. No suspicious adnexal masses. Other: None Musculoskeletal: Osteopenia with degenerative changes spine. No acute osseous pathology. Review of the MIP images confirms the above findings. IMPRESSION: 1. No acute intrathoracic pathology. No pulmonary artery embolus identified. 2. A 1.5 x 2 cm hypoenhancing mass in the body of the pancreas consistent with primary pancreatic neoplasm. 3. Multiple hepatic hypoenhancing lesions consistent with metastatic disease. 4. Splenic and bilateral renal wedge-shaped hypoenhancing areas suspicious for infarcts. An area of hypoenhancement involving the inferior pole of the right kidney may represent infarct or pyelonephritis. Correlation with urinalysis recommended. 5. Sigmoid diverticulosis. No  bowel obstruction. Normal appendix. 6.  Aortic Atherosclerosis (ICD10-I70.0). Electronically Signed   By: Vanetta Chou M.D.   On: 10/14/2024 15:51   DG Chest Port 1 View Result Date: 10/14/2024 CLINICAL DATA:  Questionable sepsis - evaluate for abnormality EXAM: PORTABLE CHEST 1 VIEW COMPARISON:  May 06, 2022 FINDINGS: The cardiomediastinal silhouette is unchanged in contour.Atherosclerotic calcifications. No pleural effusion. No pneumothorax. No acute pleuroparenchymal abnormality. IMPRESSION: No acute cardiopulmonary abnormality. Electronically Signed   By: Corean Salter M.D.   On: 10/14/2024 14:10    Patient Profile   78 y.o. 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  Lower extremity venous Dopplers October 2025 showed acute DVT right peroneal veins.  Echocardiogram November 2025 showed normal LV function, mild mitral regurgitation.  Carotid Dopplers November 2025 near normal bilaterally.  Head CT November 22 showed scattered hypodensities within the bilateral cerebral cortices and right cerebellar hemisphere consistent with known acute infarcts and focal hemorrhage in the left frontal parietal cortical infarct bed measuring 3 mm.  CTA showed no pulmonary embolus, hypoenhancing mass in the body of the pancreas consistent with primary pancreatic neoplasm, lesions in the liver consistent with metastatic disease, splenic and bilateral renal infarcts.  Assessment & Plan   1 elevated troponin-patient denies chest pain.  Most recent ECG shows mild inferolateral T wave inversion.  Likely demand ischemia.  Regardless patient is not a candidate for aggressive cardiac procedures in the setting of recent CVA, intracerebral hemorrhage and now pancreatic mass/hepatic  lesion suggestive of metastatic  pancreatic cancer.  2 recent CVA/splenic infarct/renal infarct/history of atrial fibrillation-given intracerebral hemorrhage anticoagulation has been on hold.  Given recent CVA and history of atrial fibrillation we would like for her to be on anticoagulation long-term when able and we will leave this decision to neurology when to initiate.  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  Further evaluation per primary service.  4 hyperlipidemia-continue Zocor .  For questions or updates, please contact [facility] Please consult [email or web address] for contact info under   Signed, Redell Shallow, MD  10/15/2024, 8:44 AM

## 2024-10-15 NOTE — Progress Notes (Addendum)
 STROKE TEAM PROGRESS NOTE   INTERIM HISTORY/SUBJECTIVE Readmitted with extension of previous strokes and hemorrhagic transformation. She was previously on xarelto . 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  Oncology following and recommending heparin  IV for anticoagulation at this time.  Palliative care has also been engaged, family has discussed eventual transition to hospice care but no decisions have been made yet.  Troponin has peaked and is now decreasing, likely due to demand ischemia.  No focal weakness on exam, she is still having confusion.  Vital signs stable.  OBJECTIVE  CBC    Component Value Date/Time   WBC 10.3 10/15/2024 0105   RBC 4.19 10/15/2024 0105   HGB 12.8 10/15/2024 0105   HGB 14.4 01/26/2012 1001   HCT 37.8 10/15/2024 0105   HCT 42.5 01/26/2012 1001   PLT 110 (L) 10/15/2024 0105   PLT 229 01/26/2012 1001   MCV 90.2 10/15/2024 0105   MCV 89.9 01/26/2012 1001   MCH 30.5 10/15/2024 0105   MCHC 33.9 10/15/2024 0105   RDW 13.3 10/15/2024 0105   RDW 13.9 01/26/2012 1001   LYMPHSABS 1.4 10/14/2024 1353   LYMPHSABS 1.6 01/26/2012 1001   MONOABS 0.8 10/14/2024 1353   MONOABS 0.4 01/26/2012 1001   EOSABS 0.1 10/14/2024 1353   EOSABS 0.1 01/26/2012 1001   BASOSABS 0.1 10/14/2024 1353   BASOSABS 0.0 01/26/2012 1001    BMET    Component Value Date/Time   NA 138 10/15/2024 0105   K 3.6 10/15/2024 0105   CL 103 10/15/2024 0105   CO2 27 10/15/2024 0105   GLUCOSE 108 (H) 10/15/2024 0105   BUN 9 10/15/2024 0105   CREATININE 0.86 10/15/2024 0105   CALCIUM  9.0 10/15/2024 0105   GFRNONAA >60 10/15/2024 0105    IMAGING past 24 hours MR BRAIN WO CONTRAST Result Date: 10/14/2024 EXAM: MRI BRAIN WITHOUT CONTRAST 10/14/2024 06:49:30 PM TECHNIQUE: Multiplanar multisequence MRI of the head/brain was performed without the administration of intravenous contrast. COMPARISON: 10/08/2024 CLINICAL HISTORY: Neuro deficit, acute, stroke  suspected. FINDINGS: BRAIN AND VENTRICLES: There are innumerable foci of abnormal diffusion restriction scattered throughout both cerebral and cerebellar hemispheres. The number of lesions has greatly increased compared to the prior study. There are a few scattered chronic microhemorrhages. No acute hemorrhage. No mass. No midline shift. No hydrocephalus. The sella is unremarkable. Normal flow voids. ORBITS: No acute abnormality. SINUSES AND MASTOIDS: No acute abnormality. BONES AND SOFT TISSUES: Normal marrow signal. Numerous cutaneous and subcutaneous lesions of the scalp. IMPRESSION: 1. Innumerable foci of acute/early subacute ischemia, with a greatly increased number of lesions compared to the prior study. 2. No acute hemorrhage. 3. 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 Electronically signed by: Franky Stanford MD 10/14/2024 07:13 PM EST RP Workstation: [id number]   CT Head Wo Contrast Result Date: 10/14/2024 CLINICAL DATA:  Altered level of consciousness EXAM: CT HEAD WITHOUT CONTRAST TECHNIQUE: Contiguous axial images were obtained from the base of the skull through the vertex without intravenous contrast. RADIATION DOSE REDUCTION: This exam was performed according to the departmental dose-optimization program which includes automated exposure control, adjustment of the mA and/or kV according to patient size and/or use of iterative reconstruction technique. COMPARISON:  10/08/2024 FINDINGS: Brain: There are scattered cortical hypodensities compatible with the multifocal acute infarcts seen on recent MRI. These are most pronounced within the right cerebellar hemisphere and left frontal parietal cortex. Within the left frontal parietal cortical infarct image 26/5 there is a 3 mm focus of petechial hemorrhage which  has developed in the interim. No new areas of infarct are identified. The lateral ventricles and midline structures are otherwise unremarkable. No mass effect. Vascular: Stable  atherosclerosis.  No hyperdense vessel. Skull: Multiple hyperdense cutaneous lesions are again identified, which may reflect numerous sebaceous cyst or neurofibromas. The calvarium is unremarkable. Sinuses/Orbits: Mild polypoid mucosal thickening within the right maxillary sinus. Remaining paranasal sinuses are clear. Other: None. IMPRESSION: 1. Scattered hypodensities within the bilateral cerebral cortices and right cerebellar hemisphere compatible with known acute cortical infarcts identified on the 10/08/2024 MRI. 2. Focal petechial hemorrhage within the left frontal parietal cortical infarct bed as above, measuring 3 mm. No mass effect. Critical Value/emergent results were called by telephone at the time of interpretation on 10/14/2024 at 3:52 pm to provider [facility] , who verbally acknowledged these results. Electronically Signed   By: Ozell Daring M.D.   On: 10/14/2024 15:57   CT ABDOMEN PELVIS W CONTRAST Result Date: 10/14/2024 CLINICAL DATA:  Altered mental status. Concern for pulmonary disease. Abdominal pain. EXAM: CT ANGIOGRAPHY CHEST CT ABDOMEN AND PELVIS WITH CONTRAST TECHNIQUE: Multidetector CT imaging of the chest was performed using the standard protocol during bolus administration of intravenous contrast. Multiplanar CT image reconstructions and MIPs were obtained to evaluate the vascular anatomy. Multidetector CT imaging of the abdomen and pelvis was performed using the standard protocol during bolus administration of intravenous contrast. RADIATION DOSE REDUCTION: This exam was performed according to the departmental dose-optimization program which includes automated exposure control, adjustment of the mA and/or kV according to patient size and/or use of iterative reconstruction technique. CONTRAST:  75mL OMNIPAQUE  IOHEXOL  350 MG/ML SOLN COMPARISON:  Chest radiograph dated 10/14/2024. FINDINGS: CTA CHEST FINDINGS Cardiovascular: There is no cardiomegaly or pericardial effusion. Mild  atherosclerotic calcification of the thoracic aorta. No aneurysmal dilatation or dissection. The origins of the great vessels of the aortic arch appear patent. No pulmonary artery embolus identified. Mediastinum/Nodes: No hilar or mediastinal adenopathy. The esophagus is grossly unremarkable. No mediastinal fluid collection. Lungs/Pleura: Faint diffuse hazy density throughout the lungs with mosaic attenuation may represent areas of air trapping and related to underlying small airway versus small vessel disease. No focal consolidation, pleural effusion, pneumothorax. The central airways are patent. Musculoskeletal: Degenerative changes of spine. No acute osseous pathology. Review of the MIP images confirms the above findings. CT ABDOMEN and PELVIS FINDINGS No intra-abdominal free air or free fluid. Hepatobiliary: Multiple hepatic hypoenhancing lesions measure up to 18 mm in the dome of the liver consistent with metastatic disease. Liver abscesses are less likely. Mild biliary dilatation, post cholecystectomy. Pancreas: There is a 1.5 x 2 cm hypoenhancing mass in the body of the pancreas consistent with primary pancreatic neoplasm. There is atrophy of the distal body of the pancreas. Spleen: Wedge shaped area of hypoenhancement involving the upper pole of the spleen most consistent with infarct. Adrenals/Urinary Tract: The adrenal glands unremarkable. Bilateral renal wedge-shaped hypoenhancing areas suspicious for infarct. An area of hypoenhancement involving the inferior pole of the right kidney may represent infarct or pyelonephritis. Correlation with urinalysis recommended. There is no hydronephrosis on either side. There is symmetric excretion of contrast by both kidneys. The visualized ureters and urinary bladder appear unremarkable. Stomach/Bowel: There is sigmoid diverticulosis. There is no bowel obstruction or active inflammation. The appendix is normal. Vascular/Lymphatic: Moderate aortoiliac atherosclerotic  disease. The IVC is unremarkable. No portal venous gas. There is no adenopathy. Reproductive: The uterus is retroverted or retroflexed. Anterior uterine fibroid noted. No suspicious adnexal masses. Other: None  Musculoskeletal: Osteopenia with degenerative changes spine. No acute osseous pathology. Review of the MIP images confirms the above findings. IMPRESSION: 1. No acute intrathoracic pathology. No pulmonary artery embolus identified. 2. A 1.5 x 2 cm hypoenhancing mass in the body of the pancreas consistent with primary pancreatic neoplasm. 3. Multiple hepatic hypoenhancing lesions consistent with metastatic disease. 4. Splenic and bilateral renal wedge-shaped hypoenhancing areas suspicious for infarcts. An area of hypoenhancement involving the inferior pole of the right kidney may represent infarct or pyelonephritis. Correlation with urinalysis recommended. 5. Sigmoid diverticulosis. No bowel obstruction. Normal appendix. 6.  Aortic Atherosclerosis (ICD10-I70.0). Electronically Signed   By: Vanetta Chou M.D.   On: 10/14/2024 15:51   CT Angio Chest PE W and/or Wo Contrast Result Date: 10/14/2024 CLINICAL DATA:  Altered mental status. Concern for pulmonary disease. Abdominal pain. EXAM: CT ANGIOGRAPHY CHEST CT ABDOMEN AND PELVIS WITH CONTRAST TECHNIQUE: Multidetector CT imaging of the chest was performed using the standard protocol during bolus administration of intravenous contrast. Multiplanar CT image reconstructions and MIPs were obtained to evaluate the vascular anatomy. Multidetector CT imaging of the abdomen and pelvis was performed using the standard protocol during bolus administration of intravenous contrast. RADIATION DOSE REDUCTION: This exam was performed according to the departmental dose-optimization program which includes automated exposure control, adjustment of the mA and/or kV according to patient size and/or use of iterative reconstruction technique. CONTRAST:  75mL OMNIPAQUE  IOHEXOL   350 MG/ML SOLN COMPARISON:  Chest radiograph dated 10/14/2024. FINDINGS: CTA CHEST FINDINGS Cardiovascular: There is no cardiomegaly or pericardial effusion. Mild atherosclerotic calcification of the thoracic aorta. No aneurysmal dilatation or dissection. The origins of the great vessels of the aortic arch appear patent. No pulmonary artery embolus identified. Mediastinum/Nodes: No hilar or mediastinal adenopathy. The esophagus is grossly unremarkable. No mediastinal fluid collection. Lungs/Pleura: Faint diffuse hazy density throughout the lungs with mosaic attenuation may represent areas of air trapping and related to underlying small airway versus small vessel disease. No focal consolidation, pleural effusion, pneumothorax. The central airways are patent. Musculoskeletal: Degenerative changes of spine. No acute osseous pathology. Review of the MIP images confirms the above findings. CT ABDOMEN and PELVIS FINDINGS No intra-abdominal free air or free fluid. Hepatobiliary: Multiple hepatic hypoenhancing lesions measure up to 18 mm in the dome of the liver consistent with metastatic disease. Liver abscesses are less likely. Mild biliary dilatation, post cholecystectomy. Pancreas: There is a 1.5 x 2 cm hypoenhancing mass in the body of the pancreas consistent with primary pancreatic neoplasm. There is atrophy of the distal body of the pancreas. Spleen: Wedge shaped area of hypoenhancement involving the upper pole of the spleen most consistent with infarct. Adrenals/Urinary Tract: The adrenal glands unremarkable. Bilateral renal wedge-shaped hypoenhancing areas suspicious for infarct. An area of hypoenhancement involving the inferior pole of the right kidney may represent infarct or pyelonephritis. Correlation with urinalysis recommended. There is no hydronephrosis on either side. There is symmetric excretion of contrast by both kidneys. The visualized ureters and urinary bladder appear unremarkable. Stomach/Bowel:  There is sigmoid diverticulosis. There is no bowel obstruction or active inflammation. The appendix is normal. Vascular/Lymphatic: Moderate aortoiliac atherosclerotic disease. The IVC is unremarkable. No portal venous gas. There is no adenopathy. Reproductive: The uterus is retroverted or retroflexed. Anterior uterine fibroid noted. No suspicious adnexal masses. Other: None Musculoskeletal: Osteopenia with degenerative changes spine. No acute osseous pathology. Review of the MIP images confirms the above findings. IMPRESSION: 1. No acute intrathoracic pathology. No pulmonary artery embolus identified. 2. A 1.5 x  2 cm hypoenhancing mass in the body of the pancreas consistent with primary pancreatic neoplasm. 3. Multiple hepatic hypoenhancing lesions consistent with metastatic disease. 4. Splenic and bilateral renal wedge-shaped hypoenhancing areas suspicious for infarcts. An area of hypoenhancement involving the inferior pole of the right kidney may represent infarct or pyelonephritis. Correlation with urinalysis recommended. 5. Sigmoid diverticulosis. No bowel obstruction. Normal appendix. 6.  Aortic Atherosclerosis (ICD10-I70.0). Electronically Signed   By: Vanetta Chou M.D.   On: 10/14/2024 15:51   DG Chest Port 1 View Result Date: 10/14/2024 CLINICAL DATA:  Questionable sepsis - evaluate for abnormality EXAM: PORTABLE CHEST 1 VIEW COMPARISON:  May 06, 2022 FINDINGS: The cardiomediastinal silhouette is unchanged in contour.Atherosclerotic calcifications. No pleural effusion. No pneumothorax. No acute pleuroparenchymal abnormality. IMPRESSION: No acute cardiopulmonary abnormality. Electronically Signed   By: Corean Salter M.D.   On: 10/14/2024 14:10    Vitals:   10/15/24 0645 10/15/24 0700 10/15/24 0715 10/15/24 0731  BP: (!) 141/84 (!) 147/79 (!) 155/93 128/78  Pulse: 67 71 71 84  Resp: 14   16  Temp:    97.8 F (36.6 C)  TempSrc:    Oral  SpO2: 97% 98% 97% 92%  Weight:      Height:          PHYSICAL EXAM General:  Alert, well-nourished, well-developed patient in no acute distress Psych:  Mood and affect appropriate for situation CV: Regular rate and rhythm on monitor Respiratory:  Regular, unlabored respirations on room air GI: Abdomen soft and nontender   NEURO:  Mental Status: alert and oriented to self, some confusion regarding date. Able to identify family members  Speech/Language: speech is without dysarthria or aphasia.  Naming, repetition, fluency, and comprehension intact.  Cranial Nerves:  II: PERRL. Visual fields full.  III, IV, VI: EOMI. Eyelids elevate symmetrically.  V: Sensation is intact to light touch and symmetrical to face.  VII: Face is symmetrical resting and smiling VIII: hearing intact to voice. IX, X: Palate elevates symmetrically. Phonation is normal.  KP:Dynloizm shrug 5/5. XII: tongue is midline without fasciculations. Motor: 5/5 strength to all muscle groups tested.  Tone: is normal and bulk is normal Sensation- Intact to light touch bilaterally but does have some left right confusion  Coordination: FTN intact bilaterally, HKS: no ataxia in BLE.No drift. Slowed movements in bilateral upper extremities Gait- deferred  Most Recent NIH 2     ASSESSMENT/PLAN  Ms. EUGENE ZEIDERS is a 78 y.o. female with  afibb on xarelto , hyperlipidemia, prior strokes, breast cancer who presents with worsening confusion.  NIH on Admission 2  Stroke:  bilateral embolic shower with small hemorrhagic transformation of left fontal infarct, etiology: Likely hypercoagulable state in the setting of advanced malignancy.  CT head Scattered hypodensities within the bilateral cerebral cortices and right cerebellar hemisphere compatible with known acute cortical infarcts identified on the 10/08/2024 MRI.Focal petechial hemorrhage within the left frontal parietal cortical infarct bed, measuring 3 mm. No mass effect. MRI Innumerable foci of acute/early subacute  ischemia, with a greatly increased number of lesions compared to the prior study.  No acute hemorrhage. VTE prophylaxis - heparin  IV Xarelto  (rivaroxaban ) daily prior to admission, now on heparin  IV. Consider transition to lovenox if tolerating heparin  IV for 48 hours Therapy recommendations: Pending Disposition:  pending   Recent stroke 10/10/2024 admitted for left-sided numbness.  MRI showed bilateral multifocal small punctate embolic shower.  MRI and carotid Doppler unremarkable.  EF 60 to 65%.  LDL 183, A1c 5.4.  Considered embolic stroke due to A-fib even on Xarelto .  Discharged on Xarelto  and Lipitor  80.  Pancreatic Mass, likely metastatic pancreatic cancer Multiple liver lesions metastasis 1.5 x 2 cm mass of the pancreas on CT consistent with neoplasm.  Multiple hepatic hypoenhancing lesions consistent with metastatic disease Splenic and renal infarcts - likely embolic in the setting of cancer/hypercoagulable state Oncology following- leaning towards hospice, not recommending chemo at this time Palliative following  Atrial fibrillation Home Meds: Diltiazem  30 mg every 4 hours as needed, Xarelto  20 mg daily Continue telemetry monitoring On heparin  IV  History of DVT 08/2024 LE venous Doppler showed right peroneal vein acute DVT.  Continue Xarelto  at that time Now on heparin  IV  HTN Stable now BP goal less than 160 given on AC Long-term BP goal normotensive  Hyperlipidemia Home meds simvastatin  40mg  LDL 183, goal < 70 No escalation of statin given prognosis and potential hospice Continue statin at this time  Elevated troponin No chest pain or STEMI on EKG Cardiology consulted and suspect demand ischemia  Troponin 2690 -> 2529 ->2187 On heparin  IV now  Other stroke risk factors Advanced age  Other medical issues History of breast cancer   Hospital day # 1  Patient seen and examined by NP/APP with MD. MD to update note as needed.   Jorene Last, DNP,  FNP-BC [facility] Pager: [phone]  ATTENDING NOTE: I reviewed above note and agree with the assessment and plan. Pt was seen and examined.   Daughter and son-in-law are at bedside.  Patient sitting in bed, awake and alert, orientated to place and people, however not orientated to age or time.  Otherwise neurologically intact no focal deficit.  Patient does have embolic shower extended from last admission, as well as recent DVT, etiology likely due to advanced malignancy with hypercoagulable state.  Oncology on board, recommend hospice.  Now on heparin  IV, will eventually transition to Lovenox if appropriate.  Continue home Zocor  at this time.  PT and OT pending.  For detailed assessment and plan, please refer to above as I have made changes wherever appropriate.   Neurology will sign off. Please call with questions. Thanks for the consult.  Ary Cummins, MD PhD Stroke Neurology 10/15/2024 6:22 PM  I discussed with Dr. Drusilla and Dr. Timmy. I spent extensive total face-to-face time with the patient and daughter and son-in-law, reviewing test results, images and medication, and discussing the diagnosis, treatment plan and potential prognosis. This patient's care requiresreview of multiple databases, neurological assessment, discussion with family, other specialists and medical decision making of high complexity.       To contact Stroke Continuity provider, please refer to [email or web address]. After hours, contact General Neurology

## 2024-10-15 NOTE — Progress Notes (Signed)
 PHARMACY - ANTICOAGULATION CONSULT NOTE  Pharmacy Consult for IV heparin  Indication: atrial fibrillation  Allergies  Allergen Reactions   Naproxen Sodium Hives   Neomycin-Bacitracin Zn-Polymyx Swelling    In ear, where it was applied.    Tetracycline Rash    All over the body   Apixaban Hives   Crestor [Rosuvastatin] Other (See Comments)    Joint pain   Lipitor  [Atorvastatin ] Other (See Comments)    Joint pain and weakness.    Advil [Ibuprofen] Rash   Other Rash    ALL TAPES-RASH (PT HAS LEAST RXN TO PAPER TAPE)    Patient Measurements: Height: 5' 4 (162.6 cm) Weight: 73.9 kg (163 lb) IBW/kg (Calculated) : 54.7 HEPARIN  DW (KG): 70  Vital Signs: Temp: 98 F (36.7 C) (11/23 2010) Temp Source: Oral (11/23 2010) BP: 157/75 (11/23 2010) Pulse Rate: 81 (11/23 2010)  Labs: Recent Labs    10/14/24 1353 10/14/24 1407 10/14/24 1602 10/14/24 2110 10/15/24 0105 10/15/24 2114  HGB 14.3 14.6  --   --  12.8  --   HCT 42.0 43.0  --   --  37.8  --   PLT 125*  --   --   --  110*  --   APTT  --   --   --   --   --  30  LABPROT 25.0*  --   --   --   --   --   INR 2.1*  --   --   --   --   --   CREATININE 0.80 0.80  --   --  0.86  --   TROPONINIHS 8,202*  --  2,690* 2,529* 2,187*  --     Estimated Creatinine Clearance: 52.3 mL/min (by C-G formula based on SCr of 0.86 mg/dL).   Medical History: Past Medical History:  Diagnosis Date   Anxiety    Arthritis    Atrial fibrillation (HCC)    Benign colon polyp    Cancer (HCC) 2012   breast- left   Complication of anesthesia    Diverticulosis    Dysrhythmia    History of breast cancer    s/p lumpectomy and HRT   Hyperlipidemia    intolerant of all cholesterol meds   Obesity    Palpitations    Pancreatic cancer metastasized to liver (HCC) 10/15/2024   PONV (postoperative nausea and vomiting)    Rheumatoid factor positive 02/09/2020    Medications:  Infusions:   heparin  600 Units/hr (10/15/24 1226)    Assessment: 78 yo female with metastatic cancer, on Xarelto  PTA.  Also admitted with ICH, after discussions with cards, neuro and oncology MD, pharmacy asked to start IV heparin  with low dose/stroke dosing.  aPTT 30 is subtherapeutic on 600 units/hr. RN reports IV pump has alarmed multiple times throughout the shift. No overt signs of bleeding noted.  Goal of Therapy:  Heparin  level 0.3-0.5 Monitor platelets by anticoagulation protocol: Yes   Plan:  Increase IV heparin  without bolus at 750 units/hr. Check aPTT and heparin  level in 8 hrs. Daily CBC, heparin  level and aPTT. Planning for lovenox at dc, will check copays Monday AM.  Harlene Barlow, Berdine BIRCH, BCPS, [facility] Clinical Pharmacist  10/15/2024 9:59 PM   [facility] pharmacy phone numbers are listed on [email or web address]

## 2024-10-15 NOTE — Consult Note (Signed)
 [name] [name] is a very nice 78 year old white female.  She is a retired engineer, civil (consulting).  She actually worked for [employer].  She is originally from [place] .  She has been in good health.  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  She actually went to [facility] in early October for surgery.  She developed a blood clot in the right leg.  She was placed on the Xarelto .  She has a long history of atrial fibrillation.  However, I do not think she was on any anticoagulation for that.  She was discharged on 10/09/2024 after having embolic infarcts.  Again she had been on anticoagulation with Xarelto .  Neurology saw her.  They recommended a statin.  She was continued on Xarelto .  She subsequently was readmitted on 10/14/2024.  She was lethargic.  She was weak.  She had some cognitive difficulties.  She subsequently had a MRI of the brain which showed innumerable infarcts.  These were certainly more so than previously noted.  Her workup included a CT scan of the body.  She was found to have a pancreatic mass with hepatic metastasis.  Is then felt that the thromboembolisms that she had were secondary to pancreatic cancer (i.e. Trousseau's syndrome).  She is still somewhat cognitively deficient.  She has little bit of a flat affect.  Her labs today show white cell count 10.3.  Hemoglobin 12.8.  Platelet count 110,000.  Her sodium 138.  Potassium 3.6.  BUN 9 creatinine 0.86.  Calcium  9 with an albumin of 3.3.  Her bilirubin 1.4.  She has a markedly elevated troponin I.  There is a history of cancer in the family.  Their mother had cancer.  She is not sure what she had.  She has never smoked..  She has had COVID.  She had about 3 years ago.  She is not that hungry.  I think she may have lost a little bit of weight.  Overall, I would have just that her performance status is probably ECOG 3.   Vital signs show temperature of 97.8.  Pulse 84.  Blood pressure 128/78.  Her head and neck exam  shows no ocular or oral lesions.  There are no palpable cervical or supraclavicular lymph nodes.  Lungs are clear bilaterally.  Cardiac exam regular rate and rhythm.  She has no murmurs, rubs or bruits.  Abdomen is soft.  She has good bowel sounds.  There is no fluid wave.  There is no palpable liver or spleen tip.  Extremities shows a little bit of swelling in the legs.  Neurological exam shows no focal deficits.  She does have some cognitive dysfunction.    [name] [name] is a very charming 78 year old white female.  She has metastatic pancreatic cancer.  I have to believe that this is probably driving a lot of the thromboembolisms that she has.  Clearly, she had this cancer back in October.  I am sure that the thrombus that she had in the leg is related to her having the cancer in addition to having the surgery.  I am not surprised Xarelto  was not that effective.  A lot of times, for these malignant driven cancers, you need to use heparin  or Lovenox.  I had a long talk with the patient and her family.  I am not sure how much Mr. Toya really comprehended.  However her daughter certainly understood.  She, in my opinion, would best be served by Hospice.  I just  do not see that she would be a good candidate for chemotherapy.  I just do not think that she has a good enough performance status for chemotherapy.  I think chemotherapy would do more harm than good.  In private, I talked to her daughter and son-in-law.  I told him that I thought that we probably are looking at 2-3 months at most.  I just do not think that she is going to be all that active.  I worried that the cognitive impairments will certainly worsen.  They are in agreement to Hospice.  However, her daughter does not think that she is going be able to care for her at home.  I am unsure if she would be a candidate for [facility].  I think this would really be a nice facility for her but again, I am not sure that she would fit the criteria right  now.  However, it would certainly be worthwhile looking into.  I really have to applaud Ms. Shyrl for being a engineer, civil (consulting) for so long.  She was in the trenches and I am sure that many patients benefited from her compassion and from her experience.  At this point, the top priority is quality of life.  I really do not think that given her chemotherapy is going to improve her quality of life and in fact, we will likely decrease the quality of life given the toxicity and the low likelihood of response.  I agree with the DNR status.  We will follow along and try to help out any way that we can.   Jeralyn Crease, MD  2 Timothy 4:6-8

## 2024-10-15 NOTE — Progress Notes (Signed)
 [facility]  PROGRESS NOTE  [name] [name] FMW:[id number] DOB: 1946/08/02 DOA: 10/14/2024 PCP: Ozell Heron [name], MD   Brief HPI:   78 y.o. female with medical history significant of hyperlipidemia, CVA, breast cancer presenting with worsening mentation.  Patient was admitted 11/16-11/19 with right-sided deficits and found to have CVA suspicious for embolic etiology.  Patient was on Xarelto  and on discharge was counseled on taking it the same way every day with food.     Patient discharged 5 days ago and for the past 2 days family has noticed decreased activity, decreased p.o. intake, weakness, confusion. In the ED CT of the head showed new focal petechial hemorrhage, CT abdomen/pelvis showed 1.5 x 2 cm hypoechoic mass of the pancreas consistent with neoplasm also showed multiple liver lesions consistent with metastasis. Also found to have NSTEMI with troponin elevated to 1797 Cardiology, neurology were consulted   Assessment/Plan:   Pancreatic mass/likely pancreatic cancer - CT abdomen/pelvis showed 1.5 x 2 cm mass of pancreas on CT, consistent with neoplasm -Also noted to have multiple liver lesions -Recent infarcts including splenic infarct, renal infarct and thrombophlebitis while being on Xarelto  unlikely due to underlying neoplasm - Palliative care consulted, patient is currently DNR - Discussed with patient's daughter regarding further management, will consult oncology for further recommendations, patient is a poor candidate for chemotherapy; will be an ideal candidate for hospice - Further plan of care after discussion with oncology  Acute encephalopathy/intracerebral hemorrhage - Presented with confusion, weakness, decreased p.o. intake - CT head was consistent with prior infection but also showed new focal petechial hemorrhage - Neurology was consulted - MRI brain showed innumerable foci of acute/early subacute ischemia with a greatly decreased number of lesions compared to  prior study, no acute hemorrhage  NSTEMI - Likely in setting of demand ischemia - Troponin was elevated to 2000, ECG showed mild inferolateral T wave inversion, likely demand ischemi - Cardiology was consulted, no further intervention recommended  Recent CVA/splenic infarct/renal infarct/recent DVT - She has a history of atrial fibrillation -Also diagnosed with pancreatic cancer; with likely hypercoagulable state - Cardiology recommends her to be on long-term anticoagulation - Will consult pharmacy for IV heparin   History of recent CVA - Continue simvastatin  - Xarelto  is currently on hold - Will start heparin  per pharmacy  History of breast cancer - Noted  Goals of care - Patient has poor prognosis considering metastatic pancreatic cancer with multiple comorbid conditions.  Will not be a good candidate for chemotherapy.  Will consult oncology for further discussion and recommendations       DVT prophylaxis: Heparin   Medications     lidocaine   1 patch Transdermal Q24H   lidocaine   1 patch Transdermal Once   LORazepam   1 mg Oral Once   simvastatin   40 mg Oral QHS   sodium chloride  flush  3 mL Intravenous Q12H     Data Reviewed:   CBG:  Recent Labs  Lab 10/08/24 1402 10/09/24 2134 10/14/24 1337  GLUCAP 91 111* 128*    SpO2: 92 %    Vitals:   10/15/24 0645 10/15/24 0700 10/15/24 0715 10/15/24 0731  BP: (!) 141/84 (!) 147/79 (!) 155/93 128/78  Pulse: 67 71 71 84  Resp: 14   16  Temp:    97.8 F (36.6 C)  TempSrc:    Oral  SpO2: 97% 98% 97% 92%  Weight:      Height:          Data Reviewed:  Basic  Metabolic Panel: Recent Labs  Lab 10/08/24 1858 10/14/24 1353 10/14/24 1407 10/15/24 0105  NA 140 139 140 138  K 4.0 3.7 3.7 3.6  CL 106 104 103 103  CO2 23 20*  --  27  GLUCOSE 92 110* 119* 108*  BUN 14 10 11 9   CREATININE 0.78 0.80 0.80 0.86  CALCIUM  9.2 9.3  --  9.0    CBC: Recent Labs  Lab 10/08/24 1858 10/14/24 1353 10/14/24 1407  10/15/24 0105  WBC 6.5 9.8  --  10.3  NEUTROABS  --  7.4  --   --   HGB 13.5 14.3 14.6 12.8  HCT 41.3 42.0 43.0 37.8  MCV 93.0 89.4  --  90.2  PLT 201 125*  --  110*    LFT Recent Labs  Lab 10/08/24 1858 10/14/24 1353 10/15/24 0105  AST 31 48* 42*  ALT 36 47* 38  ALKPHOS 142* 151* 131*  BILITOT 1.2 1.2 1.4*  PROT 6.8 6.9 6.4*  ALBUMIN 3.7 3.5 3.3*     Antibiotics: Anti-infectives (From admission, onward)    None        CONSULTS cardiology, neurology  Code Status: DNR  Family Communication: Discussed with patient daughter and her husband at bedside     Subjective   Patient complains of nausea and anxiety.   Objective    Physical Examination:   Alert, oriented x 3, lacks insight and judgment S1-S2, regular Lungs clear to auscultation bilaterally Abdomen is soft, nontender            [name] S [name]   [facility] If 7PM-7AM, please contact night-coverage at [email or web address], Office  [phone]   10/15/2024, 9:57 AM  LOS: 1 day

## 2024-10-15 NOTE — Plan of Care (Signed)
  Problem: Activity: Goal: Risk for activity intolerance will decrease Outcome: Progressing   Problem: Nutrition: Goal: Adequate nutrition will be maintained Outcome: Progressing   Problem: Coping: Goal: Level of anxiety will decrease Outcome: Progressing   Problem: Elimination: Goal: Will not experience complications related to urinary retention Outcome: Progressing   Problem: Pain Managment: Goal: General experience of comfort will improve and/or be controlled Outcome: Progressing

## 2024-10-16 ENCOUNTER — Inpatient Hospital Stay: Admitting: Family Medicine

## 2024-10-16 ENCOUNTER — Other Ambulatory Visit (HOSPITAL_COMMUNITY): Payer: Self-pay

## 2024-10-16 ENCOUNTER — Telehealth (HOSPITAL_COMMUNITY): Payer: Self-pay | Admitting: Pharmacy Technician

## 2024-10-16 DIAGNOSIS — Z711 Person with feared health complaint in whom no diagnosis is made: Secondary | ICD-10-CM

## 2024-10-16 DIAGNOSIS — I639 Cerebral infarction, unspecified: Secondary | ICD-10-CM

## 2024-10-16 DIAGNOSIS — Z7189 Other specified counseling: Secondary | ICD-10-CM | POA: Diagnosis not present

## 2024-10-16 DIAGNOSIS — Z515 Encounter for palliative care: Secondary | ICD-10-CM | POA: Diagnosis not present

## 2024-10-16 LAB — APTT
aPTT: 38 s — ABNORMAL HIGH (ref 24–36)
aPTT: 48 s — ABNORMAL HIGH (ref 24–36)

## 2024-10-16 LAB — HEPARIN LEVEL (UNFRACTIONATED): Heparin Unfractionated: 0.47 [IU]/mL (ref 0.30–0.70)

## 2024-10-16 NOTE — Progress Notes (Addendum)
 PHARMACY - ANTICOAGULATION CONSULT NOTE  Pharmacy Consult for IV heparin  Indication: atrial fibrillation  Allergies  Allergen Reactions   Naproxen Sodium Hives   Neomycin-Bacitracin Zn-Polymyx Swelling    In ear, where it was applied.    Tetracycline Rash    All over the body   Apixaban Hives   Crestor [Rosuvastatin] Other (See Comments)    Joint pain   Lipitor  [Atorvastatin ] Other (See Comments)    Joint pain and weakness.    Advil [Ibuprofen] Rash   Other Rash    ALL TAPES-RASH (PT HAS LEAST RXN TO PAPER TAPE)    Patient Measurements: Height: 5' 4 (162.6 cm) Weight: 73.9 kg (163 lb) IBW/kg (Calculated) : 54.7 HEPARIN  DW (KG): 70  Vital Signs: Temp: 97.9 F (36.6 C) (11/24 0347) Temp Source: Oral (11/24 0347) BP: 128/67 (11/24 0347) Pulse Rate: 64 (11/24 0347)  Labs: Recent Labs    10/14/24 1353 10/14/24 1407 10/14/24 1602 10/14/24 2110 10/15/24 0105 10/15/24 2114 10/16/24 0645  HGB 14.3 14.6  --   --  12.8  --   --   HCT 42.0 43.0  --   --  37.8  --   --   PLT 125*  --   --   --  110*  --   --   APTT  --   --   --   --   --  30 38*  LABPROT 25.0*  --   --   --   --   --   --   INR 2.1*  --   --   --   --   --   --   HEPARINUNFRC  --   --   --   --   --  0.67 0.47  CREATININE 0.80 0.80  --   --  0.86  --   --   TROPONINIHS 8,202*  --  2,690* 2,529* 2,187*  --   --     Estimated Creatinine Clearance: 52.3 mL/min (by C-G formula based on SCr of 0.86 mg/dL).   Medical History: Past Medical History:  Diagnosis Date   Anxiety    Arthritis    Atrial fibrillation (HCC)    Benign colon polyp    Cancer (HCC) 2012   breast- left   Complication of anesthesia    Diverticulosis    Dysrhythmia    History of breast cancer    s/p lumpectomy and HRT   Hyperlipidemia    intolerant of all cholesterol meds   Obesity    Palpitations    Pancreatic cancer metastasized to liver (HCC) 10/15/2024   PONV (postoperative nausea and vomiting)    Rheumatoid factor  positive 02/09/2020    Medications:  Infusions:   heparin  750 Units/hr (10/15/24 2204)   Assessment: 78 yo female with metastatic cancer, on Xarelto  PTA, last dose 11/22 at 11am.  Also admitted with ICH, after discussions with cards, neuro and oncology MD, pharmacy asked to start IV heparin  with low dose/stroke dosing.  aPTT 38 is subtherapeutic on 750 units/hr.  No issues with infusion or bleeding per RN. Heparin  level is not correlating.   Goal of Therapy:  Heparin  level 0.3-0.5 aPTT 66-85 Monitor platelets by anticoagulation protocol: Yes   Plan:  Increase IV heparin  to 900 units/hr, no bolus Check aPTT in 8 hrs Monitor daily aPTT, heparin  level, CBC, signs/symptoms of bleeding  Planning for lovenox at dc   Jinnie Door, PharmD, BCPS, BCCP Clinical Pharmacist  Please check AMION for all  [facility] phone numbers After 10:00 PM, call Main Pharmacy [phone]

## 2024-10-16 NOTE — Progress Notes (Signed)
 [facility]  PROGRESS NOTE  [name] [name] FMW:[id number] DOB: 1946/07/01 DOA: 10/14/2024 PCP: Ozell Heron [name], MD   Brief HPI:   78 y.o. female with medical history significant of hyperlipidemia, CVA, breast cancer presenting with worsening mentation.  Patient was admitted 11/16-11/19 with right-sided deficits and found to have CVA suspicious for embolic etiology.  Patient was on Xarelto  and on discharge was counseled on taking it the same way every day with food.     Patient discharged 5 days ago and for the past 2 days family has noticed decreased activity, decreased p.o. intake, weakness, confusion. In the ED CT of the head showed new focal petechial hemorrhage, CT abdomen/pelvis showed 1.5 x 2 cm hypoechoic mass of the pancreas consistent with neoplasm also showed multiple liver lesions consistent with metastasis. Also found to have NSTEMI with troponin elevated to 1797 Cardiology, neurology were consulted   Assessment/Plan:   Pancreatic mass/likely pancreatic cancer - CT abdomen/pelvis showed 1.5 x 2 cm mass of pancreas on CT, consistent with neoplasm -Also noted to have multiple liver lesions -Recent infarcts including splenic infarct, renal infarct and thrombophlebitis while being on Xarelto  unlikely due to underlying neoplasm - Palliative care consulted, patient is currently DNR - Discussed with patient's daughter regarding further management, will consult oncology for further recommendations, patient is a poor candidate for chemotherapy; will be an ideal candidate for hospice - Oncology consulted, recommend hospice.  Not a candidate for chemotherapy. - Family has decided to go with hospice. - Will consult TOC for home with hospice  Acute encephalopathy/intracerebral hemorrhage - Presented with confusion, weakness, decreased p.o. intake - CT head was consistent with prior infection but also showed new focal petechial hemorrhage - Neurology was consulted - MRI brain  showed innumerable foci of acute/early subacute ischemia with a greatly decreased number of lesions compared to prior study, no acute hemorrhage  NSTEMI - Likely in setting of demand ischemia - Troponin was elevated to 2000, ECG showed mild inferolateral T wave inversion, likely demand ischemi - Cardiology was consulted, no further intervention recommended  Recent CVA/splenic infarct/renal infarct/recent DVT - She has a history of atrial fibrillation -Also diagnosed with pancreatic cancer; with likely hypercoagulable state - Cardiology recommends her to be on long-term anticoagulation - IV heparin  per pharmacy  History of recent CVA - Continue simvastatin  - Xarelto  is currently on hold - Heparin  per pharmacy  History of breast cancer - Noted  Goals of care - Patient has poor prognosis considering metastatic pancreatic cancer with multiple comorbid conditions.  Will not be a good candidate for chemotherapy.  Family decided for hospice.  Will consult TOC for home with hospice      DVT prophylaxis: Heparin   Medications     lidocaine   1 patch Transdermal Q24H   simvastatin   40 mg Oral QHS   sodium chloride  flush  3 mL Intravenous Q12H     Data Reviewed:   CBG:  Recent Labs  Lab 10/09/24 2134 10/14/24 1337  GLUCAP 111* 128*    SpO2: 96 %    Vitals:   10/15/24 1604 10/15/24 2010 10/15/24 2354 10/16/24 0347  BP: 139/68 (!) 157/75 130/67 128/67  Pulse: 80 81 72 64  Resp: 16 18 17 18   Temp: 98.7 F (37.1 C) 98 F (36.7 C) 97.7 F (36.5 C) 97.9 F (36.6 C)  TempSrc: Oral Oral Oral Oral  SpO2: 97% 97% 96% 96%  Weight:      Height:  Data Reviewed:  Basic Metabolic Panel: Recent Labs  Lab 10/14/24 1353 10/14/24 1407 10/15/24 0105  NA 139 140 138  K 3.7 3.7 3.6  CL 104 103 103  CO2 20*  --  27  GLUCOSE 110* 119* 108*  BUN 10 11 9   CREATININE 0.80 0.80 0.86  CALCIUM  9.3  --  9.0    CBC: Recent Labs  Lab 10/14/24 1353 10/14/24 1407  10/15/24 0105  WBC 9.8  --  10.3  NEUTROABS 7.4  --   --   HGB 14.3 14.6 12.8  HCT 42.0 43.0 37.8  MCV 89.4  --  90.2  PLT 125*  --  110*    LFT Recent Labs  Lab 10/14/24 1353 10/15/24 0105  AST 48* 42*  ALT 47* 38  ALKPHOS 151* 131*  BILITOT 1.2 1.4*  PROT 6.9 6.4*  ALBUMIN 3.5 3.3*     Antibiotics: Anti-infectives (From admission, onward)    None        CONSULTS cardiology, neurology  Code Status: DNR  Family Communication: Discussed with patient daughter and her husband at bedside     Subjective   Denies any pain   Objective    Physical Examination:  Lethargic, following commands S1-S2, regular Lungs clear to auscultation bilaterally Abdomen is soft, nontender            Riannon Mukherjee S Cathlyn Tersigni   [facility] If 7PM-7AM, please contact night-coverage at [email or web address], Office  [phone]   10/16/2024, 8:23 AM  LOS: 2 days

## 2024-10-16 NOTE — Plan of Care (Signed)
  Problem: Clinical Measurements: Goal: Respiratory complications will improve Outcome: Progressing Goal: Cardiovascular complication will be avoided Outcome: Progressing   Problem: Activity: Goal: Risk for activity intolerance will decrease Outcome: Progressing   Problem: Coping: Goal: Level of anxiety will decrease Outcome: Progressing   Problem: Elimination: Goal: Will not experience complications related to urinary retention Outcome: Progressing   Problem: Skin Integrity: Goal: Risk for impaired skin integrity will decrease Outcome: Progressing

## 2024-10-16 NOTE — Progress Notes (Signed)
 Palliative Medicine Inpatient Follow Up Note HPI: Recent embolic stroke. Now presenting with confusion and found to have small intracerebral hemorrhage. 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 Troponin also significantly elevated with limited options if ongoing intracerebral hemorrhage. MRI brain ordered to further evaluate bleeding.  Not on options for infarcts if ongoing bleeding.  Also with new pancreatic mass suspicious for neoplasm  long-term prognosis is poor.  Family has agreed to DNR/DNI.  I notified them that I would be consulting palliative for ongoing discussions about goals of care while continuing workup given some of her treatment options will likely be limited.   Today's Discussion 10/16/2024  *Please note that this is a verbal dictation therefore any spelling or grammatical errors are due to the Dragon Medical One system interpretation.   I reviewed the chart notes including nursing notes from today, progress notes from today. I also reviewed vital signs, nursing flowsheets, medication administrations record, labs, and imaging.    I met with [name] [name]'s son, daughter, and son in law at bedside this morning. We reviewed the conversations that had been held with Dr. Timmy.  We discussed the recommendation of hospice care.  Patient's family are curious to know what that would look like and I recommended having the hospice agency themselves meet to talk about the options moving forward.  We discussed the various settings for which hospice could take place and inclusive of home or an inpatient hospice setting for patients who are days to weeks from the end-of-life.  Patient's son feels home is what the patient would want however patient's other son plans to come this evening and the family would like to be consensus.  We discussed the various hospice agencies locally and patient's family of selected hospice of the [place].  I  shared openly and honestly the expectations of family when someone goes home with hospice versus not.  We discussed the option of transitioning her focus towards comfort mediated care for [facility].  Will patient's daughter [name] agrees this is the best next step she also wants both brothers to be involved in the decision making.  Patient's daughter [name] also requests Dr. Timmy meet with them as a family.  I was able to share that he plans to round tomorrow morning around 5:30 AM if family would like to meet with him which she is going to inform the rest of her family members of.  I spoken to the medical social work team as well as hospice of the [employer], [place] -plan to meet tomorrow.  Patient's daughter has requested palliative's presence during this meeting.  Questions and concerns addressed/Palliative Support Provided.   Objective Assessment: Vital Signs Vitals:   10/16/24 0347 10/16/24 0730  BP: 128/67 124/68  Pulse: 64 98  Resp: 18 16  Temp: 97.9 F (36.6 C) 98 F (36.7 C)  SpO2: 96% 97%    Intake/Output Summary (Last 24 hours) at 10/16/2024 1351 Last data filed at 10/16/2024 0430 Gross per 24 hour  Intake 90 ml  Output 500 ml  Net -410 ml   Last Weight  Most recent update: 10/14/2024  1:43 PM    Weight  73.9 kg (163 lb)            Gen: Elderly Caucasian female HEENT: Dry mucous membranes CV: Regular rate and irregular rhythm  PULM: On room air breathing is even and nonlabored ABD: soft/nontender  EXT: Bilateral lower extremity edema +1 pitting Neuro: Alert and oriented  to self  SUMMARY OF RECOMMENDATIONS   DNAR/DNI   Appreciate oncology consultation - they have recommended patient transition to hospice care.  Family plan to meet with Dr. Timmy tomorrow morning at 530.  Plan for family meeting tomorrow with [facility] [place] to determine best path moving forward --> whether that be home with hospice versus inpatient hospice   Ongoing  palliative care support   Code Status/Advance Care Planning: DNAR/DNI   Symptom Management:  Back pain: - Continue Tylenol  as needed, Norco as needed, morphine  as needed   Delirium: - Maintenance of strict delirium precautions ______________________________________________________________________________________ Rosaline Becton [place] Palliative Medicine Team Team Cell Phone: [phone] Please utilize secure chat with additional questions, if there is no response within 30 minutes please call the above phone number  Time Spent: 68 Billing based on MDM: High  Palliative Medicine Team providers are available by phone from 7am to 7pm daily and can be reached through the team cell phone.  Should this patient require assistance outside of these hours, please call the patient's attending physician.

## 2024-10-16 NOTE — Plan of Care (Signed)
  Problem: Health Behavior/Discharge Planning: Goal: Ability to manage health-related needs will improve Outcome: Progressing   Problem: Clinical Measurements: Goal: Will remain free from infection Outcome: Progressing   Problem: Activity: Goal: Risk for activity intolerance will decrease Outcome: Progressing   Problem: Nutrition: Goal: Adequate nutrition will be maintained Outcome: Progressing   Problem: Coping: Goal: Level of anxiety will decrease Outcome: Progressing   Problem: Elimination: Goal: Will not experience complications related to urinary retention Outcome: Progressing   Problem: Pain Managment: Goal: General experience of comfort will improve and/or be controlled Outcome: Progressing   Problem: Safety: Goal: Ability to remain free from injury will improve Outcome: Progressing   Problem: Skin Integrity: Goal: Risk for impaired skin integrity will decrease Outcome: Progressing

## 2024-10-16 NOTE — Telephone Encounter (Addendum)
 Patient Product/process Development Scientist completed.    The patient is insured through [employer]/ [employer]. Patient has Medicare and is not eligible for a copay card, but may be able to apply for patient assistance or Medicare RX Payment Plan (Patient Must reach out to their plan, if eligible for payment plan), if available.    Ran test claim for enoxaparin (Lovenox) 80 mg/0.8 ml and the current 30 day co-pay is $320.98.  Ran test claim for Fragmin 7500 unit/0.3 ml and the current 30 day co-pay is $1,397.37   This test claim was processed through [facility]- copay amounts may vary at other pharmacies due to pharmacy/plan contracts, or as the patient moves through the different stages of their insurance plan.     Reyes Sharps, CPHT Pharmacy Technician Patient Advocate Specialist Lead [facility] Patient Advocate Team Direct Number: [phone]  Fax: [phone]

## 2024-10-16 NOTE — Plan of Care (Signed)

## 2024-10-16 NOTE — TOC Initial Note (Signed)
 Transition of Care [facility]) - Initial/Assessment Note    Patient Details  Name: [name] [name] MRN: [id number] Date of Birth: 21-Sep-1946  Transition of Care [facility]) CM/SW Contact:    [name], LCSW Phone Number: 10/16/2024, 2:20 PM  Clinical Narrative:     CSW updated by palliative NP of patient's family interest in [facility], preference for residential but would like to speak tomorrow when all children are at bedside. Referral sent to [facility], they plan to meet with family tomorrow morning. CSW to follow.              Expected Discharge Plan: Hospice Medical Facility Barriers to Discharge: Continued Medical Work up   Patient Goals and CMS Choice Patient states their goals for this hospitalization and ongoing recovery are:: patient unable to participate in goal setting, not fully oriented CMS Medicare.gov Compare Post Acute Care list provided to:: Patient Represenative (must comment) Choice offered to / list presented to : Adult Children      Expected Discharge Plan and Services     Post Acute Care Choice: Hospice Living arrangements for the past 2 months: Single Family Home                                      Prior Living Arrangements/Services Living arrangements for the past 2 months: Single Family Home Lives with:: Self Patient language and need for interpreter reviewed:: No Do you feel safe going back to the place where you live?: Yes      Need for Family Participation in Patient Care: Yes (Comment) Care giver support system in place?: Yes (comment)   Criminal Activity/Legal Involvement Pertinent to Current Situation/Hospitalization: No - Comment as needed  Activities of Daily Living   ADL Screening (condition at time of admission) Does the patient have difficulty concentrating, remembering, or making decisions?: Yes  Permission Sought/Granted Permission sought to share information with : Facility Medical Sales Representative,  Family Supports Permission granted to share information with : Yes, Verbal Permission Granted  Share Information with NAME: [name]  Permission granted to share info w AGENCY: [facility]  Permission granted to share info w Relationship: Daughter     Emotional Assessment   Attitude/Demeanor/Rapport: Unable to Assess Affect (typically observed): Unable to Assess Orientation: : Oriented to Self Alcohol / Substance Use: Not Applicable Psych Involvement: No (comment)  Admission diagnosis:  Intracerebral hemorrhage (HCC) [I61.9] Pancreatic mass [K86.89] Generalized weakness [R53.1] Altered mental status, unspecified altered mental status type [R41.82] Patient Active Problem List   Diagnosis Date Noted   Pancreatic cancer metastasized to liver (HCC) 10/15/2024   Acute CVA (cerebrovascular accident) (HCC) 10/15/2024   Acute encephalopathy 10/14/2024   Renal infarct 10/14/2024   Splenic infarct 10/14/2024   History of CVA (cerebrovascular accident) 10/08/2024   Contact dermatitis 09/17/2022   Paroxysmal atrial fibrillation (HCC) 12/13/2020   Secondary hypercoagulable state 12/13/2020   Diverticulosis of large intestine 06/10/2016   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   Ductal carcinoma in situ (DCIS) of right breast 07/28/2011   History of breast cancer 07/28/2011   Shortness of breath 06/25/2011   HLD (hyperlipidemia) 03/28/2010   PALPITATIONS 03/27/2010   PCP:  Ozell Heron [name], MD Pharmacy:   [id number] [address] [place], [place] - [address] [place] [address] Phone: [phone] Fax: [phone]  [place] [facility] [address],  [address] [place] [address] Phone: [phone] Fax: [phone]     Social Drivers of Health (SDOH) Social History: SDOH Screenings   Food Insecurity: Unknown (10/11/2024)  Housing: Unknown (10/11/2024)  Transportation Needs: No Transportation Needs (10/11/2024)   Utilities: Not At Risk (10/11/2024)  Alcohol Screen: Low Risk  (04/14/2024)  Depression (PHQ2-9): Low Risk  (10/11/2024)  Financial Resource Strain: Low Risk  (04/14/2024)  Physical Activity: Insufficiently Active (04/14/2024)  Social Connections: Moderately Integrated (10/08/2024)  Stress: No Stress Concern Present (04/14/2024)  Tobacco Use: Low Risk  (10/14/2024)  Health Literacy: Adequate Health Literacy (04/14/2024)   SDOH Interventions:     Readmission Risk Interventions     No data to display

## 2024-10-16 NOTE — Progress Notes (Signed)
 PHARMACY - ANTICOAGULATION CONSULT NOTE  Pharmacy Consult for IV heparin  Indication: atrial fibrillation  Allergies  Allergen Reactions   Naproxen Sodium Hives   Neomycin-Bacitracin Zn-Polymyx Swelling    In ear, where it was applied.    Tetracycline Rash    All over the body   Apixaban Hives   Crestor [Rosuvastatin] Other (See Comments)    Joint pain   Lipitor  [Atorvastatin ] Other (See Comments)    Joint pain and weakness.    Advil [Ibuprofen] Rash   Other Rash    ALL TAPES-RASH (PT HAS LEAST RXN TO PAPER TAPE)    Patient Measurements: Height: 5' 4 (162.6 cm) Weight: 73.9 kg (163 lb) IBW/kg (Calculated) : 54.7 HEPARIN  DW (KG): 70  Vital Signs: Temp: 98.1 F (36.7 C) (11/24 1630) Temp Source: Oral (11/24 1630) BP: 133/68 (11/24 1630) Pulse Rate: 88 (11/24 1630)  Labs: Recent Labs    10/14/24 1353 10/14/24 1407 10/14/24 1602 10/14/24 2110 10/15/24 0105 10/15/24 2114 10/16/24 0645 10/16/24 1616  HGB 14.3 14.6  --   --  12.8  --   --   --   HCT 42.0 43.0  --   --  37.8  --   --   --   PLT 125*  --   --   --  110*  --   --   --   APTT  --   --   --   --   --  30 38* 48*  LABPROT 25.0*  --   --   --   --   --   --   --   INR 2.1*  --   --   --   --   --   --   --   HEPARINUNFRC  --   --   --   --   --  0.67 0.47  --   CREATININE 0.80 0.80  --   --  0.86  --   --   --   UMNENWPWPYD 8,202*  --  2,690* 2,529* 2,187*  --   --   --     Estimated Creatinine Clearance: 52.3 mL/min (by C-G formula based on SCr of 0.86 mg/dL).   Medical History: Past Medical History:  Diagnosis Date   Anxiety    Arthritis    Atrial fibrillation (HCC)    Benign colon polyp    Cancer (HCC) 2012   breast- left   Complication of anesthesia    Diverticulosis    Dysrhythmia    History of breast cancer    s/p lumpectomy and HRT   Hyperlipidemia    intolerant of all cholesterol meds   Obesity    Palpitations    Pancreatic cancer metastasized to liver (HCC) 10/15/2024   PONV  (postoperative nausea and vomiting)    Rheumatoid factor positive 02/09/2020    Medications:  Infusions:   heparin  900 Units/hr (10/16/24 1611)   Assessment: 78 yo female with metastatic cancer, on Xarelto  PTA, last dose 11/22 at 11am.  Also admitted with ICH, after discussions with cards, neuro and oncology MD, pharmacy asked to start IV heparin  with low dose/stroke dosing.  aPTT 48sec is subtherapeutic on heparin  drip rate 900 units/hr.  No issues with infusion or bleeding per RN. Heparin  level is not correlating.   Goal of Therapy:  Heparin  level 0.3-0.5 aPTT 66-85 Monitor platelets by anticoagulation protocol: Yes   Plan:  Increase IV heparin  to 1000 units/hr, no bolus Monitor daily aPTT, heparin  level,  CBC, signs/symptoms of bleeding  Planning for lovenox at [employer] Pharm.D. CPP, BCPS Clinical Pharmacist [phone] 10/16/2024 5:14 PM    Please check AMION for all [facility] Pharmacy phone numbers After 10:00 PM, call Main Pharmacy [phone]

## 2024-10-17 DIAGNOSIS — Z66 Do not resuscitate: Secondary | ICD-10-CM | POA: Diagnosis not present

## 2024-10-17 DIAGNOSIS — M545 Low back pain, unspecified: Secondary | ICD-10-CM

## 2024-10-17 DIAGNOSIS — Z515 Encounter for palliative care: Secondary | ICD-10-CM | POA: Diagnosis not present

## 2024-10-17 DIAGNOSIS — G8929 Other chronic pain: Secondary | ICD-10-CM

## 2024-10-17 DIAGNOSIS — Z7189 Other specified counseling: Secondary | ICD-10-CM | POA: Diagnosis not present

## 2024-10-17 DIAGNOSIS — Z711 Person with feared health complaint in whom no diagnosis is made: Secondary | ICD-10-CM | POA: Diagnosis not present

## 2024-10-17 DIAGNOSIS — I639 Cerebral infarction, unspecified: Secondary | ICD-10-CM | POA: Diagnosis not present

## 2024-10-17 LAB — HEPARIN LEVEL (UNFRACTIONATED): Heparin Unfractionated: 0.6 [IU]/mL (ref 0.30–0.70)

## 2024-10-17 LAB — APTT: aPTT: 61 s — ABNORMAL HIGH (ref 24–36)

## 2024-10-17 LAB — CANCER ANTIGEN 19-9: CA 19-9: 3237 U/mL — ABNORMAL HIGH (ref 0–35)

## 2024-10-17 LAB — GLUCOSE, CAPILLARY: Glucose-Capillary: 124 mg/dL — ABNORMAL HIGH (ref 70–99)

## 2024-10-17 MED ORDER — ENSURE PLUS HIGH PROTEIN PO LIQD
237.0000 mL | Freq: Two times a day (BID) | ORAL | Status: DC
  Administered 2024-10-18 (×2): 237 mL via ORAL

## 2024-10-17 MED ORDER — HYDROCODONE-ACETAMINOPHEN 5-325 MG PO TABS
1.0000 | ORAL_TABLET | ORAL | Status: DC | PRN
  Administered 2024-10-17 – 2024-10-19 (×6): 1 via ORAL
  Filled 2024-10-17 (×5): qty 1

## 2024-10-17 NOTE — Progress Notes (Signed)
   I met with pt and family this am at bedside to discuss hospice options at home with pt or at our Hospice facility. [facility] in [facility]). Meeting was effective and family now taking time as family to discuss among themselves. I am waiting a return call on the decision they feel is best for the family and pt.   Magdalena Berber RN [phone]

## 2024-10-17 NOTE — Progress Notes (Signed)
 Respectfully, due to patient being in hospice care Important Message Letter will be mailed to patient.

## 2024-10-17 NOTE — Progress Notes (Addendum)
 Palliative Medicine Inpatient Follow Up Note HPI: Recent embolic stroke. Now presenting with confusion and found to have small intracerebral hemorrhage. 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 Troponin also significantly elevated with limited options if ongoing intracerebral hemorrhage. MRI brain ordered to further evaluate bleeding.  Not on options for infarcts if ongoing bleeding.  Also with new pancreatic mass suspicious for neoplasm  long-term prognosis is poor.  Family has agreed to DNR/DNI.  I notified them that I would be consulting palliative for ongoing discussions about goals of care while continuing workup given some of her treatment options will likely be limited.   Today's Discussion 10/17/2024  *Please note that this is a verbal dictation therefore any spelling or grammatical errors are due to the Dragon Medical One system interpretation.  I reviewed the chart notes including nursing notes from today, progress notes from today. I also reviewed vital signs, nursing flowsheets, medication administrations record, labs, and imaging.    A family meeting was held this morning with patients three children, Magdalena Berber of [facility] and myself.   Cheri explained the differences between hospice care levels, both inpatient levels as well as home care. She shares that overtime patient will become weaker and it will be needed, if Elna goes home to have 24/7 care arranged. If family elects inpatient hospice this would be covered by medicare at the inpatient level, however if patient stabilizes her level of care may be downgraded and the family could incur additional cost. She shares the importance of patients family determining what is best for them.   We reviewed comfort measures and what these would entail. We talked about transition to comfort measures in house and what that would entail inclusive of medications to control  pain, dyspnea, agitation, nausea, itching, and hiccups.  We discussed stopping all uneccessary measures such as cardiac monitoring, blood draws, needle sticks, and frequent vital signs.   We discussed the risk benefit of continuing anticoagulants at this time.   Utilized reflective listening throughout our time together.   Patients family were thankful for the information Cheri provided. They request time to talk among themselves.  Questions and concerns addressed/Palliative Support Provided.  ___________________________________ Addendum:  I spoke to patients daughter, [name] at this time she would like to allow the next two days with her brothers to spend with Clarita and on Friday to , [name] herself is leaning towards inpatient hospice.   While on the phone [place] shared that a code was called on [place].  I went to bedside and shared she is a DNAR/DNI to not code her.  We reviewed while she lost consciousness she had been on the toilet.  Her VSS were stable, BS WNL. She became more aware once gotten into bed with her legs elevated.   I spoke to patients two sons thereafter.  Add time: 37  Objective Assessment: Vital Signs Vitals:   10/17/24 0259 10/17/24 0730  BP: (!) 151/87 (!) 153/81  Pulse: 88 72  Resp: 18 18  Temp: 98.2 F (36.8 C) 97.8 F (36.6 C)  SpO2: 98% 97%    Intake/Output Summary (Last 24 hours) at 10/17/2024 0947 Last data filed at 10/16/2024 1611 Gross per 24 hour  Intake 190.71 ml  Output --  Net 190.71 ml   Last Weight  Most recent update: 10/14/2024  1:43 PM    Weight  73.9 kg (163 lb)  Gen: Elderly Caucasian female HEENT: Dry mucous membranes CV: Regular rate and irregular rhythm  PULM: On room air breathing is even and nonlabored ABD: soft/nontender  EXT: Bilateral lower extremity edema +1 pitting Neuro: Somnolent but arousable   SUMMARY OF RECOMMENDATIONS   DNAR/DNI   Appreciate Dr. Timmy meeting with patients  family this morning  Appreciate [facility], [employer] meeting with family  At this time family are speaking among themselves to determine the best path moving forward   Ongoing palliative care support   Code Status/Advance Care Planning: DNAR/DNI   Symptom Management:  Back pain: - Continue Tylenol  as needed, Norco as needed, morphine  as needed   Delirium: - Maintenance of strict delirium precautions ______________________________________________________________________________________ [facility] Palliative Medicine Team Team Cell Phone: [phone] Please utilize secure chat with additional questions, if there is no response within 30 minutes please call the above phone number  Billing based on MDM: High  Palliative Medicine Team providers are available by phone from 7am to 7pm daily and can be reached through the team cell phone.  Should this patient require assistance outside of these hours, please call the patient's attending physician.

## 2024-10-17 NOTE — Care Management Important Message (Signed)
 Important Message  Patient Details  Name: [name] [name] MRN: [id number] Date of Birth: 1946-01-24   Important Message Given:  No     Jennie Laneta Dragon 10/17/2024, 4:27 PM

## 2024-10-17 NOTE — Progress Notes (Signed)
 Patient experienced unanticipated Vagal response on BSC. Notified by tele of cardiac changes during episode. Patient assisted to bed by responding staff and placed in slight trendelenburg position. Patient became arousable and fatigued. Has since returned to baseline, VS obtained as per flow sheet. Patient positioning per comfort with safety measures in place. NP at bedside and family supported by staff.

## 2024-10-17 NOTE — Progress Notes (Signed)
 PHARMACY - ANTICOAGULATION CONSULT NOTE  Pharmacy Consult for IV heparin  Indication: atrial fibrillation  Allergies  Allergen Reactions   Naproxen Sodium Hives   Neomycin-Bacitracin Zn-Polymyx Swelling    In ear, where it was applied.    Tetracycline Rash    All over the body   Apixaban Hives   Crestor [Rosuvastatin] Other (See Comments)    Joint pain   Lipitor  [Atorvastatin ] Other (See Comments)    Joint pain and weakness.    Advil [Ibuprofen] Rash   Other Rash    ALL TAPES-RASH (PT HAS LEAST RXN TO PAPER TAPE)    Patient Measurements: Height: 5' 4 (162.6 cm) Weight: 73.9 kg (163 lb) IBW/kg (Calculated) : 54.7 HEPARIN  DW (KG): 70  Vital Signs: Temp: 97.8 F (36.6 C) (11/25 0730) Temp Source: Oral (11/25 0730) BP: 153/81 (11/25 0730) Pulse Rate: 72 (11/25 0730)  Labs: Recent Labs    10/14/24 1353 10/14/24 1407 10/14/24 1602 10/14/24 2110 10/15/24 0105 10/15/24 2114 10/15/24 2114 10/16/24 0645 10/16/24 1616 10/17/24 0145  HGB 14.3 14.6  --   --  12.8  --   --   --   --   --   HCT 42.0 43.0  --   --  37.8  --   --   --   --   --   PLT 125*  --   --   --  110*  --   --   --   --   --   APTT  --   --   --   --   --  30   < > 38* 48* 61*  LABPROT 25.0*  --   --   --   --   --   --   --   --   --   INR 2.1*  --   --   --   --   --   --   --   --   --   HEPARINUNFRC  --   --   --   --   --  0.67  --  0.47  --  0.60  CREATININE 0.80 0.80  --   --  0.86  --   --   --   --   --   UMNENWPWPYD 8,202*  --  2,690* 2,529* 2,187*  --   --   --   --   --    < > = values in this interval not displayed.    Estimated Creatinine Clearance: 52.3 mL/min (by C-G formula based on SCr of 0.86 mg/dL).   Medical History: Past Medical History:  Diagnosis Date   Anxiety    Arthritis    Atrial fibrillation (HCC)    Benign colon polyp    Cancer (HCC) 2012   breast- left   Complication of anesthesia    Diverticulosis    Dysrhythmia    History of breast cancer    s/p  lumpectomy and HRT   Hyperlipidemia    intolerant of all cholesterol meds   Obesity    Palpitations    Pancreatic cancer metastasized to liver (HCC) 10/15/2024   PONV (postoperative nausea and vomiting)    Rheumatoid factor positive 02/09/2020    Medications:  Infusions:   heparin  1,000 Units/hr (10/16/24 2230)   Assessment: 78 yo female with metastatic cancer, on Xarelto  PTA, last dose 11/22 at 11am.  Also admitted with ICH, after discussions with cards, neuro and oncology MD, pharmacy asked to  start IV heparin  with low dose/stroke dosing.  aPTT 66 is subtherapeutic on 1000 units/hr.  Heparin  level is not correlating.   Goal of Therapy:  Heparin  level 0.3-0.5 aPTT 66-85 Monitor platelets by anticoagulation protocol: Yes   Plan:  Increase IV heparin  to 1100 units/hr, no bolus Monitor daily aPTT, heparin  level, CBC, signs/symptoms of bleeding  Planning for switch to lovenox    Jinnie Door, PharmD, BCPS, BCCP Clinical Pharmacist  Please check AMION for all [facility] Pharmacy phone numbers After 10:00 PM, call Main Pharmacy [phone]

## 2024-10-17 NOTE — TOC Progression Note (Signed)
 Transition of Care [facility]) - Progression Note    Patient Details  Name: [name] [name] MRN: [id number] Date of Birth: 30-Sep-1946  Transition [facility]) CM/SW Contact  [name], [place] Phone Number: 10/17/2024, 3:05 PM  Clinical Narrative:   CSW updated by hospice liaison that family is deciding on options, will update when a decision is made. CSW to follow.  UPDATE: CSW notified by palliative NP that patient's family wants to wait on decision until Friday. CSW to follow.    Expected Discharge Plan: Hospice Medical Facility Barriers to Discharge: Continued Medical Work up               Expected Discharge Plan and Services     Post Acute Care Choice: Hospice Living arrangements for the past 2 months: Single Family Home                                       Social Drivers of Health (SDOH) Interventions SDOH Screenings   Food Insecurity: Unknown (10/11/2024)  Housing: Unknown (10/11/2024)  Transportation Needs: No Transportation Needs (10/11/2024)  Utilities: Not At Risk (10/11/2024)  Alcohol Screen: Low Risk  (04/14/2024)  Depression (PHQ2-9): Low Risk  (10/11/2024)  Financial Resource Strain: Low Risk  (04/14/2024)  Physical Activity: Insufficiently Active (04/14/2024)  Social Connections: Moderately Integrated (10/08/2024)  Stress: No Stress Concern Present (04/14/2024)  Tobacco Use: Low Risk  (10/14/2024)  Health Literacy: Adequate Health Literacy (04/14/2024)    Readmission Risk Interventions     No data to display

## 2024-10-17 NOTE — Progress Notes (Signed)
 Chaplain responded to code blue. Code blue canceled before chaplain arrived.  Alan HERO. Davee Lomax, M.Div. [facility] Chaplain Pager [phone] Office [phone]

## 2024-10-17 NOTE — Progress Notes (Signed)
 [facility]  PROGRESS NOTE  [name] [name] FMW:[id number] DOB: 01/16/1946 DOA: 10/14/2024 PCP: Ozell Heron [name], MD   Brief HPI:   78 y.o. female with medical history significant of hyperlipidemia, CVA, breast cancer presenting with worsening mentation.  Patient was admitted 11/16-11/19 with right-sided deficits and found to have CVA suspicious for embolic etiology.  Patient was on Xarelto  and on discharge was counseled on taking it the same way every day with food.     Patient discharged 5 days ago and for the past 2 days family has noticed decreased activity, decreased p.o. intake, weakness, confusion. In the ED CT of the head showed new focal petechial hemorrhage, CT abdomen/pelvis showed 1.5 x 2 cm hypoechoic mass of the pancreas consistent with neoplasm also showed multiple liver lesions consistent with metastasis. Also found to have NSTEMI with troponin elevated to 1797 Cardiology, neurology were consulted   Assessment/Plan:   Pancreatic mass/likely pancreatic cancer - CT abdomen/pelvis showed 1.5 x 2 cm mass of pancreas on CT, consistent with neoplasm -Also noted to have multiple liver lesions -Recent infarcts including splenic infarct, renal infarct and thrombophlebitis while being on Xarelto  unlikely due to underlying neoplasm - Palliative care consulted, patient is currently DNR - Discussed with patient's daughter regarding further management, will consult oncology for further recommendations, patient is a poor candidate for chemotherapy; will be an ideal candidate for hospice - Oncology consulted, recommend hospice.  Not a candidate for chemotherapy. - Family has decided to go with hospice. - [facility] consulted, and discussion with family regarding disposition options  Acute encephalopathy/intracerebral hemorrhage - Presented with confusion, weakness, decreased p.o. intake - CT head was consistent with prior infection but also showed new focal petechial  hemorrhage - Neurology was consulted - MRI brain showed innumerable foci of acute/early subacute ischemia with a greatly decreased number of lesions compared to prior study, no acute hemorrhage  NSTEMI - Likely in setting of demand ischemia - Troponin was elevated to 2000, ECG showed mild inferolateral T wave inversion, likely demand ischemi - Cardiology was consulted, no further intervention recommended  Recent CVA/splenic infarct/renal infarct/recent DVT - She has a history of atrial fibrillation -Also diagnosed with pancreatic cancer; with likely hypercoagulable state - Cardiology recommends her to be on long-term anticoagulation - IV heparin  per pharmacy  History of recent CVA - Continue simvastatin  - Xarelto  is currently on hold - Heparin  per pharmacy  History of breast cancer - Noted  Goals of care - Patient has poor prognosis considering metastatic pancreatic cancer with multiple comorbid conditions.  Will not be a good candidate for chemotherapy.  Family decided for hospice.  [facility] in contact with patient's family regarding disposition options.      DVT prophylaxis: Heparin   Medications     lidocaine   1 patch Transdermal Q24H   simvastatin   40 mg Oral QHS   sodium chloride  flush  3 mL Intravenous Q12H     Data Reviewed:   CBG:  Recent Labs  Lab 10/14/24 1337  GLUCAP 128*    SpO2: 99 %    Vitals:   10/16/24 2347 10/17/24 0259 10/17/24 0730 10/17/24 1121  BP: (!) 151/77 (!) 151/87 (!) 153/81 (!) 156/76  Pulse: 82 88 72 78  Resp: 17 18 18 18   Temp: 97.9 F (36.6 C) 98.2 F (36.8 C) 97.8 F (36.6 C) 97.7 F (36.5 C)  TempSrc: Oral Oral Oral Oral  SpO2: 95% 98% 97% 99%  Weight:      Height:  Data Reviewed:  Basic Metabolic Panel: Recent Labs  Lab 10/14/24 1353 10/14/24 1407 10/15/24 0105  NA 139 140 138  K 3.7 3.7 3.6  CL 104 103 103  CO2 20*  --  27  GLUCOSE 110* 119* 108*  BUN 10 11 9   CREATININE 0.80 0.80  0.86  CALCIUM  9.3  --  9.0    CBC: Recent Labs  Lab 10/14/24 1353 10/14/24 1407 10/15/24 0105  WBC 9.8  --  10.3  NEUTROABS 7.4  --   --   HGB 14.3 14.6 12.8  HCT 42.0 43.0 37.8  MCV 89.4  --  90.2  PLT 125*  --  110*    LFT Recent Labs  Lab 10/14/24 1353 10/15/24 0105  AST 48* 42*  ALT 47* 38  ALKPHOS 151* 131*  BILITOT 1.2 1.4*  PROT 6.9 6.4*  ALBUMIN 3.5 3.3*     Antibiotics: Anti-infectives (From admission, onward)    None        CONSULTS cardiology, neurology  Code Status: DNR  Family Communication: Discussed with patient daughter and her husband at bedside     Subjective   Denies any complaints.  Hospice meeting scheduled for today   Objective    Physical Examination:  Appears in no acute distress S1-S2, regular Lungs clear to auscultation bilaterally Abdomen is soft, nontender           [name] S [name]   [facility] If 7PM-7AM, please contact night-coverage at [email or web address], Office  [phone]   10/17/2024, 3:34 PM  LOS: 3 days

## 2024-10-17 NOTE — Progress Notes (Signed)
 I had a long talk with the family this morning.  There were all very nice.  They have a very good understanding as to what is going on with [name] [name].  Again, I told them that she had stage IV pancreatic cancer.  She is just in no shape to be able to tolerate the kind of chemotherapy that is effective.  Even in the best of situations, chemotherapy works in less than 20% of patients.  I think toxicity would be substantial.  I am told them that I really would like to have comfort, respect, and dignity for her.  She deserves this.  She was a engineer, civil (consulting) for many years.  She served others.  I do think that Hospice is the way to go.  For right now, she is just does not a candidate for residential hospice.  I think if things get more complicated at home, then she would definitely be a candidate for residential Hospice.  I told them that I thought that she would just get weaker.  I told them that I just do not think she would eat much.  She would lose weight.  She was sleep more.  I think the 1 concern that the family has where these mini strokes that she has had.  She clearly is at risk for additional many strokes because of her underlying malignancy.  A lot of times, oral anticoagulants just are not that effective in preventing these strokes.  We often have to use heparin  or Lovenox.  I am not sure if Hospice would agree to this.  I told him that I thought that she had a 50/50 chance of making it through December.  I really do not think that she would be able to make it through January.  Again, the family is just worried about trying to help her at home.  They are incredibly supportive.  I do not think she will have any problems with pain.  Again I just think that her overall status will just decrease.  For right now, I think she is probably on IV heparin .  I wonder if we would be able to switch over to subcu heparin .  I think this would be reasonable.  I probably would try to do the heparin  twice a day if  possible.  I think this would be effective and amenable to Hospice.  The family is very nice.  I answered all their questions.  I know that they want the best for [name] [name].  They want to be able to help her in any way possible.  I just do not want to place undue stress on them trying to help her.  Again, if she has difficulties at home, she would clearly be a candidate for residential Hospice.  I think that Palliative Care will talk to them later this morning.   Jeralyn Crease, MD  2 Timothy 4:6-8

## 2024-10-18 ENCOUNTER — Other Ambulatory Visit (HOSPITAL_COMMUNITY): Payer: Self-pay

## 2024-10-18 ENCOUNTER — Telehealth (HOSPITAL_COMMUNITY): Payer: Self-pay

## 2024-10-18 DIAGNOSIS — I639 Cerebral infarction, unspecified: Secondary | ICD-10-CM | POA: Diagnosis not present

## 2024-10-18 LAB — APTT: aPTT: 61 s — ABNORMAL HIGH (ref 24–36)

## 2024-10-18 LAB — HEPARIN LEVEL (UNFRACTIONATED): Heparin Unfractionated: 0.56 [IU]/mL (ref 0.30–0.70)

## 2024-10-18 NOTE — Telephone Encounter (Signed)
 Pharmacy Patient Advocate Encounter  Insurance verification completed.    The patient is insured through [employer]/ [employer]. Patient has Medicare and is not eligible for a copay card, but may be able to apply for patient assistance or Medicare RX Payment Plan (Patient Must reach out to their plan, if eligible for payment plan), if available.    Ran test claim for heparin  5000 UNIT/ML injection and the current 30 day co-pay is $47.   This test claim was processed through [facility]- copay amounts may vary at other pharmacies due to [employer], or as the patient moves through the different stages of their insurance plan.

## 2024-10-18 NOTE — Progress Notes (Signed)
 Overall, much of the road has been much change with Ms. Remsen.  She is not eating much.  She does not like the morphine .  We will just stop this and just have her use Vicodin.  I think this is reasonable.  She is on IV heparin .  I need to have her on subcutaneous heparin  as an outpatient.  I am sure the pharmacy will be able to help us  out.  She says the bed is not comfortable.  She is having a lot of back discomfort.  This is in the lower back.  As such I do not think this is anything related to her underlying malignancy.  She has had no fever.  She has had no cough.  She is not going to the bathroom all that much.  Her vital signs show temperature 98.7.  Pulse 78.  Blood pressure 147/87.  Her head and neck exam shows no ocular or oral lesions.  She has no palpable cervical or supraclavicular lymph nodes.  Lungs are clear bilaterally.  Cardiac exam regular rate and rhythm.  She has an occasional extra beat.  I hear no murmurs.  Abdomen is soft.  Bowel sounds are present.  She has no fluid wave.  There is no guarding or rebound tenderness.  Extremities shows no clubbing, cyanosis or edema.  Neurological exam shows no focal neurological deficits.  Again, I think the plan of attack is to see about trying to get her home or I do not know if she can go to a Hospice facility.  I know that the Case Manager is working on this.  I know the family is deciding what might be best.  Again I just want to try to make things as easy as possible.  I do think that subcutaneous heparin  would be a reasonable option for her as an outpatient to try to help prevent more embolic strokes.  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  I know that she is getting wonderful care from everybody up on [address].  I really do appreciate everybody's help.   Jeralyn Crease, MD  Psalm 68:19

## 2024-10-18 NOTE — Progress Notes (Signed)
 Pt daughter, [name], stated pt wishes to transition to comfort care and placement in residential hospice.  Requesting to speak with MD and SW in am.

## 2024-10-18 NOTE — Plan of Care (Signed)

## 2024-10-18 NOTE — Plan of Care (Signed)

## 2024-10-18 NOTE — Progress Notes (Signed)
 PROGRESS NOTE  [name] [name] FMW:[id number] DOB: 1946/05/08 DOA: 10/14/2024 PCP: Ozell Heron [name], MD   LOS: 4 days   Brief narrative:  [name] [name] is a 78 y.o. female with past medical history  of essential hypertension, atrial fibrillation and DVT on Xarelto  , hyperlipidemia intolerant of statins presented to the  hospital with altered mental status.  Of note patient was recently admitted to hospital 1 week prior to this presentation for left-sided weakness, heaviness of the left extremities with flashes in her eyes and was seen by neurology, who was diagnosed of embolic CVA and was discharged home in stable condition after discharge patient had decreased activity with decreased oral intake generalized weakness and confusion and was brought into the hospital. In the ED, blood pressure was slightly elevated.  Labs with notable for INR of 2.1.  Troponin elevated to 1797 initially.  COVID influenza and RSV was negative.  Ammonia level was within normal limits.  Urinalysis showed trace leukocytes only.  Chest x-ray CTA of the chest did not show any acute abnormality.  CT head showed recent infarction but focal petechial hemorrhage.  CT scan of the abdomen and pelvis showed 1.5 x 2 cm hypoenhancing mass of the pancreas consistent with neoplasm and multiple liver lesions.. Patient was then considered for admission to the hospital for further evaluation and treatment.      Assessment/Plan: Principal Problem:   Acute CVA (cerebrovascular accident) (HCC) Active Problems:   HLD (hyperlipidemia)   Paroxysmal atrial fibrillation (HCC)   Acute encephalopathy   Renal infarct   Splenic infarct   Pancreatic cancer metastasized to liver [facility])  Altered mental status likely metabolic encephalopathy. acute and subacute embolic CVA: Concern for intracerebral hemorrhage.  Previous MRI of the brain showed extensive bilateral cerebral CVA.  Review of recent LDL of 183.  Hemoglobin A1c of 5.4.  Review of recent  MRI angiogram of the head did not show any significant intracranial stenosis.  Anticoagulation on hold.  Repeat MRI brain this admission showed innumerable foci of early to subacute ischemia  Hyperlipidemia: Recent LDL of 183.  Continue Lipitor  80 mg milligram daily  Elevated troponin likely non-ST elevation MI type II.  Troponin peak up to 2000.  Cardiology was consulted.  EKG showed inferolateral T wave inversion.  Likely demand ischemia.  Cardiology with an impression that she is unlikely to be a good candidate for aggressive cardiac intervention.  New pancreatic mass with concerns for metastatic disease. Palliative care on board for goals of care.  Seen by oncology and at this time oncology with an impression that chemotherapy might not improve her quality of life.  Paroxysmal atrial fibrillation with splenic infarcts:  CHA2DS2/VAS    5 >= 2 Points: High Risk.  Was on Xarelto  at home.  Cardiology was consulted and patient is not a good candidate for anticoagulation due to coexistent concerns for petechial hemorrhage in the brain.  MRI of the brain did not show any evidence of hemorrhage in the brain.  Was initially on IV heparin .  Palliative care also following.  Will need to determine whether patient would benefit from anticoagulation on discharge depending upon goals of care.  Hyperlipidemia on Zocor   Nutrition.  On feeding supplements.  Goals of care.  Palliative care has seen the patient at this time.  Patient is DNR/DNI.  Patient with metastatic pancreatic cancer with multiple comorbidities.  Family has decided for hospice at this time.  TOC has been consulted.  DVT prophylaxis: SCDs Start: 10/14/24  1723   Disposition: Plan for hospice.  Family discussing about the best course of action at this time.  Status is: Inpatient Remains inpatient appropriate because: Acute stroke, pending clinical improvement, pending disposition plan    Code Status:     Code Status: Limited: Do not  attempt resuscitation (DNR) -DNR-LIMITED -Do Not Intubate/DNI   Family Communication: None at bedside  Consultants: Neurology  Procedures: None  Anti-infectives:  None  Anti-infectives (From admission, onward)    None        Subjective: Today, patient was seen and examined at bedside.  Appears weak and deconditioned and fatigued.  Feels tired.  Has some chronic back pain.  Denies nausea vomiting but has poor appetite.  Had a smear of bowel movement.  Denies any headache nausea vomiting.  Objective: Vitals:   10/18/24 0909 10/18/24 1131  BP: 121/62 133/82  Pulse: 75 84  Resp: 16 18  Temp: 97.9 F (36.6 C) 98.1 F (36.7 C)  SpO2: 98% 100%    Intake/Output Summary (Last 24 hours) at 10/18/2024 1136 Last data filed at 10/18/2024 1027 Gross per 24 hour  Intake 264.21 ml  Output 500 ml  Net -235.79 ml   Filed Weights   10/14/24 1343  Weight: 73.9 kg   Body mass index is 27.98 kg/m.   Physical Exam:  GENERAL: Patient is alert awake and Communicative, elderly female, appears weak and deconditioned, not in obvious distress. HENT: No scleral pallor or icterus. Pupils equally reactive to light. Oral mucosa is slightly dry. NECK: is supple, no gross swelling noted. CHEST: Clear to auscultation. No crackles or wheezes.   CVS: S1 and S2 heard, no murmur. Regular rate and rhythm.  ABDOMEN: Soft, non-tender, bowel sounds are present. EXTREMITIES: Trace lower extremity edema CNS: Cranial nerves are intact.  Generalized weakness noted SKIN: warm and dry without rashes.  Data Review: I have personally reviewed the following laboratory data and studies,  CBC: Recent Labs  Lab 10/14/24 1353 10/14/24 1407 10/15/24 0105  WBC 9.8  --  10.3  NEUTROABS 7.4  --   --   HGB 14.3 14.6 12.8  HCT 42.0 43.0 37.8  MCV 89.4  --  90.2  PLT 125*  --  110*   Basic Metabolic Panel: Recent Labs  Lab 10/14/24 1353 10/14/24 1407 10/15/24 0105  NA 139 140 138  K 3.7 3.7 3.6   CL 104 103 103  CO2 20*  --  27  GLUCOSE 110* 119* 108*  BUN 10 11 9   CREATININE 0.80 0.80 0.86  CALCIUM  9.3  --  9.0   Liver Function Tests: Recent Labs  Lab 10/14/24 1353 10/15/24 0105  AST 48* 42*  ALT 47* 38  ALKPHOS 151* 131*  BILITOT 1.2 1.4*  PROT 6.9 6.4*  ALBUMIN 3.5 3.3*   Recent Labs  Lab 10/14/24 1700  LIPASE 23   Recent Labs  Lab 10/14/24 1346  AMMONIA <13   Cardiac Enzymes: No results for input(s): CKTOTAL, CKMB, CKMBINDEX, TROPONINI in the last 168 hours. BNP (last 3 results) Recent Labs    10/15/24 0803  BNP 378.8*    ProBNP (last 3 results) No results for input(s): PROBNP in the last 8760 hours.  CBG: Recent Labs  Lab 10/14/24 1337 10/17/24 1544  GLUCAP 128* 124*   Recent Results (from the past 240 hours)  Blood Culture (routine x 2)     Status: None (Preliminary result)   Collection Time: 10/14/24  1:46 PM   Specimen: BLOOD  Result Value Ref Range Status   Specimen Description BLOOD RIGHT ANTECUBITAL  Final   Special Requests   Final    BOTTLES DRAWN AEROBIC AND ANAEROBIC Blood Culture results may not be optimal due to an inadequate volume of blood received in culture bottles   Culture   Final    NO GROWTH 4 DAYS Performed at [facility], [address] [place], [place] [address]    Report Status PENDING  Incomplete  Blood Culture (routine x 2)     Status: None (Preliminary result)   Collection Time: 10/14/24  1:51 PM   Specimen: BLOOD  Result Value Ref Range Status   Specimen Description BLOOD LEFT ANTECUBITAL  Final   Special Requests   Final    BOTTLES DRAWN AEROBIC AND ANAEROBIC Blood Culture adequate volume   Culture   Final    NO GROWTH 4 DAYS Performed at [facility], [address], [place], [place] [address]    Report Status PENDING  Incomplete  Resp panel by RT-PCR (RSV, Flu A&B, Covid) Anterior Nasal Swab     Status: None   Collection Time: 10/14/24  1:53 PM   Specimen: Anterior Nasal  Swab  Result Value Ref Range Status   SARS Coronavirus 2 by RT PCR NEGATIVE NEGATIVE Final   Influenza A by PCR NEGATIVE NEGATIVE Final   Influenza B by PCR NEGATIVE NEGATIVE Final    Comment: (NOTE) The Xpert Xpress SARS-CoV-2/FLU/RSV plus assay is intended as an aid in the diagnosis of influenza from Nasopharyngeal swab specimens and should not be used as a sole basis for treatment. Nasal washings and aspirates are unacceptable for Xpert Xpress SARS-CoV-2/FLU/RSV testing.  Fact Sheet for Patients: bloggercourse.com  Fact Sheet for Healthcare Providers: seriousbroker.it  This test is not yet approved or cleared by the [place]  FDA and has been authorized for detection and/or diagnosis of SARS-CoV-2 by FDA under an Emergency Use Authorization (EUA). This EUA will remain in effect (meaning this test can be used) for the duration of the COVID-19 declaration under Section 564(b)(1) of the Act, 21 U.S.C. section 360bbb-3(b)(1), unless the authorization is terminated or revoked.     Resp Syncytial Virus by PCR NEGATIVE NEGATIVE Final    Comment: (NOTE) Fact Sheet for Patients: bloggercourse.com  Fact Sheet for Healthcare Providers: seriousbroker.it  This test is not yet approved or cleared by the [place]  FDA and has been authorized for detection and/or diagnosis of SARS-CoV-2 by FDA under an Emergency Use Authorization (EUA). This EUA will remain in effect (meaning this test can be used) for the duration of the COVID-19 declaration under Section 564(b)(1) of the Act, 21 U.S.C. section 360bbb-3(b)(1), unless the authorization is terminated or revoked.  Performed at [facility], [address], [place], [place] [address]      Studies: No results found.     Vernal Alstrom, MD  [facility] 10/18/2024  If 7PM-7AM, please contact  night-coverage

## 2024-10-19 DIAGNOSIS — I639 Cerebral infarction, unspecified: Secondary | ICD-10-CM | POA: Diagnosis not present

## 2024-10-19 LAB — CULTURE, BLOOD (ROUTINE X 2)
Culture: NO GROWTH
Special Requests: ADEQUATE

## 2024-10-19 MED ORDER — LORAZEPAM 1 MG PO TABS
1.0000 mg | ORAL_TABLET | ORAL | Status: DC | PRN
  Administered 2024-10-19: 1 mg via ORAL
  Filled 2024-10-19: qty 1

## 2024-10-19 MED ORDER — ONDANSETRON HCL 4 MG/2ML IJ SOLN: 4.0000 mg | Freq: Four times a day (QID) | INTRAMUSCULAR | Status: DC | PRN

## 2024-10-19 MED ORDER — GLYCOPYRROLATE 1 MG PO TABS: 1.0000 mg | ORAL_TABLET | ORAL | Status: DC | PRN

## 2024-10-19 MED ORDER — BISACODYL 10 MG RE SUPP: 10.0000 mg | Freq: Every day | RECTAL | Status: DC | PRN

## 2024-10-19 MED ORDER — SODIUM CHLORIDE 0.9% FLUSH: 3.0000 mL | INTRAVENOUS | Status: DC | PRN

## 2024-10-19 MED ORDER — SODIUM CHLORIDE 0.9% FLUSH
3.0000 mL | Freq: Two times a day (BID) | INTRAVENOUS | Status: DC
  Administered 2024-10-19 – 2024-10-20 (×3): 3 mL via INTRAVENOUS

## 2024-10-19 MED ORDER — GLYCOPYRROLATE 0.2 MG/ML IJ SOLN: 0.2000 mg | INTRAMUSCULAR | Status: DC | PRN

## 2024-10-19 MED ORDER — GLYCOPYRROLATE 0.2 MG/ML IJ SOLN
0.2000 mg | INTRAMUSCULAR | Status: DC | PRN
Start: 2024-10-19 — End: 2024-10-20

## 2024-10-19 MED ORDER — LORAZEPAM 2 MG/ML IJ SOLN
1.0000 mg | INTRAMUSCULAR | Status: DC | PRN
Start: 2024-10-19 — End: 2024-10-20
  Administered 2024-10-19 – 2024-10-20 (×3): 1 mg via INTRAVENOUS
  Filled 2024-10-19 (×3): qty 1

## 2024-10-19 MED ORDER — LORAZEPAM 2 MG/ML PO CONC: 1.0000 mg | ORAL | Status: DC | PRN

## 2024-10-19 MED ORDER — FENTANYL CITRATE (PF) 50 MCG/ML IJ SOSY
25.0000 ug | PREFILLED_SYRINGE | INTRAMUSCULAR | Status: DC | PRN
  Administered 2024-10-19 – 2024-10-20 (×4): 25 ug via INTRAVENOUS
  Filled 2024-10-19 (×4): qty 1

## 2024-10-19 MED ORDER — OXYCODONE-ACETAMINOPHEN 5-325 MG PO TABS
1.0000 | ORAL_TABLET | Freq: Four times a day (QID) | ORAL | Status: DC | PRN
  Administered 2024-10-19: 2 via ORAL
  Filled 2024-10-19: qty 2

## 2024-10-19 MED ORDER — SODIUM CHLORIDE 0.9 % IV SOLN
250.0000 mL | INTRAVENOUS | Status: AC | PRN
Start: 2024-10-19 — End: 2024-10-20

## 2024-10-19 MED ORDER — MORPHINE SULFATE (PF) 2 MG/ML IV SOLN: 2.0000 mg | INTRAVENOUS | Status: DC | PRN

## 2024-10-19 MED ORDER — ONDANSETRON 4 MG PO TBDP: 4.0000 mg | ORAL_TABLET | Freq: Four times a day (QID) | ORAL | Status: DC | PRN

## 2024-10-19 NOTE — Progress Notes (Signed)
 PHARMACY - PHYSICIAN COMMUNICATION CRITICAL VALUE ALERT - BLOOD CULTURE IDENTIFICATION (BCID)  [name] [name] is an 78 y.o. female who presented to [facility] on 10/14/2024 with a chief complaint of CVA.   11/22 Bcx: 1/4 Bcx GPRs (ANA)- most likely a contaminant. No fever, no leukocytosis.   Assessment:    Name of physician (or Provider) Contacted: Vernal Alstrom MD   Current antibiotics: None   Changes to prescribed antibiotics recommended:  Patient is on recommended antibiotics - No changes needed- Patient has been transitioned to comfort care.   No results found for this or any previous visit.  [name] 10/19/2024  3:43 PM

## 2024-10-19 NOTE — Progress Notes (Signed)
 PROGRESS NOTE  STEPHANE JUNKINS FMW:[id number] DOB: 08-29-1946 DOA: 10/14/2024 PCP: Ozell Heron [name], MD   LOS: 5 days   Brief narrative:  [name] [name] is a 78 y.o. female with past medical history  of essential hypertension, atrial fibrillation and DVT on Xarelto  , hyperlipidemia intolerant of statins presented to the  hospital with altered mental status.  Of note patient was recently admitted to hospital 1 week prior to this presentation for left-sided weakness, heaviness of the left extremities with flashes in her eyes and was seen by neurology, who was diagnosed of embolic CVA and was discharged home in stable condition after discharge patient had decreased activity with decreased oral intake generalized weakness and confusion and was brought into the hospital. In the ED, blood pressure was slightly elevated.  Labs with notable for INR of 2.1.  Troponin elevated to 1797 initially.  COVID influenza and RSV was negative.  Ammonia level was within normal limits.  Urinalysis showed trace leukocytes only.  Chest x-ray CTA of the chest did not show any acute abnormality.  CT head showed recent infarction but focal petechial hemorrhage.  CT scan of the abdomen and pelvis showed 1.5 x 2 cm hypoenhancing mass of the pancreas consistent with neoplasm and multiple liver lesions.. Patient was then considered for admission to the hospital for further evaluation and treatment.      Assessment/Plan: Principal Problem:   Acute CVA (cerebrovascular accident) (HCC) Active Problems:   HLD (hyperlipidemia)   Paroxysmal atrial fibrillation (HCC)   Acute encephalopathy   Renal infarct   Splenic infarct   Pancreatic cancer metastasized to liver [facility])  Altered mental status likely metabolic encephalopathy. acute and subacute embolic CVA: Concern for intracerebral hemorrhage.  Previous MRI of the brain showed extensive bilateral cerebral CVA.  Review of recent LDL of 183.  Hemoglobin A1c of 5.4.  Review of recent  MRI angiogram of the head did not show any significant intracranial stenosis.  Anticoagulation on hold.  Repeat MRI brain this admission showed innumerable foci of early to subacute ischemia.  Family has opted for comfort care at this time.  Hyperlipidemia: Recent LDL of 183.  Transitioned to comfort care so we will discontinue Lipitor .  Elevated troponin likely non-ST elevation MI type II.  Troponin peak up to 2000.  Cardiology was consulted.  EKG showed inferolateral T wave inversion.  Likely demand ischemia.  Cardiology with an impression that she is unlikely to be a good candidate for aggressive cardiac intervention.  New pancreatic mass with concerns for metastatic disease. Palliative care on board for goals of care.  Seen by oncology and at this time oncology with an impression that chemotherapy might not improve her quality of life.  At this time plan is to proceed with comfort care/residential hospice.  Paroxysmal atrial fibrillation with splenic infarcts:  CHA2DS2/VAS    5 >= 2 Points: High Risk.  Was on Xarelto  at home.  Cardiology was consulted and patient is not a good candidate for anticoagulation due to coexistent concerns for petechial hemorrhage in the brain.  MRI of the brain did not show any evidence of hemorrhage in the brain.  Was initially on IV heparin .  Palliative care followed the patient during hospitalization and at this time plan is to transition to comfort care/residential hospice.  No plans for anticoagulation.    Hyperlipidemia DC statins  Nutrition.  On feeding supplements.  Comfort feeds.  Goals of care.  Palliative care has seen the patient.  Patient is DNR/DNI.  Patient with metastatic pancreatic cancer with multiple comorbidities.  Family has decided for residential hospice and comfort care at this time.  Had a long conversation with the patient's daughter and some therapy.  DVT prophylaxis:    Disposition: Plan for residential hospice.  Initiated comfort  care.    Status is: Inpatient Remains inpatient appropriate because: Comfort care, need for residential hospice    Code Status:     Code Status: Do not attempt resuscitation (DNR) - Comfort care  Family Communication: Spoke with the patient's daughter and 2 sons at bedside.  Consultants: Neurology  Procedures: None  Anti-infectives:  None  Anti-infectives (From admission, onward)    None        Subjective: Today, patient was seen and examined at bedside.  Complains of pain in the abdomen.  Patient's family at bedside.  Appears uncomfortable.  Had long conversation with family.  Goals of care discussed and plan to proceed with comfort care.  Objective: Vitals:   10/19/24 0324 10/19/24 0752  BP: 118/65 (!) 159/81  Pulse: 87 94  Resp: 18 19  Temp: 98 F (36.7 C) 97.8 F (36.6 C)  SpO2: 98% 98%    Intake/Output Summary (Last 24 hours) at 10/19/2024 1027 Last data filed at 10/19/2024 1000 Gross per 24 hour  Intake 240 ml  Output 1310 ml  Net -1070 ml   Filed Weights   10/14/24 1343  Weight: 73.9 kg   Body mass index is 27.98 kg/m.   Physical Exam:  GENERAL: Patient is alert awake, in moderate distress due to pain, HENT: No scleral pallor or icterus. Pupils equally reactive to light. Oral mucosa is dry. NECK: is supple, no gross swelling noted. CHEST: Clear to auscultation. No crackles or wheezes.   CVS: S1 and S2 heard, no murmur. Regular rate and rhythm.  ABDOMEN: Soft, non-tender, bowel sounds are present. EXTREMITIES: Trace lower extremity edema CNS: Cranial nerves are intact.  Generalized weakness noted SKIN: warm and dry without rashes.  Data Review: I have personally reviewed the following laboratory data and studies,  CBC: Recent Labs  Lab 10/14/24 1353 10/14/24 1407 10/15/24 0105  WBC 9.8  --  10.3  NEUTROABS 7.4  --   --   HGB 14.3 14.6 12.8  HCT 42.0 43.0 37.8  MCV 89.4  --  90.2  PLT 125*  --  110*   Basic Metabolic  Panel: Recent Labs  Lab 10/14/24 1353 10/14/24 1407 10/15/24 0105  NA 139 140 138  K 3.7 3.7 3.6  CL 104 103 103  CO2 20*  --  27  GLUCOSE 110* 119* 108*  BUN 10 11 9   CREATININE 0.80 0.80 0.86  CALCIUM  9.3  --  9.0   Liver Function Tests: Recent Labs  Lab 10/14/24 1353 10/15/24 0105  AST 48* 42*  ALT 47* 38  ALKPHOS 151* 131*  BILITOT 1.2 1.4*  PROT 6.9 6.4*  ALBUMIN 3.5 3.3*   Recent Labs  Lab 10/14/24 1700  LIPASE 23   Recent Labs  Lab 10/14/24 1346  AMMONIA <13   Cardiac Enzymes: No results for input(s): CKTOTAL, CKMB, CKMBINDEX, TROPONINI in the last 168 hours. BNP (last 3 results) Recent Labs    10/15/24 0803  BNP 378.8*    ProBNP (last 3 results) No results for input(s): PROBNP in the last 8760 hours.  CBG: Recent Labs  Lab 10/14/24 1337 10/17/24 1544  GLUCAP 128* 124*   Recent Results (from the past 240 hours)  Blood Culture (routine  x 2)     Status: None   Collection Time: 10/14/24  1:46 PM   Specimen: BLOOD  Result Value Ref Range Status   Specimen Description BLOOD RIGHT ANTECUBITAL  Final   Special Requests   Final    BOTTLES DRAWN AEROBIC AND ANAEROBIC Blood Culture results may not be optimal due to an inadequate volume of blood received in culture bottles   Culture   Final    NO GROWTH 5 DAYS Performed at [facility], [address] [place], [place] [address]    Report Status 10/19/2024 FINAL  Final  Blood Culture (routine x 2)     Status: None   Collection Time: 10/14/24  1:51 PM   Specimen: BLOOD  Result Value Ref Range Status   Specimen Description BLOOD LEFT ANTECUBITAL  Final   Special Requests   Final    BOTTLES DRAWN AEROBIC AND ANAEROBIC Blood Culture adequate volume   Culture   Final    NO GROWTH 5 DAYS Performed at [facility], [address] [place], [place] [address]    Report Status 10/19/2024 FINAL  Final  Resp panel by RT-PCR (RSV, Flu A&B, Covid) Anterior Nasal Swab     Status:  None   Collection Time: 10/14/24  1:53 PM   Specimen: Anterior Nasal Swab  Result Value Ref Range Status   SARS Coronavirus 2 by RT PCR NEGATIVE NEGATIVE Final   Influenza A by PCR NEGATIVE NEGATIVE Final   Influenza B by PCR NEGATIVE NEGATIVE Final    Comment: (NOTE) The Xpert Xpress SARS-CoV-2/FLU/RSV plus assay is intended as an aid in the diagnosis of influenza from Nasopharyngeal swab specimens and should not be used as a sole basis for treatment. Nasal washings and aspirates are unacceptable for Xpert Xpress SARS-CoV-2/FLU/RSV testing.  Fact Sheet for Patients: bloggercourse.com  Fact Sheet for Healthcare Providers: seriousbroker.it  This test is not yet approved or cleared by the [place]  FDA and has been authorized for detection and/or diagnosis of SARS-CoV-2 by FDA under an Emergency Use Authorization (EUA). This EUA will remain in effect (meaning this test can be used) for the duration of the COVID-19 declaration under Section 564(b)(1) of the Act, 21 U.S.C. section 360bbb-3(b)(1), unless the authorization is terminated or revoked.     Resp Syncytial Virus by PCR NEGATIVE NEGATIVE Final    Comment: (NOTE) Fact Sheet for Patients: bloggercourse.com  Fact Sheet for Healthcare Providers: seriousbroker.it  This test is not yet approved or cleared by the [place]  FDA and has been authorized for detection and/or diagnosis of SARS-CoV-2 by FDA under an Emergency Use Authorization (EUA). This EUA will remain in effect (meaning this test can be used) for the duration of the COVID-19 declaration under Section 564(b)(1) of the Act, 21 U.S.C. section 360bbb-3(b)(1), unless the authorization is terminated or revoked.  Performed at [facility], [address], [place], [place] [address]      Studies: No results found.     Vernal Alstrom, MD  [facility] 10/19/2024  If 7PM-7AM, please contact night-coverage

## 2024-10-20 DIAGNOSIS — R451 Restlessness and agitation: Secondary | ICD-10-CM

## 2024-10-20 DIAGNOSIS — I639 Cerebral infarction, unspecified: Secondary | ICD-10-CM | POA: Diagnosis not present

## 2024-10-20 DIAGNOSIS — R52 Pain, unspecified: Secondary | ICD-10-CM

## 2024-10-20 MED ORDER — OXYCODONE-ACETAMINOPHEN 5-325 MG PO TABS: 1.0000 | ORAL_TABLET | Freq: Four times a day (QID) | ORAL | Status: DC | PRN

## 2024-10-20 MED ORDER — BISACODYL 10 MG RE SUPP: 10.0000 mg | Freq: Every day | RECTAL | Status: DC | PRN

## 2024-10-20 MED ORDER — FENTANYL CITRATE (PF) 50 MCG/ML IJ SOSY
25.0000 ug | PREFILLED_SYRINGE | Freq: Four times a day (QID) | INTRAMUSCULAR | Status: DC
  Administered 2024-10-20: 25 ug via INTRAVENOUS
  Filled 2024-10-20: qty 1

## 2024-10-20 MED ORDER — LIDOCAINE 5 % EX PTCH: 1.0000 | MEDICATED_PATCH | CUTANEOUS | Status: DC

## 2024-10-20 MED ORDER — FENTANYL CITRATE (PF) 50 MCG/ML IJ SOSY: 25.0000 ug | PREFILLED_SYRINGE | INTRAMUSCULAR | Status: DC | PRN

## 2024-10-20 MED ORDER — GLYCOPYRROLATE 1 MG PO TABS: 1.0000 mg | ORAL_TABLET | ORAL | Status: DC | PRN

## 2024-10-20 MED ORDER — LORAZEPAM 2 MG/ML IJ SOLN: 1.0000 mg | Freq: Four times a day (QID) | INTRAMUSCULAR | Status: DC

## 2024-10-20 MED ORDER — ACETAMINOPHEN 325 MG PO TABS: 650.0000 mg | ORAL_TABLET | Freq: Four times a day (QID) | ORAL | Status: DC | PRN

## 2024-10-20 MED ORDER — ONDANSETRON 4 MG PO TBDP: 4.0000 mg | ORAL_TABLET | Freq: Four times a day (QID) | ORAL | Status: DC | PRN

## 2024-10-20 MED ORDER — ENSURE PLUS HIGH PROTEIN PO LIQD: 237.0000 mL | Freq: Two times a day (BID) | ORAL | Status: DC

## 2024-10-20 MED ORDER — LORAZEPAM 2 MG/ML IJ SOLN
1.0000 mg | Freq: Four times a day (QID) | INTRAMUSCULAR | Status: DC
  Administered 2024-10-20: 1 mg via INTRAVENOUS
  Filled 2024-10-20: qty 1

## 2024-10-20 NOTE — TOC Transition Note (Signed)
 Transition of Care [facility]) - Discharge Note   Patient Details  Name: [name] [name] MRN: [id number] Date of Birth: 20-Dec-1945  [facility] CM/SW Contact:  Gwenn Julien [place], [place] Phone Number: 10/20/2024, 9:46 AM   Clinical Narrative: Per Cheri with [facility], pt has been accepted to [facility] and they are prepared to admit today. Pt's dtr agreeable and has completed admission consents. RN provided with number for report and PTAR arranged for transport. SW signing off at dc.   Julien Gwenn, MSW, LCSW [phone] (coverage)      Final next level of care: Hospice Medical Facility Barriers to Discharge: Barriers Resolved   Patient Goals and CMS Choice Patient states their goals for this hospitalization and ongoing recovery are:: patient unable to participate in goal setting, not fully oriented CMS Medicare.gov Compare Post Acute Care list provided to:: Patient Represenative (must comment) Choice offered to / list presented to : Adult Children      Discharge Placement                Patient to be transferred to facility by: PTAR Name of family member notified: Monica/Dtr Patient and family notified of of transfer: 10/20/24  Discharge Plan and Services Additional resources added to the After Visit Summary for       Post Acute Care Choice: Hospice                               Social Drivers of Health (SDOH) Interventions SDOH Screenings   Food Insecurity: Unknown (10/11/2024)  Housing: Unknown (10/11/2024)  Transportation Needs: No Transportation Needs (10/11/2024)  Utilities: Not At Risk (10/11/2024)  Alcohol Screen: Low Risk  (04/14/2024)  Depression (PHQ2-9): Low Risk  (10/11/2024)  Financial Resource Strain: Low Risk  (04/14/2024)  Physical Activity: Insufficiently Active (04/14/2024)  Social Connections: Moderately Integrated (10/08/2024)  Stress: No Stress Concern Present (04/14/2024)  Tobacco Use: Low  Risk  (10/14/2024)  Health Literacy: Adequate Health Literacy (04/14/2024)     Readmission Risk Interventions     No data to display

## 2024-10-20 NOTE — Progress Notes (Signed)
   This pt was referred to hospice care and has been reviewed with my MD updated as well. She remains [facility] for [facility] I offered a bed to the daughter today and she has accepted. Paperwork has been completed and we can accept the pt first available. Updated the hospital team and they will proceed with d/c. Magdalena Berber RN  [phone]

## 2024-10-20 NOTE — Discharge Summary (Signed)
 Physician Discharge Summary  SIGNE TACKITT FMW:[id number] DOB: [birth year] DOA: 10/14/2024  PCP: Ozell Heron [name], MD  Admit date: 10/14/2024 Discharge date: 10/20/2024  Admitted From: Home  Discharge disposition: Residential hospice   Recommendations for Outpatient Follow-Up:   Further care as per residential hospice.   Discharge Diagnosis:   Principal Problem:   Acute CVA (cerebrovascular accident) (HCC) Active Problems:   HLD (hyperlipidemia)   Paroxysmal atrial fibrillation (HCC)   Acute encephalopathy   Renal infarct   Splenic infarct   Pancreatic cancer metastasized to liver [facility])    Discharge Condition: Improved.  Diet recommendation: Regular/comfort feeds.  Wound care: None.  Code status: DNR   History of Present Illness:   [name] [name] is a 78 y.o. female with past medical history  of essential hypertension, atrial fibrillation and DVT on Xarelto  , hyperlipidemia intolerant of statins presented to the  hospital with altered mental status.  Of note, patient was recently admitted to hospital 1 week prior to this presentation for left-sided weakness, heaviness of the left extremities with flashes in her eyes and was seen by neurology, who was diagnosed of embolic CVA and was discharged home in stable condition. After discharge, patient had decreased activity with decreased oral intake, generalized weakness and confusion and was brought into the hospital. In the ED, blood pressure was slightly elevated.  Labs were notable for INR of 2.1.  Troponin elevated to 1797 initially.  COVID influenza and RSV was negative.  Ammonia level was within normal limits.  Urinalysis showed trace leukocytes only.  Chest x-ray CTA of the chest did not show any acute abnormality.  CT head showed recent infarction but focal petechial hemorrhage.  CT scan of the abdomen and pelvis showed 1.5 x 2 cm hypoenhancing mass of the pancreas consistent with neoplasm and multiple liver lesions..  Patient was then considered for admission to the hospital for further evaluation and treatment.    Hospital Course:   Following conditions were addressed during hospitalization as listed below,  Altered mental status  metabolic encephalopathy. acute and subacute embolic CVA: Concern for intracerebral hemorrhage.  Previous MRI of the brain showed extensive bilateral cerebral CVA.   LDL of 183.  Hemoglobin A1c of 5.4.  Review of recent MRI angiogram of the head did not show any significant intracranial stenosis.  A  Repeat MRI brain this admission showed innumerable foci of early to subacute ischemia.  Family has opted for comfort care at this time.   Hyperlipidemia: Recent LDL of 183.  Transitioned to comfort care    Elevated troponin likely non-ST elevation MI type II.  Troponin peak up to 2000.  Cardiology was consulted.  EKG showed inferolateral T wave inversion.  Likely demand ischemia.  Cardiology with an impression that she is unlikely to be a good candidate for aggressive cardiac intervention.   New pancreatic mass with concerns for metastatic disease. Palliative care was consulted.  Seen by oncology and at this time oncology with an impression that chemotherapy might not improve her quality of life.  At this time plan is to proceed with comfort care/residential hospice.   Paroxysmal atrial fibrillation with splenic infarcts:  CHA2DS2/VAS    5 >= 2 Points: High Risk.  Was on Xarelto  at home.    MRI of the brain did not show any evidence of hemorrhage in the brain.  Was initially on IV heparin .  Palliative care followed the patient during hospitalization and at this time plan is to transition to comfort  care/residential hospice.  No plans for anticoagulation.     Hyperlipidemia    Nutrition.  On feeding supplements.  Comfort feeds.   Goals of care.  Palliative care followed the patient during hospitalization..  Patient is DNR/DNI.  Patient with metastatic pancreatic cancer with  multiple comorbidities.  Family has decided for residential hospice and comfort care at this time.    Disposition.  At this time, patient is stable for disposition to residential hospice  Medical Consultants:   Palliative care Oncology Neurology  Procedures:    None Subjective:   Today, patient was seen and examined at bedside.  Patient appears to be comfortable on comfort care.  Family at bedside.  Discharge Exam:   Vitals:   10/19/24 1946 10/20/24 0754  BP: (!) 119/99 (!) 93/56  Pulse: (!) 105 86  Resp: 18 17  Temp: 99.3 F (37.4 C) 98.6 F (37 C)  SpO2: 100% 96%   Vitals:   10/19/24 0324 10/19/24 0752 10/19/24 1946 10/20/24 0754  BP: 118/65 (!) 159/81 (!) 119/99 (!) 93/56  Pulse: 87 94 (!) 105 86  Resp: 18 19 18 17   Temp: 98 F (36.7 C) 97.8 F (36.6 C) 99.3 F (37.4 C) 98.6 F (37 C)  TempSrc: Oral Oral Oral Oral  SpO2: 98% 98% 100% 96%  Weight:      Height:        General: Mildly somnolent but comfortable, elderly female, HENT: pupils equally reacting to light,  No scleral pallor or icterus noted. Oral mucosa is moist.  Chest: Diminished breath sounds bilaterally.SABRA No crackles or wheezes.  CVS: S1 &S2 heard. No murmur.  Regular rate and rhythm. Abdomen: Soft, nontender, nondistended.  Bowel sounds are heard.   Extremities: No cyanosis, clubbing with trace lower extremity edema.  Peripheral pulses are palpable. Psych: Alert, awake and oriented, normal mood CNS:  No cranial nerve deficits.  Generalized weakness. Skin: Warm and dry.  No rashes noted.  The results of significant diagnostics from this hospitalization (including imaging, microbiology, ancillary and laboratory) are listed below for reference.     Diagnostic Studies:   MR BRAIN WO CONTRAST Result Date: 10/14/2024 EXAM: MRI BRAIN WITHOUT CONTRAST 10/14/2024 06:49:30 PM TECHNIQUE: Multiplanar multisequence MRI of the head/brain was performed without the administration of intravenous contrast.  COMPARISON: 10/08/2024 CLINICAL HISTORY: Neuro deficit, acute, stroke suspected. FINDINGS: BRAIN AND VENTRICLES: There are innumerable foci of abnormal diffusion restriction scattered throughout both cerebral and cerebellar hemispheres. The number of lesions has greatly increased compared to the prior study. There are a few scattered chronic microhemorrhages. No acute hemorrhage. No mass. No midline shift. No hydrocephalus. The sella is unremarkable. Normal flow voids. ORBITS: No acute abnormality. SINUSES AND MASTOIDS: No acute abnormality. BONES AND SOFT TISSUES: Normal marrow signal. Numerous cutaneous and subcutaneous lesions of the scalp. IMPRESSION: 1. Innumerable foci of acute/early subacute ischemia, with a greatly increased number of lesions compared to the prior study. 2. No acute hemorrhage. 3. 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 Electronically signed by: Franky Stanford MD 10/14/2024 07:13 PM EST RP Workstation: [id number]   CT Head Wo Contrast Result Date: 10/14/2024 CLINICAL DATA:  Altered level of consciousness EXAM: CT HEAD WITHOUT CONTRAST TECHNIQUE: Contiguous axial images were obtained from the base of the skull through the vertex without intravenous contrast. RADIATION DOSE REDUCTION: This exam was performed according to the departmental dose-optimization program which includes automated exposure control, adjustment of the mA and/or kV according to patient size and/or use of iterative reconstruction technique. COMPARISON:  10/08/2024 FINDINGS: Brain: There are scattered cortical hypodensities compatible with the multifocal acute infarcts seen on recent MRI. These are most pronounced within the right cerebellar hemisphere and left frontal parietal cortex. Within the left frontal parietal cortical infarct image 26/5 there is a 3 mm focus of petechial hemorrhage which has developed in the interim. No new areas of infarct are identified. The lateral ventricles and midline structures are  otherwise unremarkable. No mass effect. Vascular: Stable atherosclerosis.  No hyperdense vessel. Skull: Multiple hyperdense cutaneous lesions are again identified, which may reflect numerous sebaceous cyst or neurofibromas. The calvarium is unremarkable. Sinuses/Orbits: Mild polypoid mucosal thickening within the right maxillary sinus. Remaining paranasal sinuses are clear. Other: None. IMPRESSION: 1. Scattered hypodensities within the bilateral cerebral cortices and right cerebellar hemisphere compatible with known acute cortical infarcts identified on the 10/08/2024 MRI. 2. Focal petechial hemorrhage within the left frontal parietal cortical infarct bed as above, measuring 3 mm. No mass effect. Critical Value/emergent results were called by telephone at the time of interpretation on 10/14/2024 at 3:52 pm to provider [facility] , who verbally acknowledged these results. Electronically Signed   By: Ozell Daring M.D.   On: 10/14/2024 15:57   CT ABDOMEN PELVIS W CONTRAST Result Date: 10/14/2024 CLINICAL DATA:  Altered mental status. Concern for pulmonary disease. Abdominal pain. EXAM: CT ANGIOGRAPHY CHEST CT ABDOMEN AND PELVIS WITH CONTRAST TECHNIQUE: Multidetector CT imaging of the chest was performed using the standard protocol during bolus administration of intravenous contrast. Multiplanar CT image reconstructions and MIPs were obtained to evaluate the vascular anatomy. Multidetector CT imaging of the abdomen and pelvis was performed using the standard protocol during bolus administration of intravenous contrast. RADIATION DOSE REDUCTION: This exam was performed according to the departmental dose-optimization program which includes automated exposure control, adjustment of the mA and/or kV according to patient size and/or use of iterative reconstruction technique. CONTRAST:  75mL OMNIPAQUE  IOHEXOL  350 MG/ML SOLN COMPARISON:  Chest radiograph dated 10/14/2024. FINDINGS: CTA CHEST FINDINGS Cardiovascular:  There is no cardiomegaly or pericardial effusion. Mild atherosclerotic calcification of the thoracic aorta. No aneurysmal dilatation or dissection. The origins of the great vessels of the aortic arch appear patent. No pulmonary artery embolus identified. Mediastinum/Nodes: No hilar or mediastinal adenopathy. The esophagus is grossly unremarkable. No mediastinal fluid collection. Lungs/Pleura: Faint diffuse hazy density throughout the lungs with mosaic attenuation may represent areas of air trapping and related to underlying small airway versus small vessel disease. No focal consolidation, pleural effusion, pneumothorax. The central airways are patent. Musculoskeletal: Degenerative changes of spine. No acute osseous pathology. Review of the MIP images confirms the above findings. CT ABDOMEN and PELVIS FINDINGS No intra-abdominal free air or free fluid. Hepatobiliary: Multiple hepatic hypoenhancing lesions measure up to 18 mm in the dome of the liver consistent with metastatic disease. Liver abscesses are less likely. Mild biliary dilatation, post cholecystectomy. Pancreas: There is a 1.5 x 2 cm hypoenhancing mass in the body of the pancreas consistent with primary pancreatic neoplasm. There is atrophy of the distal body of the pancreas. Spleen: Wedge shaped area of hypoenhancement involving the upper pole of the spleen most consistent with infarct. Adrenals/Urinary Tract: The adrenal glands unremarkable. Bilateral renal wedge-shaped hypoenhancing areas suspicious for infarct. An area of hypoenhancement involving the inferior pole of the right kidney may represent infarct or pyelonephritis. Correlation with urinalysis recommended. There is no hydronephrosis on either side. There is symmetric excretion of contrast by both kidneys. The visualized ureters and urinary bladder appear unremarkable. Stomach/Bowel:  There is sigmoid diverticulosis. There is no bowel obstruction or active inflammation. The appendix is normal.  Vascular/Lymphatic: Moderate aortoiliac atherosclerotic disease. The IVC is unremarkable. No portal venous gas. There is no adenopathy. Reproductive: The uterus is retroverted or retroflexed. Anterior uterine fibroid noted. No suspicious adnexal masses. Other: None Musculoskeletal: Osteopenia with degenerative changes spine. No acute osseous pathology. Review of the MIP images confirms the above findings. IMPRESSION: 1. No acute intrathoracic pathology. No pulmonary artery embolus identified. 2. A 1.5 x 2 cm hypoenhancing mass in the body of the pancreas consistent with primary pancreatic neoplasm. 3. Multiple hepatic hypoenhancing lesions consistent with metastatic disease. 4. Splenic and bilateral renal wedge-shaped hypoenhancing areas suspicious for infarcts. An area of hypoenhancement involving the inferior pole of the right kidney may represent infarct or pyelonephritis. Correlation with urinalysis recommended. 5. Sigmoid diverticulosis. No bowel obstruction. Normal appendix. 6.  Aortic Atherosclerosis (ICD10-I70.0). Electronically Signed   By: Vanetta Chou M.D.   On: 10/14/2024 15:51   CT Angio Chest PE W and/or Wo Contrast Result Date: 10/14/2024 CLINICAL DATA:  Altered mental status. Concern for pulmonary disease. Abdominal pain. EXAM: CT ANGIOGRAPHY CHEST CT ABDOMEN AND PELVIS WITH CONTRAST TECHNIQUE: Multidetector CT imaging of the chest was performed using the standard protocol during bolus administration of intravenous contrast. Multiplanar CT image reconstructions and MIPs were obtained to evaluate the vascular anatomy. Multidetector CT imaging of the abdomen and pelvis was performed using the standard protocol during bolus administration of intravenous contrast. RADIATION DOSE REDUCTION: This exam was performed according to the departmental dose-optimization program which includes automated exposure control, adjustment of the mA and/or kV according to patient size and/or use of iterative  reconstruction technique. CONTRAST:  75mL OMNIPAQUE  IOHEXOL  350 MG/ML SOLN COMPARISON:  Chest radiograph dated 10/14/2024. FINDINGS: CTA CHEST FINDINGS Cardiovascular: There is no cardiomegaly or pericardial effusion. Mild atherosclerotic calcification of the thoracic aorta. No aneurysmal dilatation or dissection. The origins of the great vessels of the aortic arch appear patent. No pulmonary artery embolus identified. Mediastinum/Nodes: No hilar or mediastinal adenopathy. The esophagus is grossly unremarkable. No mediastinal fluid collection. Lungs/Pleura: Faint diffuse hazy density throughout the lungs with mosaic attenuation may represent areas of air trapping and related to underlying small airway versus small vessel disease. No focal consolidation, pleural effusion, pneumothorax. The central airways are patent. Musculoskeletal: Degenerative changes of spine. No acute osseous pathology. Review of the MIP images confirms the above findings. CT ABDOMEN and PELVIS FINDINGS No intra-abdominal free air or free fluid. Hepatobiliary: Multiple hepatic hypoenhancing lesions measure up to 18 mm in the dome of the liver consistent with metastatic disease. Liver abscesses are less likely. Mild biliary dilatation, post cholecystectomy. Pancreas: There is a 1.5 x 2 cm hypoenhancing mass in the body of the pancreas consistent with primary pancreatic neoplasm. There is atrophy of the distal body of the pancreas. Spleen: Wedge shaped area of hypoenhancement involving the upper pole of the spleen most consistent with infarct. Adrenals/Urinary Tract: The adrenal glands unremarkable. Bilateral renal wedge-shaped hypoenhancing areas suspicious for infarct. An area of hypoenhancement involving the inferior pole of the right kidney may represent infarct or pyelonephritis. Correlation with urinalysis recommended. There is no hydronephrosis on either side. There is symmetric excretion of contrast by both kidneys. The visualized ureters  and urinary bladder appear unremarkable. Stomach/Bowel: There is sigmoid diverticulosis. There is no bowel obstruction or active inflammation. The appendix is normal. Vascular/Lymphatic: Moderate aortoiliac atherosclerotic disease. The IVC is unremarkable. No portal venous gas. There is no adenopathy.  Reproductive: The uterus is retroverted or retroflexed. Anterior uterine fibroid noted. No suspicious adnexal masses. Other: None Musculoskeletal: Osteopenia with degenerative changes spine. No acute osseous pathology. Review of the MIP images confirms the above findings. IMPRESSION: 1. No acute intrathoracic pathology. No pulmonary artery embolus identified. 2. A 1.5 x 2 cm hypoenhancing mass in the body of the pancreas consistent with primary pancreatic neoplasm. 3. Multiple hepatic hypoenhancing lesions consistent with metastatic disease. 4. Splenic and bilateral renal wedge-shaped hypoenhancing areas suspicious for infarcts. An area of hypoenhancement involving the inferior pole of the right kidney may represent infarct or pyelonephritis. Correlation with urinalysis recommended. 5. Sigmoid diverticulosis. No bowel obstruction. Normal appendix. 6.  Aortic Atherosclerosis (ICD10-I70.0). Electronically Signed   By: Vanetta Chou M.D.   On: 10/14/2024 15:51   DG Chest Port 1 View Result Date: 10/14/2024 CLINICAL DATA:  Questionable sepsis - evaluate for abnormality EXAM: PORTABLE CHEST 1 VIEW COMPARISON:  May 06, 2022 FINDINGS: The cardiomediastinal silhouette is unchanged in contour.Atherosclerotic calcifications. No pleural effusion. No pneumothorax. No acute pleuroparenchymal abnormality. IMPRESSION: No acute cardiopulmonary abnormality. Electronically Signed   By: Corean Salter M.D.   On: 10/14/2024 14:10     Labs:   Basic Metabolic Panel: Recent Labs  Lab 10/14/24 1353 10/14/24 1407 10/15/24 0105  NA 139 140 138  K 3.7 3.7 3.6  CL 104 103 103  CO2 20*  --  27  GLUCOSE 110* 119* 108*   BUN 10 11 9   CREATININE 0.80 0.80 0.86  CALCIUM  9.3  --  9.0   GFR Estimated Creatinine Clearance: 52.3 mL/min (by C-G formula based on SCr of 0.86 mg/dL). Liver Function Tests: Recent Labs  Lab 10/14/24 1353 10/15/24 0105  AST 48* 42*  ALT 47* 38  ALKPHOS 151* 131*  BILITOT 1.2 1.4*  PROT 6.9 6.4*  ALBUMIN 3.5 3.3*   Recent Labs  Lab 10/14/24 1700  LIPASE 23   Recent Labs  Lab 10/14/24 1346  AMMONIA <13   Coagulation profile Recent Labs  Lab 10/14/24 1353  INR 2.1*    CBC: Recent Labs  Lab 10/14/24 1353 10/14/24 1407 10/15/24 0105  WBC 9.8  --  10.3  NEUTROABS 7.4  --   --   HGB 14.3 14.6 12.8  HCT 42.0 43.0 37.8  MCV 89.4  --  90.2  PLT 125*  --  110*   Cardiac Enzymes: No results for input(s): CKTOTAL, CKMB, CKMBINDEX, TROPONINI in the last 168 hours. BNP: Invalid input(s): POCBNP CBG: Recent Labs  Lab 10/14/24 1337 10/17/24 1544  GLUCAP 128* 124*   D-Dimer No results for input(s): DDIMER in the last 72 hours. Hgb A1c No results for input(s): HGBA1C in the last 72 hours. Lipid Profile No results for input(s): CHOL, HDL, LDLCALC, TRIG, CHOLHDL, LDLDIRECT in the last 72 hours. Thyroid  function studies No results for input(s): TSH, T4TOTAL, T3FREE, THYROIDAB in the last 72 hours.  Invalid input(s): FREET3 Anemia work up No results for input(s): VITAMINB12, FOLATE, FERRITIN, TIBC, IRON, RETICCTPCT in the last 72 hours. Microbiology Recent Results (from the past 240 hours)  Blood Culture (routine x 2)     Status: None (Preliminary result)   Collection Time: 10/14/24  1:46 PM   Specimen: BLOOD  Result Value Ref Range Status   Specimen Description BLOOD RIGHT ANTECUBITAL  Final   Special Requests   Final    BOTTLES DRAWN AEROBIC AND ANAEROBIC Blood Culture results may not be optimal due to an inadequate volume of blood received in  culture bottles   Culture  Setup Time   Final    GRAM  POSITIVE RODS ANAEROBIC BOTTLE ONLY CRITICAL RESULT CALLED TO, READ BACK BY AND VERIFIED WITH: PHARMD V. LABI [id number] @1535  FH Performed at [facility], [address] [place], [place] [address]    Culture   Final    GRAM POSITIVE RODS CORRECTED ON 11/27 AT 1533: PREVIOUSLY REPORTED AS NO GROWTH 5 DAYS   Report Status PENDING  Incomplete  Blood Culture (routine x 2)     Status: None   Collection Time: 10/14/24  1:51 PM   Specimen: BLOOD  Result Value Ref Range Status   Specimen Description BLOOD LEFT ANTECUBITAL  Final   Special Requests   Final    BOTTLES DRAWN AEROBIC AND ANAEROBIC Blood Culture adequate volume   Culture   Final    NO GROWTH 5 DAYS Performed at [facility], [address], [place], [place] [address]    Report Status 10/19/2024 FINAL  Final  Resp panel by RT-PCR (RSV, Flu A&B, Covid) Anterior Nasal Swab     Status: None   Collection Time: 10/14/24  1:53 PM   Specimen: Anterior Nasal Swab  Result Value Ref Range Status   SARS Coronavirus 2 by RT PCR NEGATIVE NEGATIVE Final   Influenza A by PCR NEGATIVE NEGATIVE Final   Influenza B by PCR NEGATIVE NEGATIVE Final    Comment: (NOTE) The Xpert Xpress SARS-CoV-2/FLU/RSV plus assay is intended as an aid in the diagnosis of influenza from Nasopharyngeal swab specimens and should not be used as a sole basis for treatment. Nasal washings and aspirates are unacceptable for Xpert Xpress SARS-CoV-2/FLU/RSV testing.  Fact Sheet for Patients: bloggercourse.com  Fact Sheet for Healthcare Providers: seriousbroker.it  This test is not yet approved or cleared by the [place]  FDA and has been authorized for detection and/or diagnosis of SARS-CoV-2 by FDA under an Emergency Use Authorization (EUA). This EUA will remain in effect (meaning this test can be used) for the duration of the COVID-19 declaration under Section 564(b)(1) of the Act, 21 U.S.C. section  360bbb-3(b)(1), unless the authorization is terminated or revoked.     Resp Syncytial Virus by PCR NEGATIVE NEGATIVE Final    Comment: (NOTE) Fact Sheet for Patients: bloggercourse.com  Fact Sheet for Healthcare Providers: seriousbroker.it  This test is not yet approved or cleared by the [place]  FDA and has been authorized for detection and/or diagnosis of SARS-CoV-2 by FDA under an Emergency Use Authorization (EUA). This EUA will remain in effect (meaning this test can be used) for the duration of the COVID-19 declaration under Section 564(b)(1) of the Act, 21 U.S.C. section 360bbb-3(b)(1), unless the authorization is terminated or revoked.  Performed at [facility], [address] [place], [place] [address]      Discharge Instructions:   Discharge Instructions     Diet general   Complete by: As directed    Discharge instructions   Complete by: As directed    Further care as per residential hospice   Increase activity slowly   Complete by: As directed       Allergies as of 10/20/2024       Reactions   Naproxen Sodium Hives   Neomycin-bacitracin Zn-polymyx Swelling   In ear, where it was applied.    Tetracycline Rash   All over the body   Apixaban Hives   Crestor [rosuvastatin] Other (See Comments)   Joint pain   Lipitor  [  atorvastatin ] Other (See Comments)   Joint pain and weakness.    Advil [ibuprofen] Rash   Other Rash   ALL TAPES-RASH (PT HAS LEAST RXN TO PAPER TAPE)        Medication List     STOP taking these medications    b complex vitamins capsule   Coenzyme Q10 100 MG capsule   diltiazem  30 MG tablet Commonly known as: Cardizem    magnesium  oxide 400 (240 Mg) MG tablet Commonly known as: MAG-OX   MORINGA PO   multivitamin tablet   OVER THE COUNTER MEDICATION   OVER THE COUNTER MEDICATION   OVER THE COUNTER MEDICATION   OVER THE COUNTER MEDICATION   OVER THE  COUNTER MEDICATION   rivaroxaban  20 MG Tabs tablet Commonly known as: Xarelto    simvastatin  40 MG tablet Commonly known as: ZOCOR    vitamin C  1000 MG tablet   VITAMIN K2-VITAMIN D3 PO       TAKE these medications    acetaminophen  325 MG tablet Commonly known as: TYLENOL  Take 2 tablets (650 mg total) by mouth every 6 (six) hours as needed for mild pain (pain score 1-3) or fever (or Fever >/= 101). What changed:  medication strength how much to take reasons to take this   bisacodyl  10 MG suppository Commonly known as: DULCOLAX Place 1 suppository (10 mg total) rectally daily as needed for moderate constipation.   feeding supplement Liqd Take 237 mLs by mouth 2 (two) times daily between meals.   fentaNYL  50 MCG/ML Sosy injection Commonly known as: SUBLIMAZE  Inject 0.5 mLs (25 mcg total) into the vein every 2 (two) hours as needed for severe pain (pain score 7-10).   glycopyrrolate  1 MG tablet Commonly known as: ROBINUL  Take 1 tablet (1 mg total) by mouth every 4 (four) hours as needed (excessive secretions).   lidocaine  5 % Commonly known as: LIDODERM  Place 1 patch onto the skin daily. Remove & Discard patch within 12 hours or as directed by MD Start taking on: October 21, 2024   LORazepam  2 MG/ML injection Commonly known as: ATIVAN  Inject 0.5 mLs (1 mg total) into the vein every 6 (six) hours.   ondansetron  4 MG disintegrating tablet Commonly known as: ZOFRAN -ODT Take 1 tablet (4 mg total) by mouth every 6 (six) hours as needed for nausea.   oxyCODONE -acetaminophen  5-325 MG tablet Commonly known as: PERCOCET/ROXICET Take 1-2 tablets by mouth every 6 (six) hours as needed for moderate pain (pain score 4-6) or severe pain (pain score 7-10).          Time coordinating discharge: 39 minutes  Signed:  Corbin Falck  [facility] 10/20/2024, 9:52 AM

## 2024-10-20 NOTE — Progress Notes (Signed)
 Palliative Medicine Inpatient Follow Up Note HPI: Recent embolic stroke. Now presenting with confusion and found to have small intracerebral hemorrhage. 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 Troponin also significantly elevated with limited options if ongoing intracerebral hemorrhage. MRI brain ordered to further evaluate bleeding.  Not on options for infarcts if ongoing bleeding.  Also with new pancreatic mass suspicious for neoplasm  long-term prognosis is poor.  Family has agreed to DNR/DNI.  I notified them that I would be consulting palliative for ongoing discussions about goals of care while continuing workup given some of her treatment options will likely be limited.   Today's Discussion 10/20/2024  *Please note that this is a verbal dictation therefore any spelling or grammatical errors are due to the Dragon Medical One system interpretation.  I reviewed the chart notes including nursing notes from today, progress notes from today. I also reviewed vital signs, nursing flowsheets, medication administrations record, labs, and imaging.    I met with [name] [name] and her daughter, [name] [name] at bedside this morning. [name] [name] shares that she family met yesterday and determined they would like to pursue a comfort care path and for [name] [name] to go to the inpatient hospice home.   [name] [name] is in quite a bit of distress calling out to her mother and stating the desire to get out of the bed. She is too muscularly weak to do so at the time of assessment. Requested patients RN, [name] [name] help with repositioning. Afterwards spoke to [facility] about adding around the clock medications for agitation. [name] [name] was in agreement and understands that these may make the patient more sleepy.  Reached out to [facility] and [name] [name] to assert the plan for a bed at [facility] [place]. [name] [name] shared later that patient was approved and a bed was open.  Plan for transition to [facility]  this morning.  DNR placed on chart for transit.   Questions and concerns addressed/Palliative Support Provided.   Objective Assessment: Vital Signs Vitals:   10/19/24 1946 10/20/24 0754  BP: (!) 119/99 (!) 93/56  Pulse: (!) 105 86  Resp: 18 17  Temp: 99.3 F (37.4 C) 98.6 F (37 C)  SpO2: 100% 96%    Intake/Output Summary (Last 24 hours) at 10/20/2024 1041 Last data filed at 10/20/2024 0330 Gross per 24 hour  Intake 120 ml  Output 275 ml  Net -155 ml   Last Weight  Most recent update: 10/14/2024  1:43 PM    Weight  73.9 kg (163 lb)            Gen: Elderly Caucasian female HEENT: Dry mucous membranes CV: Regular rate and irregular rhythm  PULM: On room air breathing is even and nonlabored ABD: soft/nontender  EXT: Bilateral lower extremity edema +1 pitting Neuro: Somnolent but arousable   SUMMARY OF RECOMMENDATIONS   DNAR/DNI  Gold DNR on chart signed   Comfort care  Initiate fentanyl  and ativan  Q6H ATC  Additional comfort medications per MAR  No more anticoagulation  Unrestricted visitation  Plan to transition to [facility] today  ______________________________________________________________________________________ [name] [name] Palliative Medicine Team Team Cell Phone: [phone] Please utilize secure chat with additional questions, if there is no response within 30 minutes please call the above phone number  Billing based on MDM: High  Palliative Medicine Team providers are available by phone from 7am to 7pm daily and can be reached through the team cell phone.  Should this patient require  assistance outside of these hours, please call the patient's attending physician.

## 2024-10-20 NOTE — Plan of Care (Signed)

## 2024-10-20 NOTE — TOC Progression Note (Signed)
 Transition of Care [facility]) - Progression Note    Patient Details  Name: [name] [name] MRN: [id number] Date of Birth: Nov 05, 1946  [facility] CM/SW Contact  Corean JAYSON Canary, RN Phone Number: 10/20/2024, 8:17 AM  Clinical Narrative:     Meeting with palliative , family deciding to go to residential hospice with [facility] [place]. Awaiting bed availability, Magdalena Berber from [facility] aware.  Expected Discharge Plan: Hospice Medical Facility Barriers to Discharge: Continued Medical Work up               Expected Discharge Plan and Services     Post Acute Care Choice: Hospice Living arrangements for the past 2 months: Single Family Home                                       Social Drivers of Health (SDOH) Interventions SDOH Screenings   Food Insecurity: Unknown (10/11/2024)  Housing: Unknown (10/11/2024)  Transportation Needs: No Transportation Needs (10/11/2024)  Utilities: Not At Risk (10/11/2024)  Alcohol Screen: Low Risk  (04/14/2024)  Depression (PHQ2-9): Low Risk  (10/11/2024)  Financial Resource Strain: Low Risk  (04/14/2024)  Physical Activity: Insufficiently Active (04/14/2024)  Social Connections: Moderately Integrated (10/08/2024)  Stress: No Stress Concern Present (04/14/2024)  Tobacco Use: Low Risk  (10/14/2024)  Health Literacy: Adequate Health Literacy (04/14/2024)    Readmission Risk Interventions     No data to display

## 2024-10-20 NOTE — Progress Notes (Addendum)
 Pt Dc'd via [facility] at this time.  Pt given PRN IV fentanyl  prior to discharge.  Pt calm, resting at time of discharge  1107-Report given to Sidra, RN at hospice house.  All questions answered

## 2024-10-21 LAB — CULTURE, BLOOD (ROUTINE X 2)

## 2024-10-26 ENCOUNTER — Ambulatory Visit

## 2024-10-26 ENCOUNTER — Ambulatory Visit: Admitting: Physical Therapy

## 2024-10-31 ENCOUNTER — Ambulatory Visit

## 2024-11-02 ENCOUNTER — Ambulatory Visit

## 2024-11-03 ENCOUNTER — Encounter

## 2024-11-07 ENCOUNTER — Ambulatory Visit

## 2024-11-09 ENCOUNTER — Ambulatory Visit

## 2024-11-13 ENCOUNTER — Ambulatory Visit

## 2024-11-23 DEATH — deceased

## 2024-11-30 ENCOUNTER — Ambulatory Visit (HOSPITAL_COMMUNITY)

## 2024-11-30 ENCOUNTER — Ambulatory Visit: Admitting: Student-PharmD
